# Patient Record
Sex: Female | Born: 1975 | Race: Asian | Hispanic: No | Marital: Married | Smoking: Never smoker
Health system: Southern US, Community
[De-identification: ages and names within clinical notes are randomized; demographics above are authoritative.]

## PROBLEM LIST (undated history)

## (undated) DIAGNOSIS — G629 Polyneuropathy, unspecified: Secondary | ICD-10-CM

## (undated) DIAGNOSIS — T7840XA Allergy, unspecified, initial encounter: Secondary | ICD-10-CM

## (undated) DIAGNOSIS — M1990 Unspecified osteoarthritis, unspecified site: Secondary | ICD-10-CM

## (undated) DIAGNOSIS — I10 Essential (primary) hypertension: Secondary | ICD-10-CM

## (undated) DIAGNOSIS — I82409 Acute embolism and thrombosis of unspecified deep veins of unspecified lower extremity: Secondary | ICD-10-CM

## (undated) DIAGNOSIS — E109 Type 1 diabetes mellitus without complications: Secondary | ICD-10-CM

## (undated) DIAGNOSIS — E079 Disorder of thyroid, unspecified: Secondary | ICD-10-CM

## (undated) DIAGNOSIS — E785 Hyperlipidemia, unspecified: Secondary | ICD-10-CM

## (undated) DIAGNOSIS — E559 Vitamin D deficiency, unspecified: Secondary | ICD-10-CM

## (undated) DIAGNOSIS — G43909 Migraine, unspecified, not intractable, without status migrainosus: Secondary | ICD-10-CM

## (undated) HISTORY — DX: Unspecified osteoarthritis, unspecified site: M19.90

## (undated) HISTORY — PX: OTHER SURGICAL HISTORY: SHX169

## (undated) HISTORY — DX: Hyperlipidemia, unspecified: E78.5

## (undated) HISTORY — DX: Disorder of thyroid, unspecified: E07.9

## (undated) HISTORY — DX: Type 1 diabetes mellitus without complications: E10.9

## (undated) HISTORY — DX: Allergy, unspecified, initial encounter: T78.40XA

---

## 2008-09-10 ENCOUNTER — Emergency Department (HOSPITAL_BASED_OUTPATIENT_CLINIC_OR_DEPARTMENT_OTHER): Admission: EM | Admit: 2008-09-10 | Discharge: 2008-09-11 | Payer: Self-pay | Admitting: Emergency Medicine

## 2008-09-11 ENCOUNTER — Ambulatory Visit: Payer: Self-pay | Admitting: Diagnostic Radiology

## 2009-11-29 ENCOUNTER — Emergency Department (HOSPITAL_BASED_OUTPATIENT_CLINIC_OR_DEPARTMENT_OTHER): Admission: EM | Admit: 2009-11-29 | Discharge: 2009-11-29 | Payer: Self-pay | Admitting: Emergency Medicine

## 2010-08-07 ENCOUNTER — Emergency Department (HOSPITAL_BASED_OUTPATIENT_CLINIC_OR_DEPARTMENT_OTHER): Payer: Medicare Other

## 2010-08-07 ENCOUNTER — Emergency Department (HOSPITAL_BASED_OUTPATIENT_CLINIC_OR_DEPARTMENT_OTHER)
Admission: EM | Admit: 2010-08-07 | Discharge: 2010-08-07 | Disposition: A | Discharge status: Home / Self Care | Payer: Medicare Other | Attending: Emergency Medicine | Admitting: Emergency Medicine

## 2010-08-07 DIAGNOSIS — E785 Hyperlipidemia, unspecified: Secondary | ICD-10-CM | POA: Insufficient documentation

## 2010-08-07 DIAGNOSIS — R7309 Other abnormal glucose: Secondary | ICD-10-CM

## 2010-08-07 DIAGNOSIS — R05 Cough: Secondary | ICD-10-CM

## 2010-08-07 DIAGNOSIS — E1169 Type 2 diabetes mellitus with other specified complication: Secondary | ICD-10-CM | POA: Insufficient documentation

## 2010-08-07 DIAGNOSIS — E860 Dehydration: Secondary | ICD-10-CM | POA: Insufficient documentation

## 2010-08-07 DIAGNOSIS — J069 Acute upper respiratory infection, unspecified: Secondary | ICD-10-CM | POA: Insufficient documentation

## 2010-08-07 LAB — DIFFERENTIAL
Basophils Relative: 0 % (ref 0–1)
Eosinophils Relative: 1 % (ref 0–5)
Lymphocytes Relative: 8 % — ABNORMAL LOW (ref 12–46)
Monocytes Absolute: 0.7 10*3/uL (ref 0.1–1.0)
Monocytes Relative: 8 % (ref 3–12)
Neutro Abs: 7.1 10*3/uL (ref 1.7–7.7)

## 2010-08-07 LAB — CBC
HCT: 37.7 % (ref 36.0–46.0)
Hemoglobin: 12.9 g/dL (ref 12.0–15.0)
MCH: 31.9 pg (ref 26.0–34.0)
MCHC: 34.2 g/dL (ref 30.0–36.0)
RDW: 11.3 % — ABNORMAL LOW (ref 11.5–15.5)

## 2010-08-07 LAB — BASIC METABOLIC PANEL
CO2: 27 mEq/L (ref 19–32)
Calcium: 9.3 mg/dL (ref 8.4–10.5)
Chloride: 99 mEq/L (ref 96–112)
GFR calc Af Amer: >60 mL/min (ref >60)
Glucose, Bld: 192 mg/dL — ABNORMAL HIGH (ref 70–99)
Potassium: 3.6 mEq/L (ref 3.5–5.1)
Sodium: 140 mEq/L (ref 135–145)

## 2010-08-07 LAB — URINALYSIS, ROUTINE W REFLEX MICROSCOPIC
Glucose, UA: NEGATIVE mg/dL
Hgb urine dipstick: NEGATIVE
Ketones, ur: NEGATIVE mg/dL
Protein, ur: NEGATIVE mg/dL
Urobilinogen, UA: 1 mg/dL (ref 0.0–1.0)

## 2010-08-09 LAB — BASIC METABOLIC PANEL
CO2: 24 mEq/L (ref 19–32)
Calcium: 8.7 mg/dL (ref 8.4–10.5)
Chloride: 100 mEq/L (ref 96–112)
GFR calc Af Amer: >60 mL/min (ref >60)
Potassium: 4.4 mEq/L (ref 3.5–5.1)
Sodium: 136 mEq/L (ref 135–145)

## 2010-08-09 LAB — URINALYSIS, ROUTINE W REFLEX MICROSCOPIC
Glucose, UA: >1000 mg/dL — AB
Ketones, ur: >80 mg/dL — AB
Leukocytes, UA: NEGATIVE
pH: 6 (ref 5.0–8.0)

## 2010-08-09 LAB — CBC
HCT: 36.4 % (ref 36.0–46.0)
Hemoglobin: 12.3 g/dL (ref 12.0–15.0)
MCH: 32 pg (ref 26.0–34.0)
RBC: 3.83 MIL/uL — ABNORMAL LOW (ref 3.87–5.11)

## 2010-08-09 LAB — DIFFERENTIAL
Eosinophils Absolute: 0.1 10*3/uL (ref 0.0–0.7)
Lymphs Abs: 1.2 10*3/uL (ref 0.7–4.0)
Monocytes Absolute: 0.3 10*3/uL (ref 0.1–1.0)
Monocytes Relative: 4 % (ref 3–12)
Neutrophils Relative %: 79 % — ABNORMAL HIGH (ref 43–77)

## 2010-08-09 LAB — URINE MICROSCOPIC-ADD ON

## 2010-09-02 LAB — URINE MICROSCOPIC-ADD ON

## 2010-09-02 LAB — DIFFERENTIAL
Basophils Relative: 0 % (ref 0–1)
Eosinophils Absolute: 0.3 10*3/uL (ref 0.0–0.7)
Monocytes Absolute: 0.5 10*3/uL (ref 0.1–1.0)
Monocytes Relative: 6 % (ref 3–12)

## 2010-09-02 LAB — CBC
Hemoglobin: 13.3 g/dL (ref 12.0–15.0)
MCHC: 34 g/dL (ref 30.0–36.0)
MCV: 95.9 fL (ref 78.0–100.0)
RBC: 4.07 MIL/uL (ref 3.87–5.11)

## 2010-09-02 LAB — BASIC METABOLIC PANEL
CO2: 27 mEq/L (ref 19–32)
Chloride: 99 mEq/L (ref 96–112)
GFR calc Af Amer: >60 mL/min (ref >60)
Sodium: 135 mEq/L (ref 135–145)

## 2010-09-02 LAB — URINALYSIS, ROUTINE W REFLEX MICROSCOPIC
Glucose, UA: >1000 mg/dL — AB
Hgb urine dipstick: NEGATIVE
Protein, ur: NEGATIVE mg/dL

## 2010-09-02 LAB — GLUCOSE, CAPILLARY: Glucose-Capillary: 312 mg/dL — ABNORMAL HIGH (ref 70–99)

## 2010-09-02 LAB — WET PREP, GENITAL: Trich, Wet Prep: NONE SEEN

## 2012-10-31 ENCOUNTER — Encounter (HOSPITAL_BASED_OUTPATIENT_CLINIC_OR_DEPARTMENT_OTHER): Payer: Self-pay | Admitting: *Deleted

## 2012-10-31 ENCOUNTER — Emergency Department (HOSPITAL_BASED_OUTPATIENT_CLINIC_OR_DEPARTMENT_OTHER)
Admission: EM | Admit: 2012-10-31 | Discharge: 2012-10-31 | Disposition: A | Discharge status: Home / Self Care | Payer: Medicare Other | Attending: Emergency Medicine | Admitting: Emergency Medicine

## 2012-10-31 DIAGNOSIS — Z3202 Encounter for pregnancy test, result negative: Secondary | ICD-10-CM | POA: Insufficient documentation

## 2012-10-31 DIAGNOSIS — Z794 Long term (current) use of insulin: Secondary | ICD-10-CM | POA: Insufficient documentation

## 2012-10-31 DIAGNOSIS — R109 Unspecified abdominal pain: Secondary | ICD-10-CM | POA: Insufficient documentation

## 2012-10-31 DIAGNOSIS — R350 Frequency of micturition: Secondary | ICD-10-CM | POA: Insufficient documentation

## 2012-10-31 DIAGNOSIS — R739 Hyperglycemia, unspecified: Secondary | ICD-10-CM

## 2012-10-31 DIAGNOSIS — Z79899 Other long term (current) drug therapy: Secondary | ICD-10-CM | POA: Insufficient documentation

## 2012-10-31 DIAGNOSIS — E1169 Type 2 diabetes mellitus with other specified complication: Secondary | ICD-10-CM | POA: Insufficient documentation

## 2012-10-31 LAB — CBC WITH DIFFERENTIAL/PLATELET
HCT: 35 % — ABNORMAL LOW (ref 36.0–46.0)
Hemoglobin: 12 g/dL (ref 12.0–15.0)
Lymphocytes Relative: 23 % (ref 12–46)
Monocytes Absolute: 0.5 10*3/uL (ref 0.1–1.0)
Monocytes Relative: 7 % (ref 3–12)
Neutro Abs: 4.8 10*3/uL (ref 1.7–7.7)
WBC: 7 10*3/uL (ref 4.0–10.5)

## 2012-10-31 LAB — BASIC METABOLIC PANEL
BUN: 10 mg/dL (ref 6–23)
CO2: 27 mEq/L (ref 19–32)
Chloride: 100 mEq/L (ref 96–112)
Creatinine, Ser: 0.6 mg/dL (ref 0.50–1.10)

## 2012-10-31 LAB — URINALYSIS, ROUTINE W REFLEX MICROSCOPIC
Bilirubin Urine: NEGATIVE
Hgb urine dipstick: NEGATIVE
Specific Gravity, Urine: 1.041 — ABNORMAL HIGH (ref 1.005–1.030)
Urobilinogen, UA: 1 mg/dL (ref 0.0–1.0)
pH: 8 (ref 5.0–8.0)

## 2012-10-31 LAB — GLUCOSE, CAPILLARY: Glucose-Capillary: 310 mg/dL — ABNORMAL HIGH (ref 70–99)

## 2012-10-31 MED ORDER — SODIUM CHLORIDE 0.9 % IV BOLUS (SEPSIS)
1000.0000 mL | Freq: Once | INTRAVENOUS | Status: AC
  Administered 2012-10-31: 1000 mL via INTRAVENOUS

## 2012-10-31 NOTE — ED Notes (Signed)
CBG - 310 

## 2012-10-31 NOTE — ED Notes (Signed)
Blood sugar is out of control. She has had an insulin pump for over 10 years. Cannot get her sugar lower than 321 with Insulin. She is having abdominal pain, urinary frequency and increased thirst.

## 2012-10-31 NOTE — ED Provider Notes (Signed)
History     CSN: [id number]  Arrival date & time 10/31/12  2002   First MD Initiated Contact with Patient 10/31/12 2051      Chief Complaint  Patient presents with  . Hyperglycemia    (Consider location/radiation/quality/duration/timing/severity/associated sxs/prior treatment) Patient is a 37 y.o. female presenting with hyperglycemia.  Hyperglycemia  Pt with history of DM on an insulin pump is usually well controlled but reports spiking sugar readings recently since travelling here from [place] for her daughter's high school graduation. She states has been in the 500s, improves temporarily but then back up again. She changed her pump site and insulin yesterday but still in the 300s today. She denies any fever, nausea or vomiting but reports occasional cramping lower abdominal pain and increased urination.   Past Medical History  Diagnosis Date  . Diabetes mellitus without complication     History reviewed. No pertinent past surgical history.  No family history on file.  History  Substance Use Topics  . Smoking status: Never Smoker   . Smokeless tobacco: Not on file  . Alcohol Use: No    OB History   Grav Para Term Preterm Abortions TAB SAB Ect Mult Living                  Review of Systems All other systems reviewed and are negative except as noted in HPI.   Allergies  Review of patient's allergies indicates no known allergies.  Home Medications   Current Outpatient Rx  Name  Route  Sig  Dispense  Refill  . GABAPENTIN, PHN, PO   Oral   Take by mouth.         . insulin regular (NOVOLIN R,HUMULIN R) 100 units/mL injection   Subcutaneous   Inject into the skin 3 (three) times daily before meals.           There were no vitals taken for this visit.  Physical Exam  Nursing note and vitals reviewed. Constitutional: She is oriented to person, place, and time. She appears well-developed and well-nourished.  HENT:  Head: Normocephalic and atraumatic.   Eyes: EOM are normal. Pupils are equal, round, and reactive to light.  Neck: Normal range of motion. Neck supple.  Cardiovascular: Normal rate, normal heart sounds and intact distal pulses.   Pulmonary/Chest: Effort normal and breath sounds normal.  Abdominal: Bowel sounds are normal. She exhibits no distension. There is no tenderness.  Musculoskeletal: Normal range of motion. She exhibits no edema and no tenderness.  Neurological: She is alert and oriented to person, place, and time. She has normal strength. No cranial nerve deficit or sensory deficit.  Skin: Skin is warm and dry. No rash noted.  Psychiatric: She has a normal mood and affect.    ED Course  Procedures (including critical care time)  Labs Reviewed  URINALYSIS, ROUTINE W REFLEX MICROSCOPIC - Abnormal; Notable for the following:    Specific Gravity, Urine 1.041 (*)    Glucose, UA >1000 (*)    All other components within normal limits  GLUCOSE, CAPILLARY - Abnormal; Notable for the following:    Glucose-Capillary 310 (*)    All other components within normal limits  CBC WITH DIFFERENTIAL - Abnormal; Notable for the following:    RBC 3.71 (*)    HCT 35.0 (*)    All other components within normal limits  BASIC METABOLIC PANEL - Abnormal; Notable for the following:    Glucose, Bld 301 (*)    All other components  within normal limits  GLUCOSE, CAPILLARY - Abnormal; Notable for the following:    Glucose-Capillary 159 (*)    All other components within normal limits  PREGNANCY, URINE  URINE MICROSCOPIC-ADD ON   No results found.   1. Hyperglycemia       MDM  CBG elevated, no DKA, her urine is concentrated but no UTI. Given IVF and continued insulin pump with good improvement in her CBG reading. Suspect dehydration complicating her diabetes management.         Charles B. Bernette Mayers, MD 10/31/12 2220

## 2012-12-21 ENCOUNTER — Emergency Department (HOSPITAL_BASED_OUTPATIENT_CLINIC_OR_DEPARTMENT_OTHER): Payer: Medicare Other

## 2012-12-21 ENCOUNTER — Emergency Department (HOSPITAL_BASED_OUTPATIENT_CLINIC_OR_DEPARTMENT_OTHER)
Admission: EM | Admit: 2012-12-21 | Discharge: 2012-12-21 | Disposition: A | Discharge status: Home / Self Care | Payer: Medicare Other | Attending: Emergency Medicine | Admitting: Emergency Medicine

## 2012-12-21 ENCOUNTER — Encounter (HOSPITAL_BASED_OUTPATIENT_CLINIC_OR_DEPARTMENT_OTHER): Payer: Self-pay | Admitting: *Deleted

## 2012-12-21 DIAGNOSIS — J3489 Other specified disorders of nose and nasal sinuses: Secondary | ICD-10-CM | POA: Insufficient documentation

## 2012-12-21 DIAGNOSIS — R05 Cough: Secondary | ICD-10-CM | POA: Insufficient documentation

## 2012-12-21 DIAGNOSIS — J329 Chronic sinusitis, unspecified: Secondary | ICD-10-CM | POA: Insufficient documentation

## 2012-12-21 DIAGNOSIS — E119 Type 2 diabetes mellitus without complications: Secondary | ICD-10-CM | POA: Insufficient documentation

## 2012-12-21 DIAGNOSIS — Z794 Long term (current) use of insulin: Secondary | ICD-10-CM | POA: Insufficient documentation

## 2012-12-21 DIAGNOSIS — R059 Cough, unspecified: Secondary | ICD-10-CM | POA: Insufficient documentation

## 2012-12-21 MED ORDER — SULFAMETHOXAZOLE-TRIMETHOPRIM 800-160 MG PO TABS: 1.0000 | ORAL_TABLET | Freq: Two times a day (BID) | ORAL | Status: DC

## 2012-12-21 MED ORDER — TRAMADOL HCL 50 MG PO TABS: 50.0000 mg | ORAL_TABLET | Freq: Four times a day (QID) | ORAL | Status: DC | PRN

## 2012-12-21 MED ORDER — GUAIFENESIN-CODEINE 100-10 MG/5ML PO SOLN: 5.0000 mL | Freq: Three times a day (TID) | ORAL | Status: DC | PRN

## 2012-12-21 NOTE — ED Provider Notes (Signed)
Medical screening examination/treatment/procedure(s) were performed by non-physician practitioner and as supervising physician I was immediately available for consultation/collaboration.  Geoffery Lyons, MD 12/21/12 [phone]

## 2012-12-21 NOTE — ED Notes (Signed)
Non productive cough x 10 days. Worse at night. States she thinks she has a cold. laryngitis.

## 2012-12-21 NOTE — ED Provider Notes (Signed)
CSN: [id number]     Arrival date & time 12/21/12  1611 History     First MD Initiated Contact with Patient 12/21/12 1717     Chief Complaint  Patient presents with  . URI   (Consider location/radiation/quality/duration/timing/severity/associated sxs/prior Treatment) HPI Comments: Patient is a 37 year old female with a past medical history of diabetes who presents with a 10 day history of cough and sinus pressure. Symptoms started gradually and progressively worsened since the onset. Patient has tried OTC cough suppressant without relief. No aggravating/alleviating factors. No other associated symptoms.   Patient is a 37 y.o. female presenting with URI.  URI Presenting symptoms: congestion and cough     Past Medical History  Diagnosis Date  . Diabetes mellitus without complication    History reviewed. No pertinent past surgical history. No family history on file. History  Substance Use Topics  . Smoking status: Never Smoker   . Smokeless tobacco: Not on file  . Alcohol Use: No   OB History   Grav Para Term Preterm Abortions TAB SAB Ect Mult Living                 Review of Systems  HENT: Positive for congestion and sinus pressure.   Respiratory: Positive for cough.   All other systems reviewed and are negative.    Allergies  Review of patient's allergies indicates no known allergies.  Home Medications   Current Outpatient Rx  Name  Route  Sig  Dispense  Refill  . amitriptyline (ELAVIL) 10 MG tablet   Oral   Take 10 mg by mouth at bedtime.         Marland Kitchen GABAPENTIN, PHN, PO   Oral   Take by mouth.         . insulin regular (NOVOLIN R,HUMULIN R) 100 units/mL injection   Subcutaneous   Inject into the skin 3 (three) times daily before meals.          BP 112/59  Pulse 88  Temp(Src) 98.9 F (37.2 C) (Oral)  Resp 20  Ht 4\' 10"  (1.473 m)  Wt 105 lb (47.628 kg)  BMI 21.95 kg/m2  SpO2 100% Physical Exam  Nursing note and vitals reviewed. Constitutional:  She is oriented to person, place, and time. She appears well-developed and well-nourished. No distress.  Patient coughing throughout exam and interview.   HENT:  Head: Normocephalic and atraumatic.  Mouth/Throat: Oropharynx is clear and moist. No oropharyngeal exudate.  Maxillary sinus tenderness to palpation.   Eyes: Conjunctivae and EOM are normal.  Neck: Normal range of motion.  Cardiovascular: Normal rate and regular rhythm.  Exam reveals no gallop and no friction rub.   No murmur heard. Pulmonary/Chest: Effort normal and breath sounds normal. She has no wheezes. She has no rales. She exhibits no tenderness.  Abdominal: Soft. She exhibits no distension. There is no tenderness. There is no rebound.  Musculoskeletal: Normal range of motion.  Lymphadenopathy:    She has no cervical adenopathy.  Neurological: She is alert and oriented to person, place, and time. Coordination normal.  Speech is goal-oriented. Moves limbs without ataxia.   Skin: Skin is warm and dry.  Psychiatric: She has a normal mood and affect. Her behavior is normal.    ED Course   Procedures (including critical care time)  Labs Reviewed - No data to display Dg Chest 2 View  12/21/2012   *RADIOLOGY REPORT*  Clinical Data: Cough and congestion for 10 days  CHEST - 2  VIEW  Comparison: 08/07/2010  Findings: The heart size and mediastinal contours are within normal limits.  Both lungs are clear.  The visualized skeletal structures are unremarkable.  IMPRESSION: Negative exam.   Original Report Authenticated By: Signa Kell, M.D.   1. Sinusitis     MDM  5:49 PM Patient will be treated for sinusitis with bactrim. Vitals stable and patient afebrile. I will also prescribe tramadol for pain and a cough suppressant. Patient is PERC negative. Patient instructed to return with worsening or concerning symptoms.   Emilia Beck, PA-C 12/21/12 1756

## 2013-06-04 DIAGNOSIS — IMO0002 Reserved for concepts with insufficient information to code with codable children: Secondary | ICD-10-CM | POA: Diagnosis not present

## 2013-06-04 DIAGNOSIS — E782 Mixed hyperlipidemia: Secondary | ICD-10-CM | POA: Diagnosis not present

## 2013-06-04 DIAGNOSIS — E1065 Type 1 diabetes mellitus with hyperglycemia: Secondary | ICD-10-CM | POA: Diagnosis not present

## 2013-06-04 DIAGNOSIS — E559 Vitamin D deficiency, unspecified: Secondary | ICD-10-CM | POA: Diagnosis not present

## 2013-06-04 DIAGNOSIS — E1049 Type 1 diabetes mellitus with other diabetic neurological complication: Secondary | ICD-10-CM | POA: Diagnosis not present

## 2013-06-20 ENCOUNTER — Emergency Department (HOSPITAL_BASED_OUTPATIENT_CLINIC_OR_DEPARTMENT_OTHER): Payer: Medicare Other

## 2013-06-20 ENCOUNTER — Emergency Department (HOSPITAL_BASED_OUTPATIENT_CLINIC_OR_DEPARTMENT_OTHER)
Admission: EM | Admit: 2013-06-20 | Discharge: 2013-06-20 | Disposition: A | Discharge status: Home / Self Care | Payer: Medicare Other | Attending: Emergency Medicine | Admitting: Emergency Medicine

## 2013-06-20 ENCOUNTER — Encounter (HOSPITAL_BASED_OUTPATIENT_CLINIC_OR_DEPARTMENT_OTHER): Payer: Self-pay | Admitting: Emergency Medicine

## 2013-06-20 DIAGNOSIS — J209 Acute bronchitis, unspecified: Secondary | ICD-10-CM | POA: Diagnosis not present

## 2013-06-20 DIAGNOSIS — Z3202 Encounter for pregnancy test, result negative: Secondary | ICD-10-CM | POA: Diagnosis not present

## 2013-06-20 DIAGNOSIS — R000 Tachycardia, unspecified: Secondary | ICD-10-CM | POA: Diagnosis not present

## 2013-06-20 DIAGNOSIS — R630 Anorexia: Secondary | ICD-10-CM | POA: Insufficient documentation

## 2013-06-20 DIAGNOSIS — Z794 Long term (current) use of insulin: Secondary | ICD-10-CM | POA: Insufficient documentation

## 2013-06-20 DIAGNOSIS — R0789 Other chest pain: Secondary | ICD-10-CM | POA: Insufficient documentation

## 2013-06-20 DIAGNOSIS — E119 Type 2 diabetes mellitus without complications: Secondary | ICD-10-CM | POA: Insufficient documentation

## 2013-06-20 DIAGNOSIS — J111 Influenza due to unidentified influenza virus with other respiratory manifestations: Secondary | ICD-10-CM | POA: Diagnosis not present

## 2013-06-20 DIAGNOSIS — Z79899 Other long term (current) drug therapy: Secondary | ICD-10-CM | POA: Insufficient documentation

## 2013-06-20 LAB — URINALYSIS, ROUTINE W REFLEX MICROSCOPIC
Bilirubin Urine: NEGATIVE
GLUCOSE, UA: 250 mg/dL — AB
HGB URINE DIPSTICK: NEGATIVE
KETONES UR: NEGATIVE mg/dL
Leukocytes, UA: NEGATIVE
Nitrite: NEGATIVE
PROTEIN: 30 mg/dL — AB
Specific Gravity, Urine: 1.028 (ref 1.005–1.030)
UROBILINOGEN UA: 1 mg/dL (ref 0.0–1.0)
pH: 7.5 (ref 5.0–8.0)

## 2013-06-20 LAB — CBC WITH DIFFERENTIAL/PLATELET
BASOS PCT: 0 % (ref 0–1)
Basophils Absolute: 0 10*3/uL (ref 0.0–0.1)
EOS ABS: 0.1 10*3/uL (ref 0.0–0.7)
EOS PCT: 1 % (ref 0–5)
HCT: 36.3 % (ref 36.0–46.0)
Hemoglobin: 12.1 g/dL (ref 12.0–15.0)
LYMPHS ABS: 0.5 10*3/uL — AB (ref 0.7–4.0)
Lymphocytes Relative: 9 % — ABNORMAL LOW (ref 12–46)
MCH: 31.9 pg (ref 26.0–34.0)
MCHC: 33.3 g/dL (ref 30.0–36.0)
MCV: 95.8 fL (ref 78.0–100.0)
MONOS PCT: 12 % (ref 3–12)
Monocytes Absolute: 0.6 10*3/uL (ref 0.1–1.0)
NEUTROS PCT: 79 % — AB (ref 43–77)
Neutro Abs: 4.4 10*3/uL (ref 1.7–7.7)
PLATELETS: 210 10*3/uL (ref 150–400)
RBC: 3.79 MIL/uL — AB (ref 3.87–5.11)
RDW: 11.9 % (ref 11.5–15.5)
WBC: 5.5 10*3/uL (ref 4.0–10.5)

## 2013-06-20 LAB — URINE MICROSCOPIC-ADD ON

## 2013-06-20 LAB — BASIC METABOLIC PANEL
BUN: 12 mg/dL (ref 6–23)
CALCIUM: 8.4 mg/dL (ref 8.4–10.5)
CO2: 25 mEq/L (ref 19–32)
Chloride: 98 mEq/L (ref 96–112)
Creatinine, Ser: 0.6 mg/dL (ref 0.50–1.10)
GLUCOSE: 272 mg/dL — AB (ref 70–99)
Potassium: 3.9 mEq/L (ref 3.7–5.3)
SODIUM: 136 meq/L — AB (ref 137–147)

## 2013-06-20 LAB — GLUCOSE, CAPILLARY: Glucose-Capillary: 262 mg/dL — ABNORMAL HIGH (ref 70–99)

## 2013-06-20 LAB — PREGNANCY, URINE: Preg Test, Ur: NEGATIVE

## 2013-06-20 LAB — RAPID STREP SCREEN (MED CTR MEBANE ONLY): Streptococcus, Group A Screen (Direct): NEGATIVE

## 2013-06-20 MED ORDER — IBUPROFEN 800 MG PO TABS
800.0000 mg | ORAL_TABLET | Freq: Once | ORAL | Status: AC
  Administered 2013-06-20: 800 mg via ORAL

## 2013-06-20 MED ORDER — HYDROCOD POLST-CHLORPHEN POLST 10-8 MG/5ML PO LQCR
5.0000 mL | Freq: Once | ORAL | Status: AC
  Administered 2013-06-20: 5 mL via ORAL
  Filled 2013-06-20: qty 5

## 2013-06-20 MED ORDER — SODIUM CHLORIDE 0.9 % IV BOLUS (SEPSIS)
1000.0000 mL | Freq: Once | INTRAVENOUS | Status: AC
  Administered 2013-06-20: 1000 mL via INTRAVENOUS

## 2013-06-20 MED ORDER — IBUPROFEN 800 MG PO TABS
ORAL_TABLET | ORAL | Status: AC
  Filled 2013-06-20: qty 1

## 2013-06-20 MED ORDER — OSELTAMIVIR PHOSPHATE 75 MG PO CAPS
75.0000 mg | ORAL_CAPSULE | Freq: Two times a day (BID) | ORAL | Status: DC
Start: 2013-06-20 — End: 2015-05-22

## 2013-06-20 MED ORDER — HYDROCOD POLST-CHLORPHEN POLST 10-8 MG/5ML PO LQCR: 5.0000 mL | Freq: Two times a day (BID) | ORAL | Status: DC | PRN

## 2013-06-20 NOTE — Discharge Instructions (Signed)
Influenza, Adult Influenza ("the flu") is a viral infection of the respiratory tract. It occurs more often in winter months because people spend more time in close contact with one another. Influenza can make you feel very sick. Influenza easily spreads from person to person (contagious). CAUSES  Influenza is caused by a virus that infects the respiratory tract. You can catch the virus by breathing in droplets from an infected person's cough or sneeze. You can also catch the virus by touching something that was recently contaminated with the virus and then touching your mouth, nose, or eyes. SYMPTOMS  Symptoms typically last 4 to 10 days and may include:  Fever.  Chills.  Headache, body aches, and muscle aches.  Sore throat.  Chest discomfort and cough.  Poor appetite.  Weakness or feeling tired.  Dizziness.  Nausea or vomiting. DIAGNOSIS  Diagnosis of influenza is often made based on your history and a physical exam. A nose or throat swab test can be done to confirm the diagnosis. RISKS AND COMPLICATIONS You may be at risk for a more severe case of influenza if you smoke cigarettes, have diabetes, have chronic heart disease (such as heart failure) or lung disease (such as asthma), or if you have a weakened immune system. Elderly people and pregnant women are also at risk for more serious infections. The most common complication of influenza is a lung infection (pneumonia). Sometimes, this complication can require emergency medical care and may be life-threatening. PREVENTION  An annual influenza vaccination (flu shot) is the best way to avoid getting influenza. An annual flu shot is now routinely recommended for all adults in the [place] TREATMENT  In mild cases, influenza goes away on its own. Treatment is directed at relieving symptoms. For more severe cases, your caregiver may prescribe antiviral medicines to shorten the sickness. Antibiotic medicines are not effective, because the  infection is caused by a virus, not by bacteria. HOME CARE INSTRUCTIONS  Only take over-the-counter or prescription medicines for pain, discomfort, or fever as directed by your caregiver.  Use a cool mist humidifier to make breathing easier.  Get plenty of rest until your temperature returns to normal. This usually takes 3 to 4 days.  Drink enough fluids to keep your urine clear or pale yellow.  Cover your mouth and nose when coughing or sneezing, and wash your hands well to avoid spreading the virus.  Stay home from work or school until your fever has been gone for at least 1 full day. SEEK MEDICAL CARE IF:   You have chest pain or a deep cough that worsens or produces more mucus.  You have nausea, vomiting, or diarrhea. SEEK IMMEDIATE MEDICAL CARE IF:   You have difficulty breathing, shortness of breath, or your skin or nails turn bluish.  You have severe neck pain or stiffness.  You have a severe headache, facial pain, or earache.  You have a worsening or recurring fever.  You have nausea or vomiting that cannot be controlled. MAKE SURE YOU:  Understand these instructions.  Will watch your condition.  Will get help right away if you are not doing well or get worse. Document Released: 05/07/2000 Document Revised: 11/09/2011 Document Reviewed: 08/09/2011 [facility] Patient Information 2014 [place], [place].  Influenza, Adult Influenza (flu) is an infection in the mouth, nose, and throat (respiratory tract) caused by a virus. The flu can make you feel very ill. Influenza spreads easily from person to person (contagious).  HOME CARE   Only take medicines as  told by your doctor.  Use a cool mist humidifier to make breathing easier.  Get plenty of rest until your fever goes away. This usually takes 3 to 4 days.  Drink enough fluids to keep your pee (urine) clear or pale yellow.  Cover your mouth and nose when you cough or sneeze.  Wash your hands well to avoid  spreading the flu.  Stay home from work or school until your fever has been gone for at least 1 full day.  Get a flu shot every year. GET HELP RIGHT AWAY IF:   You have trouble breathing or feel short of breath.  Your skin or nails turn blue.  You have severe neck pain or stiffness.  You have a severe headache, facial pain, or earache.  Your fever gets worse or keeps coming back.  You feel sick to your stomach (nauseous), throw up (vomit), or have watery poop (diarrhea).  You have chest pain.  You have a deep cough that gets worse, or you cough up more thick spit (mucus). MAKE SURE YOU:   Understand these instructions.  Will watch your condition.  Will get help right away if you are not doing well or get worse. Document Released: 02/17/2008 Document Revised: 11/09/2011 Document Reviewed: 08/09/2011 [facility] Patient Information 2014 [place], [place].

## 2013-06-20 NOTE — ED Notes (Signed)
Flu like sxs since returning from cruise. Cough, fever, body aches, and sore throat.

## 2013-06-20 NOTE — ED Provider Notes (Signed)
CSN: [id number]     Arrival date & time 06/20/13  1928 History   First MD Initiated Contact with Patient 06/20/13 1951     Chief Complaint  Patient presents with  . Cough   (Consider location/radiation/quality/duration/timing/severity/associated sxs/prior Treatment) HPI Comments: Patient is 38 year old female with history of IDDM who presents with a 2 day history of fever,chills, headache, sore throat, cough without sputum production, chest pain with cough, and body aches.  She states that she started with a scrathy throat when she was driving home from a cruise 2 days ago - since then she has noticed fever and chills with fever as high as 101.5.  She states that she has also noted that her blood sugars are also elevated druing this time.  She has been covering herself with novolog insulin but they have been still high.  She denies nausea, vomiting, diarrhea, abdominal pain but reports decrease in appetite though she is able to tolerate fluids.  Patient is a 38 y.o. female presenting with cough. The history is provided by the patient. No language interpreter was used.  Cough Cough characteristics:  Dry and non-productive Severity:  Moderate Onset quality:  Gradual Duration:  2 days Timing:  Constant Progression:  Worsening Chronicity:  New Smoker: no   Context: sick contacts and upper respiratory infection   Relieved by:  Nothing Worsened by:  Nothing tried Ineffective treatments:  None tried Associated symptoms: chest pain, chills, fever, headaches, myalgias and sore throat   Associated symptoms: no diaphoresis, no ear pain, no rash, no rhinorrhea, no shortness of breath, no weight loss and no wheezing     Past Medical History  Diagnosis Date  . Diabetes mellitus without complication    Past Surgical History  Procedure Laterality Date  . Insulin pump     History reviewed. No pertinent family history. History  Substance Use Topics  . Smoking status: Never Smoker   . Smokeless  tobacco: Not on file  . Alcohol Use: No   OB History   Grav Para Term Preterm Abortions TAB SAB Ect Mult Living                 Review of Systems  Constitutional: Positive for fever and chills. Negative for weight loss and diaphoresis.  HENT: Positive for sore throat. Negative for ear pain and rhinorrhea.   Respiratory: Positive for cough. Negative for shortness of breath and wheezing.   Cardiovascular: Positive for chest pain.  Musculoskeletal: Positive for myalgias.  Skin: Negative for rash.  Neurological: Positive for headaches.  All other systems reviewed and are negative.    Allergies  Review of patient's allergies indicates no known allergies.  Home Medications   Current Outpatient Rx  Name  Route  Sig  Dispense  Refill  . amitriptyline (ELAVIL) 10 MG tablet   Oral   Take 10 mg by mouth at bedtime.         Marland Kitchen GABAPENTIN, PHN, PO   Oral   Take by mouth.         . insulin regular (NOVOLIN R,HUMULIN R) 100 units/mL injection   Subcutaneous   Inject into the skin 3 (three) times daily before meals.         Marland Kitchen guaiFENesin-codeine 100-10 MG/5ML syrup   Oral   Take 5 mLs by mouth 3 (three) times daily as needed for cough.   120 mL   0   . sulfamethoxazole-trimethoprim (SEPTRA DS) 800-160 MG per tablet   Oral  Take 1 tablet by mouth every 12 (twelve) hours.   20 tablet   0   . traMADol (ULTRAM) 50 MG tablet   Oral   Take 1 tablet (50 mg total) by mouth every 6 (six) hours as needed for pain.   15 tablet   0    BP 117/63  Pulse 108  Temp(Src) 103.1 F (39.5 C) (Oral)  Resp 18  Ht 4\' 10"  (1.473 m)  Wt 103 lb (46.72 kg)  BMI 21.53 kg/m2  SpO2 98% Physical Exam  Nursing note and vitals reviewed. Constitutional: She is oriented to person, place, and time. She appears well-developed and well-nourished.  Ill appearing  HENT:  Head: Normocephalic and atraumatic.  Right Ear: External ear normal.  Left Ear: External ear normal.  Mouth/Throat:  Oropharynx is clear and moist. No oropharyngeal exudate.  Clear rhinorrhea - mild posterior erythema without exudate  Eyes: Conjunctivae are normal. Pupils are equal, round, and reactive to light. No scleral icterus.  Neck: Normal range of motion. Neck supple.  Cardiovascular: Regular rhythm and normal heart sounds.  Exam reveals no gallop and no friction rub.   No murmur heard. tachycardia  Pulmonary/Chest: Effort normal and breath sounds normal. No respiratory distress. She has no wheezes. She has no rales. She exhibits no tenderness.  Abdominal: Soft. Bowel sounds are normal. She exhibits no distension. There is no tenderness. There is no rebound and no guarding.  Musculoskeletal: Normal range of motion. She exhibits no edema and no tenderness.  Lymphadenopathy:    She has no cervical adenopathy.  Neurological: She is alert and oriented to person, place, and time. She exhibits normal muscle tone. Coordination normal.  Skin: Skin is warm and dry. No rash noted. No erythema. No pallor.  Psychiatric: She has a normal mood and affect. Her behavior is normal. Judgment and thought content normal.    ED Course  Procedures (including critical care time) Labs Review Labs Reviewed  GLUCOSE, CAPILLARY - Abnormal; Notable for the following:    Glucose-Capillary 262 (*)    All other components within normal limits  CBC WITH DIFFERENTIAL - Abnormal; Notable for the following:    RBC 3.79 (*)    Neutrophils Relative % 79 (*)    Lymphocytes Relative 9 (*)    Lymphs Abs 0.5 (*)    All other components within normal limits  BASIC METABOLIC PANEL - Abnormal; Notable for the following:    Sodium 136 (*)    Glucose, Bld 272 (*)    All other components within normal limits  URINALYSIS, ROUTINE W REFLEX MICROSCOPIC - Abnormal; Notable for the following:    APPearance CLOUDY (*)    Glucose, UA 250 (*)    Protein, ur 30 (*)    All other components within normal limits  URINE MICROSCOPIC-ADD ON -  Abnormal; Notable for the following:    Squamous Epithelial / LPF FEW (*)    All other components within normal limits  RAPID STREP SCREEN  CULTURE, GROUP A STREP  PREGNANCY, URINE   Imaging Review Dg Chest 2 View  06/20/2013   CLINICAL DATA:  Cough and fever and generalized aching  EXAM: CHEST  2 VIEW  COMPARISON:  DG CHEST 2 VIEW dated 12/21/2012  FINDINGS: The lungs are mildly hyperinflated. There is no focal infiltrate. There is no pleural effusion or pneumothorax. The cardiac silhouette and mediastinal structures are within the limits of normal. The observed portions of the bony thorax are normal.  IMPRESSION: There is hyperinflation  consistent with air trapping possibly secondary to acute bronchitis or to underlying reactive airway disease. There is no focal pneumonia.   Electronically Signed   By: David  Martinique   On: 06/20/2013 20:39    EKG Interpretation   None      Results for orders placed during the hospital encounter of 06/20/13  RAPID STREP SCREEN      Result Value Range   Streptococcus, Group A Screen (Direct) NEGATIVE  NEGATIVE  GLUCOSE, CAPILLARY      Result Value Range   Glucose-Capillary 262 (*) 70 - 99 mg/dL  CBC WITH DIFFERENTIAL      Result Value Range   WBC 5.5  4.0 - 10.5 K/uL   RBC 3.79 (*) 3.87 - 5.11 MIL/uL   Hemoglobin 12.1  12.0 - 15.0 g/dL   HCT 36.3  36.0 - 46.0 %   MCV 95.8  78.0 - 100.0 fL   MCH 31.9  26.0 - 34.0 pg   MCHC 33.3  30.0 - 36.0 g/dL   RDW 11.9  11.5 - 15.5 %   Platelets 210  150 - 400 K/uL   Neutrophils Relative % 79 (*) 43 - 77 %   Neutro Abs 4.4  1.7 - 7.7 K/uL   Lymphocytes Relative 9 (*) 12 - 46 %   Lymphs Abs 0.5 (*) 0.7 - 4.0 K/uL   Monocytes Relative 12  3 - 12 %   Monocytes Absolute 0.6  0.1 - 1.0 K/uL   Eosinophils Relative 1  0 - 5 %   Eosinophils Absolute 0.1  0.0 - 0.7 K/uL   Basophils Relative 0  0 - 1 %   Basophils Absolute 0.0  0.0 - 0.1 K/uL  BASIC METABOLIC PANEL      Result Value Range   Sodium 136 (*) 137 -  147 mEq/L   Potassium 3.9  3.7 - 5.3 mEq/L   Chloride 98  96 - 112 mEq/L   CO2 25  19 - 32 mEq/L   Glucose, Bld 272 (*) 70 - 99 mg/dL   BUN 12  6 - 23 mg/dL   Creatinine, Ser 0.60  0.50 - 1.10 mg/dL   Calcium 8.4  8.4 - 10.5 mg/dL   GFR calc non Af Amer >90  >90 mL/min   GFR calc Af Amer >90  >90 mL/min  URINALYSIS, ROUTINE W REFLEX MICROSCOPIC      Result Value Range   Color, Urine YELLOW  YELLOW   APPearance CLOUDY (*) CLEAR   Specific Gravity, Urine 1.028  1.005 - 1.030   pH 7.5  5.0 - 8.0   Glucose, UA 250 (*) NEGATIVE mg/dL   Hgb urine dipstick NEGATIVE  NEGATIVE   Bilirubin Urine NEGATIVE  NEGATIVE   Ketones, ur NEGATIVE  NEGATIVE mg/dL   Protein, ur 30 (*) NEGATIVE mg/dL   Urobilinogen, UA 1.0  0.0 - 1.0 mg/dL   Nitrite NEGATIVE  NEGATIVE   Leukocytes, UA NEGATIVE  NEGATIVE  PREGNANCY, URINE      Result Value Range   Preg Test, Ur NEGATIVE  NEGATIVE  URINE MICROSCOPIC-ADD ON      Result Value Range   Squamous Epithelial / LPF FEW (*) RARE   Bacteria, UA RARE  RARE   Urine-Other MUCOUS PRESENT     Dg Chest 2 View  06/20/2013   CLINICAL DATA:  Cough and fever and generalized aching  EXAM: CHEST  2 VIEW  COMPARISON:  DG CHEST 2 VIEW dated 12/21/2012  FINDINGS: The  lungs are mildly hyperinflated. There is no focal infiltrate. There is no pleural effusion or pneumothorax. The cardiac silhouette and mediastinal structures are within the limits of normal. The observed portions of the bony thorax are normal.  IMPRESSION: There is hyperinflation consistent with air trapping possibly secondary to acute bronchitis or to underlying reactive airway disease. There is no focal pneumonia.   Electronically Signed   By: David  Martinique   On: 06/20/2013 20:39     MDM  Influenza  Patient with DM here with increase blood sugars and ILI, she is not in DKA and clinically is better after a liter of fluids - her HR is now down to 88 and regular.  She feels somewhat better.  Will start on tamiflu  and cough medication.  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  Idalia Needle Joelyn Oms, PA-C 06/20/13 2201

## 2013-06-20 NOTE — ED Provider Notes (Signed)
Medical screening examination/treatment/procedure(s) were performed by non-physician practitioner and as supervising physician I was immediately available for consultation/collaboration.  EKG Interpretation   None         Blanchie Dessert, MD 06/20/13 2207

## 2013-06-20 NOTE — ED Notes (Signed)
Pt last took nyquil around 4pm and has been adjusting her insulin pump since then.

## 2013-06-22 LAB — CULTURE, GROUP A STREP

## 2013-07-09 DIAGNOSIS — E782 Mixed hyperlipidemia: Secondary | ICD-10-CM | POA: Diagnosis not present

## 2013-07-09 DIAGNOSIS — IMO0002 Reserved for concepts with insufficient information to code with codable children: Secondary | ICD-10-CM | POA: Diagnosis not present

## 2013-07-09 DIAGNOSIS — E559 Vitamin D deficiency, unspecified: Secondary | ICD-10-CM | POA: Diagnosis not present

## 2013-07-09 DIAGNOSIS — E1065 Type 1 diabetes mellitus with hyperglycemia: Secondary | ICD-10-CM | POA: Diagnosis not present

## 2013-11-05 DIAGNOSIS — E782 Mixed hyperlipidemia: Secondary | ICD-10-CM | POA: Diagnosis not present

## 2013-11-05 DIAGNOSIS — E1065 Type 1 diabetes mellitus with hyperglycemia: Secondary | ICD-10-CM | POA: Diagnosis not present

## 2013-11-05 DIAGNOSIS — E1049 Type 1 diabetes mellitus with other diabetic neurological complication: Secondary | ICD-10-CM | POA: Diagnosis not present

## 2013-11-05 DIAGNOSIS — IMO0002 Reserved for concepts with insufficient information to code with codable children: Secondary | ICD-10-CM | POA: Diagnosis not present

## 2013-11-05 DIAGNOSIS — E559 Vitamin D deficiency, unspecified: Secondary | ICD-10-CM | POA: Diagnosis not present

## 2013-11-07 DIAGNOSIS — E559 Vitamin D deficiency, unspecified: Secondary | ICD-10-CM | POA: Diagnosis not present

## 2013-11-07 DIAGNOSIS — E782 Mixed hyperlipidemia: Secondary | ICD-10-CM | POA: Diagnosis not present

## 2013-11-07 DIAGNOSIS — IMO0002 Reserved for concepts with insufficient information to code with codable children: Secondary | ICD-10-CM | POA: Diagnosis not present

## 2013-11-07 DIAGNOSIS — E1065 Type 1 diabetes mellitus with hyperglycemia: Secondary | ICD-10-CM | POA: Diagnosis not present

## 2013-12-18 ENCOUNTER — Encounter (HOSPITAL_BASED_OUTPATIENT_CLINIC_OR_DEPARTMENT_OTHER): Payer: Self-pay | Admitting: Emergency Medicine

## 2013-12-18 ENCOUNTER — Emergency Department (HOSPITAL_BASED_OUTPATIENT_CLINIC_OR_DEPARTMENT_OTHER): Payer: Medicare Other

## 2013-12-18 ENCOUNTER — Emergency Department (HOSPITAL_BASED_OUTPATIENT_CLINIC_OR_DEPARTMENT_OTHER)
Admission: EM | Admit: 2013-12-18 | Discharge: 2013-12-19 | Disposition: A | Discharge status: Home / Self Care | Payer: Medicare Other | Attending: Emergency Medicine | Admitting: Emergency Medicine

## 2013-12-18 DIAGNOSIS — Z8669 Personal history of other diseases of the nervous system and sense organs: Secondary | ICD-10-CM | POA: Diagnosis not present

## 2013-12-18 DIAGNOSIS — R079 Chest pain, unspecified: Secondary | ICD-10-CM | POA: Insufficient documentation

## 2013-12-18 DIAGNOSIS — E1065 Type 1 diabetes mellitus with hyperglycemia: Secondary | ICD-10-CM | POA: Diagnosis not present

## 2013-12-18 DIAGNOSIS — Z79899 Other long term (current) drug therapy: Secondary | ICD-10-CM | POA: Diagnosis not present

## 2013-12-18 DIAGNOSIS — IMO0002 Reserved for concepts with insufficient information to code with codable children: Secondary | ICD-10-CM | POA: Diagnosis not present

## 2013-12-18 DIAGNOSIS — I10 Essential (primary) hypertension: Secondary | ICD-10-CM | POA: Insufficient documentation

## 2013-12-18 DIAGNOSIS — Z86718 Personal history of other venous thrombosis and embolism: Secondary | ICD-10-CM | POA: Diagnosis not present

## 2013-12-18 DIAGNOSIS — Z3202 Encounter for pregnancy test, result negative: Secondary | ICD-10-CM | POA: Insufficient documentation

## 2013-12-18 DIAGNOSIS — Z794 Long term (current) use of insulin: Secondary | ICD-10-CM | POA: Diagnosis not present

## 2013-12-18 DIAGNOSIS — R739 Hyperglycemia, unspecified: Secondary | ICD-10-CM

## 2013-12-18 DIAGNOSIS — Z792 Long term (current) use of antibiotics: Secondary | ICD-10-CM | POA: Insufficient documentation

## 2013-12-18 DIAGNOSIS — E109 Type 1 diabetes mellitus without complications: Secondary | ICD-10-CM | POA: Diagnosis not present

## 2013-12-18 DIAGNOSIS — R109 Unspecified abdominal pain: Secondary | ICD-10-CM | POA: Diagnosis not present

## 2013-12-18 DIAGNOSIS — R071 Chest pain on breathing: Secondary | ICD-10-CM | POA: Diagnosis not present

## 2013-12-18 DIAGNOSIS — R0789 Other chest pain: Secondary | ICD-10-CM | POA: Insufficient documentation

## 2013-12-18 HISTORY — DX: Essential (primary) hypertension: I10

## 2013-12-18 HISTORY — DX: Acute embolism and thrombosis of unspecified deep veins of unspecified lower extremity: I82.409

## 2013-12-18 HISTORY — DX: Vitamin D deficiency, unspecified: E55.9

## 2013-12-18 HISTORY — DX: Polyneuropathy, unspecified: G62.9

## 2013-12-18 LAB — CBC WITH DIFFERENTIAL/PLATELET
Basophils Absolute: 0 10*3/uL (ref 0.0–0.1)
Basophils Relative: 0 % (ref 0–1)
EOS ABS: 0.1 10*3/uL (ref 0.0–0.7)
Eosinophils Relative: 2 % (ref 0–5)
HCT: 37.8 % (ref 36.0–46.0)
Hemoglobin: 12.7 g/dL (ref 12.0–15.0)
LYMPHS ABS: 1.7 10*3/uL (ref 0.7–4.0)
LYMPHS PCT: 24 % (ref 12–46)
MCH: 32.1 pg (ref 26.0–34.0)
MCHC: 33.6 g/dL (ref 30.0–36.0)
MCV: 95.5 fL (ref 78.0–100.0)
Monocytes Absolute: 0.7 10*3/uL (ref 0.1–1.0)
Monocytes Relative: 9 % (ref 3–12)
NEUTROS PCT: 65 % (ref 43–77)
Neutro Abs: 4.7 10*3/uL (ref 1.7–7.7)
PLATELETS: 277 10*3/uL (ref 150–400)
RBC: 3.96 MIL/uL (ref 3.87–5.11)
RDW: 11.6 % (ref 11.5–15.5)
WBC: 7.2 10*3/uL (ref 4.0–10.5)

## 2013-12-18 LAB — URINALYSIS, ROUTINE W REFLEX MICROSCOPIC
Bilirubin Urine: NEGATIVE
HGB URINE DIPSTICK: NEGATIVE
Ketones, ur: NEGATIVE mg/dL
Leukocytes, UA: NEGATIVE
Nitrite: NEGATIVE
Protein, ur: NEGATIVE mg/dL
Urobilinogen, UA: 1 mg/dL (ref 0.0–1.0)
pH: 7 (ref 5.0–8.0)

## 2013-12-18 LAB — TROPONIN I

## 2013-12-18 LAB — URINE MICROSCOPIC-ADD ON

## 2013-12-18 LAB — BASIC METABOLIC PANEL
ANION GAP: 14 (ref 5–15)
BUN: 10 mg/dL (ref 6–23)
CO2: 26 meq/L (ref 19–32)
Calcium: 9.2 mg/dL (ref 8.4–10.5)
Chloride: 98 mEq/L (ref 96–112)
Creatinine, Ser: 0.6 mg/dL (ref 0.50–1.10)
GFR calc Af Amer: >90 mL/min (ref >90)
Glucose, Bld: 398 mg/dL — ABNORMAL HIGH (ref 70–99)
POTASSIUM: 3.8 meq/L (ref 3.7–5.3)
SODIUM: 138 meq/L (ref 137–147)

## 2013-12-18 LAB — CBG MONITORING, ED: GLUCOSE-CAPILLARY: 321 mg/dL — AB (ref 70–99)

## 2013-12-18 LAB — D-DIMER, QUANTITATIVE (NOT AT ARMC): D-Dimer, Quant: 0.47 ug/mL-FEU (ref 0.00–0.48)

## 2013-12-18 LAB — PREGNANCY, URINE: PREG TEST UR: NEGATIVE

## 2013-12-18 NOTE — ED Provider Notes (Signed)
CSN: [id number]     Arrival date & time 12/18/13  2149 History  This chart was scribed for Lisa Clonts, MD by Cathie Hoops, ED Scribe. The patient was seen in [facility]. The patient's care was started at 11:24 PM.       Chief Complaint  Patient presents with  . Chest Pain   HPI Comments: 38 year old female with diabetes, peripheral neuropathy, high blood pressure, DVT associated medication presents with epigastric pain radiating to left chest. Mild worsening with deep inspiration. No pulmonary embolism history however patient did have a DVT in the past but not currently on blood thinners. Patient denies exertional symptoms or diaphoresis. Patient currently has no pain. Patient has had intermittent pain for 3 days however Lisa Levine had fairly constant pain all day today described as discomfort/burning. Patient had a stress test within the past year and was told it was normal. No known heart disease or heart attack history. Patient has family history of cardiac.  The history is provided by the patient. No language interpreter was used.   HPI Comments: Lisa Levine is a 38 y.o. female who presents to the Emergency Department complaining of intermittent chest pain onset 2 days ago. Pt states her chest pain felt like heartburn. Pt states her blood sugar was high on Sunday, regulated on Monday, and has been fluctuating today. Pt states Lisa Levine has associated dizziness. Pt denies vision changes, sore throat, fever, chills, vomiting, diarrhea. cough or nausea.   Pt states Lisa Levine has diabetic neuropathy. Pt states Lisa Levine had a thigh blood clot. Pt reports hypertension and states  states Lisa Levine recently took a 12-hour road trip. Pt denies having any surgeries in the last 6 weeks. Pt denies any other medical issues. Pt denies breaking out in cold sweat during the chest pain. Pt states her PCP is in [place].  Pt reports having a stress test in November of 2014.    Past Medical History  Diagnosis Date  . Diabetes mellitus  without complication   . DVT (deep venous thrombosis)   . Peripheral neuropathy   . Hypertension   . Vitamin D deficiency    Past Surgical History  Procedure Laterality Date  . Insulin pump     No family history on file. History  Substance Use Topics  . Smoking status: Never Smoker   . Smokeless tobacco: Not on file  . Alcohol Use: No   OB History   Grav Para Term Preterm Abortions TAB SAB Ect Mult Living                 Review of Systems  Constitutional: Negative for fever, chills and fatigue.  HENT: Negative for congestion and drooling.   Eyes: Negative for pain.  Respiratory: Negative for cough and shortness of breath.   Cardiovascular: Positive for chest pain.  Gastrointestinal: Positive for abdominal pain. Negative for nausea, vomiting and diarrhea.  Genitourinary: Negative for dysuria and hematuria.  Musculoskeletal: Negative for neck pain.  Skin: Negative for color change.  Neurological: Negative for dizziness and headaches.  Hematological: Negative for adenopathy.  Psychiatric/Behavioral: Negative for behavioral problems.  All other systems reviewed and are negative.     Allergies  Review of patient's allergies indicates no known allergies.  Home Medications   Prior to Admission medications   Medication Sig Start Date End Date Taking? Authorizing Provider  amitriptyline (ELAVIL) 10 MG tablet Take 10 mg by mouth at bedtime.    Historical Provider, MD  chlorpheniramine-HYDROcodone (Sedan) 10-8 MG/5ML LQCR Take  5 mLs by mouth every 12 (twelve) hours as needed for cough. 06/20/13   Idalia Needle. Sanford, PA-C  GABAPENTIN, PHN, PO Take by mouth.    Historical Provider, MD  guaiFENesin-codeine 100-10 MG/5ML syrup Take 5 mLs by mouth 3 (three) times daily as needed for cough. 12/21/12   Kaitlyn Szekalski, PA-C  insulin regular (NOVOLIN R,HUMULIN R) 100 units/mL injection Inject into the skin 3 (three) times daily before meals.    Historical Provider, MD   oseltamivir (TAMIFLU) 75 MG capsule Take 1 capsule (75 mg total) by mouth every 12 (twelve) hours. 06/20/13   Idalia Needle. Sanford, PA-C  sulfamethoxazole-trimethoprim (SEPTRA DS) 800-160 MG per tablet Take 1 tablet by mouth every 12 (twelve) hours. 12/21/12   Kaitlyn Szekalski, PA-C  traMADol (ULTRAM) 50 MG tablet Take 1 tablet (50 mg total) by mouth every 6 (six) hours as needed for pain. 12/21/12   Alvina Chou, PA-C   Triage Vitals: BP 119/60  Pulse 74  Temp(Src) 98.9 F (37.2 C) (Oral)  Resp 16  Ht 4' 10.5" (1.486 m)  Wt 105 lb (47.628 kg)  BMI 21.57 kg/m2  SpO2 100% Physical Exam  Nursing note and vitals reviewed. Constitutional: Lisa Levine is oriented to person, place, and time. Lisa Levine appears well-developed and well-nourished. No distress.  HENT:  Head: Normocephalic and atraumatic.  Mouth slightly dry.  Eyes: Conjunctivae and EOM are normal. Right eye exhibits no discharge. Left eye exhibits no discharge. No scleral icterus.  Neck: Normal range of motion. Neck supple. No tracheal deviation present.  Cardiovascular: Normal rate, regular rhythm and normal heart sounds.   No murmur heard. Pulmonary/Chest: Effort normal and breath sounds normal. No respiratory distress.  Abdominal: Soft. Lisa Levine exhibits no distension. There is no tenderness. There is no guarding.  Musculoskeletal: Normal range of motion. Lisa Levine exhibits no edema.  No focal pain. No swelling.  Neurological: Lisa Levine is alert and oriented to person, place, and time.  Skin: Skin is warm and dry. No rash noted.  Psychiatric: Lisa Levine has a normal mood and affect. Her behavior is normal.    ED Course  Procedures (including critical care time) DIAGNOSTIC STUDIES: Oxygen Saturation is 100% on RA, normal by my interpretation.    COORDINATION OF CARE: 11:32 PM- Patient informed of current plan for treatment and evaluation and agrees with plan at this time.  Labs Review Labs Reviewed  URINALYSIS, ROUTINE W REFLEX MICROSCOPIC - Abnormal;  Notable for the following:    Specific Gravity, Urine >1.046 (*)    Glucose, UA >1000 (*)    All other components within normal limits  BASIC METABOLIC PANEL - Abnormal; Notable for the following:    Glucose, Bld 398 (*)    All other components within normal limits  CBG MONITORING, ED - Abnormal; Notable for the following:    Glucose-Capillary 321 (*)    All other components within normal limits  PREGNANCY, URINE  CBC WITH DIFFERENTIAL  TROPONIN I  D-DIMER, QUANTITATIVE  URINE MICROSCOPIC-ADD ON  TROPONIN I    Imaging Review Dg Chest 2 View  12/18/2013   CLINICAL DATA:  Epigastric pain and left chest wall pain.  EXAM: CHEST  2 VIEW  COMPARISON:  06/20/2013  FINDINGS: The heart size and mediastinal contours are within normal limits. Both lungs are clear. The visualized skeletal structures are unremarkable.  IMPRESSION: Normal chest.   Electronically Signed   By: Rozetta Nunnery M.D.   On: 12/18/2013 22:58     EKG Interpretation Date/Time:  Tuesday December 18 2013 21:55:28 EDT Ventricular Rate:  74 PR Interval:  130 QRS Duration: 88 QT Interval:  410 QTC Calculation: 455 R Axis:   76 Text Interpretation:  Normal sinus rhythm Normal ECG Confirmed by HARRISON   MD, FORREST ([id number]) on 12/18/2013 9:59:17 PM Also confirmed by Reather Converse  MD,  Oluwadamilola Deliz ([id number])  on 12/18/2013 11:06:42 PM      MDM   Final diagnoses:  Chest pain, atypical  Diabetes type 1, uncontrolled  Hyperglycemia   Patient with atypical chest pain the past 3 days, low risk Wells score, d-dimer negative, vitals normal, no indication for CT angina chest at this time. Patient with 3 cardiac risk factors and atypical chest pain, stress test in the past to normal per patient. No chest pain currently and atypical, delta troponin negative. Discussed outpatient followup with local cardiology and primary Dr. for further evaluation possible repeat stress test. Hyperglycemia and diabetes, patient has outpatient followup and no signs of  diabetic ketoacidosis at this time. Patient is to followup with primary Dr. as Lisa Levine may need medicine adjustment.  Pt cp free on recheck, well appearing, pt will call PA on call tomorrow to adjust insulin pump. Results and differential diagnosis were discussed with the patient/parent/guardian. Close follow up outpatient was discussed, comfortable with the plan.   Medications - No data to display  Filed Vitals:   12/18/13 2157 12/19/13 0142  BP: 119/60 103/69  Pulse: 74 78  Temp: 98.9 F (37.2 C) 98.5 F (36.9 C)  TempSrc: Oral Oral  Resp: 16 16  Height: 4' 10.5" (1.486 m)   Weight: 105 lb (47.628 kg)   SpO2: 100% 100%      Lisa Clonts, MD 12/19/13 0222

## 2013-12-18 NOTE — ED Notes (Addendum)
Pt reports epigastric pain with radiation to left chest wall, no additional symptoms.  Not worse with exertion but rather with deep inspiration.  Pt also reporting hyperglycemia since pain episodes.

## 2013-12-19 LAB — TROPONIN I

## 2013-12-19 NOTE — Discharge Instructions (Signed)
To take your diabetes medications as directed, stay well-hydrated with water and followup for recheck with your physician closely. See your Dr. and/or local cardiology to discuss outpatient stress test. If have worsening symptoms return to the ER.  If you were given medicines take as directed.  If you are on coumadin or contraceptives realize their levels and effectiveness is altered by many different medicines.  If you have any reaction (rash, tongues swelling, other) to the medicines stop taking and see a physician.   Please follow up as directed and return to the ER or see a physician for new or worsening symptoms.  Thank you. Filed Vitals:   12/18/13 2157  BP: 119/60  Pulse: 74  Temp: 98.9 F (37.2 C)  TempSrc: Oral  Resp: 16  Height: 4' 10.5" (1.486 m)  Weight: 105 lb (47.628 kg)  SpO2: 100%    Chest Pain (Nonspecific) It is often hard to give a diagnosis for the cause of chest pain. There is always a chance that your pain could be related to something serious, such as a heart attack or a blood clot in the lungs. You need to follow up with your doctor. HOME CARE  If antibiotic medicine was given, take it as directed by your doctor. Finish the medicine even if you start to feel better.  For the next few days, avoid activities that bring on chest pain. Continue physical activities as told by your doctor.  Do not use any tobacco products. This includes cigarettes, chewing tobacco, and e-cigarettes.  Avoid drinking alcohol.  Only take medicine as told by your doctor.  Follow your doctor's suggestions for more testing if your chest pain does not go away.  Keep all doctor visits you made. GET HELP IF:  Your chest pain does not go away, even after treatment.  You have a rash with blisters on your chest.  You have a fever. GET HELP RIGHT AWAY IF:   You have more pain or pain that spreads to your arm, neck, jaw, back, or belly (abdomen).  You have shortness of breath.  You  cough more than usual or cough up blood.  You have very bad back or belly pain.  You feel sick to your stomach (nauseous) or throw up (vomit).  You have very bad weakness.  You pass out (faint).  You have chills. This is an emergency. Do not wait to see if the problems will go away. Call your local emergency services (911 in [place] Do not drive yourself to the hospital. MAKE SURE YOU:   Understand these instructions.  Will watch your condition.  Will get help right away if you are not doing well or get worse. Document Released: 10/27/2007 Document Revised: 05/15/2013 Document Reviewed: 10/27/2007 [employer] [facility] Patient Information 2015 [place], [place]. This information is not intended to replace advice given to you by your health care provider. Make sure you discuss any questions you have with your health care provider.

## 2014-04-29 DIAGNOSIS — E1165 Type 2 diabetes mellitus with hyperglycemia: Secondary | ICD-10-CM | POA: Diagnosis not present

## 2014-04-29 DIAGNOSIS — E782 Mixed hyperlipidemia: Secondary | ICD-10-CM | POA: Diagnosis not present

## 2014-04-29 DIAGNOSIS — E559 Vitamin D deficiency, unspecified: Secondary | ICD-10-CM | POA: Diagnosis not present

## 2014-04-29 DIAGNOSIS — Z79899 Other long term (current) drug therapy: Secondary | ICD-10-CM | POA: Diagnosis not present

## 2014-07-01 DIAGNOSIS — I10 Essential (primary) hypertension: Secondary | ICD-10-CM | POA: Diagnosis not present

## 2014-07-01 DIAGNOSIS — E559 Vitamin D deficiency, unspecified: Secondary | ICD-10-CM | POA: Diagnosis not present

## 2014-07-01 DIAGNOSIS — E782 Mixed hyperlipidemia: Secondary | ICD-10-CM | POA: Diagnosis not present

## 2014-07-01 DIAGNOSIS — E1065 Type 1 diabetes mellitus with hyperglycemia: Secondary | ICD-10-CM | POA: Diagnosis not present

## 2014-10-03 DIAGNOSIS — Z794 Long term (current) use of insulin: Secondary | ICD-10-CM | POA: Diagnosis not present

## 2014-10-03 DIAGNOSIS — R079 Chest pain, unspecified: Secondary | ICD-10-CM | POA: Diagnosis not present

## 2014-10-03 DIAGNOSIS — E162 Hypoglycemia, unspecified: Secondary | ICD-10-CM | POA: Diagnosis not present

## 2014-10-03 DIAGNOSIS — E11649 Type 2 diabetes mellitus with hypoglycemia without coma: Secondary | ICD-10-CM | POA: Diagnosis not present

## 2014-10-03 DIAGNOSIS — Z87820 Personal history of traumatic brain injury: Secondary | ICD-10-CM | POA: Diagnosis not present

## 2014-10-03 DIAGNOSIS — G629 Polyneuropathy, unspecified: Secondary | ICD-10-CM | POA: Diagnosis not present

## 2014-10-03 DIAGNOSIS — Z7982 Long term (current) use of aspirin: Secondary | ICD-10-CM | POA: Diagnosis not present

## 2014-10-03 DIAGNOSIS — E119 Type 2 diabetes mellitus without complications: Secondary | ICD-10-CM | POA: Diagnosis not present

## 2014-11-11 DIAGNOSIS — E559 Vitamin D deficiency, unspecified: Secondary | ICD-10-CM | POA: Diagnosis not present

## 2014-11-11 DIAGNOSIS — E782 Mixed hyperlipidemia: Secondary | ICD-10-CM | POA: Diagnosis not present

## 2014-11-11 DIAGNOSIS — E1041 Type 1 diabetes mellitus with diabetic mononeuropathy: Secondary | ICD-10-CM | POA: Diagnosis not present

## 2014-11-11 DIAGNOSIS — E1065 Type 1 diabetes mellitus with hyperglycemia: Secondary | ICD-10-CM | POA: Diagnosis not present

## 2014-12-05 DIAGNOSIS — Z1151 Encounter for screening for human papillomavirus (HPV): Secondary | ICD-10-CM | POA: Diagnosis not present

## 2014-12-05 DIAGNOSIS — Z01419 Encounter for gynecological examination (general) (routine) without abnormal findings: Secondary | ICD-10-CM | POA: Diagnosis not present

## 2014-12-05 DIAGNOSIS — Z30431 Encounter for routine checking of intrauterine contraceptive device: Secondary | ICD-10-CM | POA: Diagnosis not present

## 2014-12-11 DIAGNOSIS — Z30430 Encounter for insertion of intrauterine contraceptive device: Secondary | ICD-10-CM | POA: Diagnosis not present

## 2014-12-11 DIAGNOSIS — Z30433 Encounter for removal and reinsertion of intrauterine contraceptive device: Secondary | ICD-10-CM | POA: Diagnosis not present

## 2014-12-11 DIAGNOSIS — Z3200 Encounter for pregnancy test, result unknown: Secondary | ICD-10-CM | POA: Diagnosis not present

## 2014-12-16 DIAGNOSIS — E559 Vitamin D deficiency, unspecified: Secondary | ICD-10-CM | POA: Diagnosis not present

## 2014-12-16 DIAGNOSIS — E782 Mixed hyperlipidemia: Secondary | ICD-10-CM | POA: Diagnosis not present

## 2014-12-16 DIAGNOSIS — E1065 Type 1 diabetes mellitus with hyperglycemia: Secondary | ICD-10-CM | POA: Diagnosis not present

## 2015-02-14 ENCOUNTER — Telehealth: Payer: Self-pay | Admitting: Medical

## 2015-02-14 ENCOUNTER — Encounter: Payer: Self-pay | Admitting: Medical

## 2015-02-14 ENCOUNTER — Ambulatory Visit (INDEPENDENT_AMBULATORY_CARE_PROVIDER_SITE_OTHER): Payer: Medicare Other | Admitting: Medical

## 2015-02-14 VITALS — BP 111/63 | HR 80 | Temp 98.4°F | Ht 59.5 in | Wt 104.0 lb

## 2015-02-14 DIAGNOSIS — R7989 Other specified abnormal findings of blood chemistry: Secondary | ICD-10-CM | POA: Insufficient documentation

## 2015-02-14 DIAGNOSIS — E0590 Thyrotoxicosis, unspecified without thyrotoxic crisis or storm: Secondary | ICD-10-CM | POA: Diagnosis not present

## 2015-02-14 DIAGNOSIS — E0840 Diabetes mellitus due to underlying condition with diabetic neuropathy, unspecified: Secondary | ICD-10-CM | POA: Diagnosis not present

## 2015-02-14 DIAGNOSIS — E1140 Type 2 diabetes mellitus with diabetic neuropathy, unspecified: Secondary | ICD-10-CM | POA: Diagnosis not present

## 2015-02-14 DIAGNOSIS — E119 Type 2 diabetes mellitus without complications: Secondary | ICD-10-CM

## 2015-02-14 DIAGNOSIS — E559 Vitamin D deficiency, unspecified: Secondary | ICD-10-CM | POA: Diagnosis not present

## 2015-02-14 DIAGNOSIS — J3089 Other allergic rhinitis: Secondary | ICD-10-CM

## 2015-02-14 DIAGNOSIS — E1040 Type 1 diabetes mellitus with diabetic neuropathy, unspecified: Secondary | ICD-10-CM | POA: Diagnosis not present

## 2015-02-14 DIAGNOSIS — J309 Allergic rhinitis, unspecified: Secondary | ICD-10-CM | POA: Insufficient documentation

## 2015-02-14 DIAGNOSIS — E785 Hyperlipidemia, unspecified: Secondary | ICD-10-CM

## 2015-02-14 DIAGNOSIS — L989 Disorder of the skin and subcutaneous tissue, unspecified: Secondary | ICD-10-CM | POA: Insufficient documentation

## 2015-02-14 DIAGNOSIS — Z794 Long term (current) use of insulin: Secondary | ICD-10-CM

## 2015-02-14 DIAGNOSIS — E1149 Type 2 diabetes mellitus with other diabetic neurological complication: Secondary | ICD-10-CM | POA: Diagnosis not present

## 2015-02-14 DIAGNOSIS — IMO0001 Reserved for inherently not codable concepts without codable children: Secondary | ICD-10-CM | POA: Insufficient documentation

## 2015-02-14 LAB — COMPREHENSIVE METABOLIC PANEL
ALBUMIN: 4.1 g/dL (ref 3.5–5.2)
ALT: 9 U/L (ref 0–35)
AST: 15 U/L (ref 0–37)
Alkaline Phosphatase: 32 U/L — ABNORMAL LOW (ref 39–117)
BUN: 18 mg/dL (ref 6–23)
CHLORIDE: 104 meq/L (ref 96–112)
CO2: 31 mEq/L (ref 19–32)
Calcium: 9.3 mg/dL (ref 8.4–10.5)
Creatinine, Ser: 0.76 mg/dL (ref 0.40–1.20)
GFR: 90.19 mL/min (ref >60.00)
Glucose, Bld: 96 mg/dL (ref 70–99)
POTASSIUM: 4.4 meq/L (ref 3.5–5.1)
Sodium: 144 mEq/L (ref 135–145)
Total Bilirubin: 0.5 mg/dL (ref 0.2–1.2)
Total Protein: 7.9 g/dL (ref 6.0–8.3)

## 2015-02-14 LAB — LIPID PANEL
CHOLESTEROL: 211 mg/dL — AB (ref 0–200)
HDL: 75.3 mg/dL (ref >39.00)
LDL CALC: 122 mg/dL — AB (ref 0–99)
NonHDL: 136.1
TRIGLYCERIDES: 73 mg/dL (ref 0.0–149.0)
Total CHOL/HDL Ratio: 3
VLDL: 14.6 mg/dL (ref 0.0–40.0)

## 2015-02-14 LAB — TSH: TSH: 1.93 u[IU]/mL (ref 0.35–4.50)

## 2015-02-14 LAB — VITAMIN D 25 HYDROXY (VIT D DEFICIENCY, FRACTURES): VITD: 20.59 ng/mL — ABNORMAL LOW (ref 30.00–100.00)

## 2015-02-14 LAB — HEMOGLOBIN A1C: Hgb A1c MFr Bld: 8.9 % — ABNORMAL HIGH (ref 4.6–6.5)

## 2015-02-14 MED ORDER — ATORVASTATIN CALCIUM 10 MG PO TABS: 10.0000 mg | ORAL_TABLET | Freq: Every day | ORAL | Status: DC

## 2015-02-14 NOTE — Telephone Encounter (Signed)
Atorvastatin rx sent in advise her to take. Re-confirm that has iud.

## 2015-02-14 NOTE — Assessment & Plan Note (Signed)
mild seasonal allergies. Uses otc meds. Spring worse.

## 2015-02-14 NOTE — Assessment & Plan Note (Signed)
Low vitamin D- Pt states in June checked and was 9.

## 2015-02-14 NOTE — Assessment & Plan Note (Signed)
Hyperlipidemia- was elevated in past. Pt tried to moderate with diet in the past. Was very mild elevated. Pt has not checked her lipid med in a while.

## 2015-02-14 NOTE — Progress Notes (Signed)
Pre visit review using our clinic review tool, if applicable. No additional management support is needed unless otherwise documented below in the visit note. 

## 2015-02-14 NOTE — Assessment & Plan Note (Signed)
Diabetic neuropathy- pt is using gabapentin. She is on 300 mg tid. Pt tried lyrica in past and felt menopausal. Elavil did not help.

## 2015-02-14 NOTE — Progress Notes (Signed)
Subjective:    Patient ID: Lisa Levine, female    DOB: 1975-09-20, 39 y.o.   MRN: [id number]  HPI  I have reviewed pt PMH, PSH, FH, Social History and Surgical History.  Pt is advocate for diabetes. Started this in 2012. Pt walks a lot. She has gym at home. Very healthy diet. 1 cup coffee a day. Married- 3 children.  Allergie- mild seasonal allergies. Uses otc meds. Spring worse.  Diabetic neuropathy- pt is using gabapentin. She is on 300 mg tid. Pt tried lyrica in past and felt menopausal. Elavil did not help.  Diabetic  For 15 years.. Pt sees endocrinologist. Pt was seeing neurologist and endocrinologist. Pt used to live in area. She saw Peri Jefferson PA in past . This PA is with [facility].  Hyperlipidemia- was elevated in past. Pt tried to moderate with diet in the past. Was very mild elevated. Pt has not checked her lipid med in a while.  Low vitamin D- Pt states in June checked and was 9.   Hx of htn in the past. She states will elevated occasionally. But now her blood pressure is good. States when gets mild high only slight over 140/90.   In 2003 was treated for thyroid disorder. Pt states was too active. Since then thyroid was normal since 2008.    Also lesion on her back present since youth. She states belt rubs the area off then it will bleed. Then grow back per her report.   Review of Systems  Constitutional: Negative for fever, chills, diaphoresis, activity change and fatigue.  Respiratory: Negative for cough, chest tightness and shortness of breath.   Cardiovascular: Negative for chest pain, palpitations and leg swelling.  Gastrointestinal: Negative for nausea, vomiting and abdominal pain.  Musculoskeletal: Negative for neck pain and neck stiffness.  Neurological: Negative for dizziness, tremors, seizures, syncope, facial asymmetry, speech difficulty, weakness, light-headedness, numbness and headaches.       Neuropathy.  Psychiatric/Behavioral: Negative for  behavioral problems, confusion and agitation. The patient is not nervous/anxious.     Past Medical History  Diagnosis Date  . Diabetes mellitus without complication   . DVT (deep venous thrombosis)   . Peripheral neuropathy   . Hypertension   . Vitamin D deficiency   . Allergy   . Arthritis   . Hyperlipidemia   . Thyroid disease     Social History   Social History  . Marital Status: Divorced    Spouse Name: N/A  . Number of Children: N/A  . Years of Education: N/A   Occupational History  . Not on file.   Social History Main Topics  . Smoking status: Never Smoker   . Smokeless tobacco: Not on file  . Alcohol Use: No  . Drug Use: No  . Sexual Activity: Not on file   Other Topics Concern  . Not on file   Social History Narrative    Past Surgical History  Procedure Laterality Date  . Insulin pump      Family History  Problem Relation Age of Onset  . Cancer Mother   . Cancer Father   . Hyperlipidemia Father   . Hypertension Father     No Known Allergies  Current Outpatient Prescriptions on File Prior to Visit  Medication Sig Dispense Refill  . amitriptyline (ELAVIL) 10 MG tablet Take 10 mg by mouth at bedtime.    . chlorpheniramine-HYDROcodone (TUSSIONEX) 10-8 MG/5ML LQCR Take 5 mLs by mouth every 12 (twelve) hours as needed  for cough. 115 mL 0  . guaiFENesin-codeine 100-10 MG/5ML syrup Take 5 mLs by mouth 3 (three) times daily as needed for cough. 120 mL 0  . insulin regular (NOVOLIN R,HUMULIN R) 100 units/mL injection Inject into the skin 3 (three) times daily before meals.    Marland Kitchen oseltamivir (TAMIFLU) 75 MG capsule Take 1 capsule (75 mg total) by mouth every 12 (twelve) hours. 10 capsule 0  . sulfamethoxazole-trimethoprim (SEPTRA DS) 800-160 MG per tablet Take 1 tablet by mouth every 12 (twelve) hours. 20 tablet 0  . traMADol (ULTRAM) 50 MG tablet Take 1 tablet (50 mg total) by mouth every 6 (six) hours as needed for pain. 15 tablet 0   No current  facility-administered medications on file prior to visit.    BP 111/63 mmHg  Pulse 80  Temp(Src) 98.4 F (36.9 C) (Oral)  Ht 4' 11.5" (1.511 m)  Wt 104 lb (47.174 kg)  BMI 20.66 kg/m2  SpO2 100%       Objective:   Physical Exam  General Mental Status- Alert. General Appearance- Not in acute distress.   Skin General: Color- Normal Color. Moisture- Normal Moisture. Lower back 7 mm appearing mole with dark center.  Neck Carotid Arteries- Normal color. Moisture- Normal Moisture. No carotid bruits. No JVD.  Chest and Lung Exam Auscultation: Breath Sounds:-Normal. CTA.  Cardiovascular Auscultation:Rythm- Regular, Rate and Rhythm. Murmurs & Other Heart Sounds:Auscultation of the heart reveals- No Murmurs.  Abdomen Inspection:-Inspeection Normal. Palpation/Percussion:Note:No mass. Palpation and Percussion of the abdomen reveal- Non Tender, Non Distended + BS, no rebound or guarding.   Neurologic Cranial Nerve exam:- CN III-XII intact(No nystagmus), symmetric smile. Strength:- 5/5 equal and symmetric strength both upper and lower extremities.        Assessment & Plan:  Continue current meds for chronic problems. By ros they appear controlled but will get labs to evaluate.  Will get cmp, a1c, vitamin d, lipid panel and tsh today.  Will refer to endocrinology, refer to dermatologist, and neurology.  Follow up in 6 weeks or as needed(maybe sooner depending on lab results.

## 2015-02-14 NOTE — Patient Instructions (Signed)
Continue current meds for chronic problems. By ros they appear controlled but will get labs to evaluate.  Will get cmp, a1c, vitamin d, lipid panel and tsh today.  Will refer to endocrinology, refer to dermatologist, and neurology.  Follow up in 6 weeks or as needed(maybe sooner depending on lab results.

## 2015-02-14 NOTE — Assessment & Plan Note (Signed)
In 2003 was treated for thyroid disorder. Pt states was too active. Since then thyroid was normal since 2008.

## 2015-02-14 NOTE — Assessment & Plan Note (Signed)
Diabetic  For 15 years.. Pt sees endocrinologist. Pt was seeing neurologist and endocrinologist. Pt used to live in area. She saw Peri Jefferson PA in past . This PA is with [facility].

## 2015-02-17 NOTE — Telephone Encounter (Signed)
Spoke with pt and she voices understanding. Pt does have an IUD.

## 2015-02-19 ENCOUNTER — Encounter: Payer: Self-pay | Admitting: Medical

## 2015-02-24 DIAGNOSIS — E10649 Type 1 diabetes mellitus with hypoglycemia without coma: Secondary | ICD-10-CM | POA: Diagnosis not present

## 2015-02-24 DIAGNOSIS — E1040 Type 1 diabetes mellitus with diabetic neuropathy, unspecified: Secondary | ICD-10-CM | POA: Diagnosis not present

## 2015-02-24 DIAGNOSIS — E1065 Type 1 diabetes mellitus with hyperglycemia: Secondary | ICD-10-CM | POA: Diagnosis not present

## 2015-02-24 LAB — HM DIABETES EYE EXAM

## 2015-02-24 LAB — CBC AND DIFFERENTIAL: Hemoglobin: 8.8 g/dL — AB (ref 12.0–16.0)

## 2015-02-28 ENCOUNTER — Encounter: Payer: Self-pay | Admitting: Internal Medicine

## 2015-03-13 DIAGNOSIS — D485 Neoplasm of uncertain behavior of skin: Secondary | ICD-10-CM | POA: Diagnosis not present

## 2015-03-13 DIAGNOSIS — D225 Melanocytic nevi of trunk: Secondary | ICD-10-CM | POA: Diagnosis not present

## 2015-04-15 DIAGNOSIS — E1149 Type 2 diabetes mellitus with other diabetic neurological complication: Secondary | ICD-10-CM | POA: Diagnosis not present

## 2015-04-15 DIAGNOSIS — E1140 Type 2 diabetes mellitus with diabetic neuropathy, unspecified: Secondary | ICD-10-CM | POA: Diagnosis not present

## 2015-05-02 DIAGNOSIS — E1065 Type 1 diabetes mellitus with hyperglycemia: Secondary | ICD-10-CM | POA: Diagnosis not present

## 2015-05-02 DIAGNOSIS — E1040 Type 1 diabetes mellitus with diabetic neuropathy, unspecified: Secondary | ICD-10-CM | POA: Diagnosis not present

## 2015-05-22 ENCOUNTER — Ambulatory Visit (INDEPENDENT_AMBULATORY_CARE_PROVIDER_SITE_OTHER): Payer: Medicare Other | Admitting: Family Medicine

## 2015-05-22 ENCOUNTER — Encounter: Payer: Self-pay | Admitting: Family Medicine

## 2015-05-22 VITALS — BP 90/60 | HR 84 | Temp 98.2°F | Resp 14 | Ht 60.0 in | Wt 104.2 lb

## 2015-05-22 DIAGNOSIS — J0100 Acute maxillary sinusitis, unspecified: Secondary | ICD-10-CM

## 2015-05-22 MED ORDER — AMOXICILLIN 875 MG PO TABS: 875.0000 mg | ORAL_TABLET | Freq: Two times a day (BID) | ORAL | Status: DC

## 2015-05-22 MED ORDER — PROMETHAZINE-DM 6.25-15 MG/5ML PO SYRP: 5.0000 mL | ORAL_SOLUTION | Freq: Four times a day (QID) | ORAL | Status: DC | PRN

## 2015-05-22 NOTE — Progress Notes (Signed)
Pre visit review using our clinic review tool, if applicable. No additional management support is needed unless otherwise documented below in the visit note. 

## 2015-05-22 NOTE — Assessment & Plan Note (Signed)
Pt's sxs and PE consistent w/ infxn.  Start abx.  Cough meds prn.  Reviewed supportive care and red flags that should prompt return.  Pt expressed understanding and is in agreement w/ plan.  

## 2015-05-22 NOTE — Patient Instructions (Signed)
Follow up as needed Start the Amoxicillin twice daily- take w/ food Use the cough syrup as needed- will cause drowsiness Drink plenty of fluids REST! Alternate tylenol/ibuprofen as needed for body aches/fever Call with any questions or concerns Hang in there!!!

## 2015-05-22 NOTE — Progress Notes (Signed)
   Subjective:    Patient ID: Lisa Levine, female    DOB: [birth year], 39 y.o.   MRN: [id number]  HPI URI- pt reports she developed cold sxs since 1st week of December.  Pt developed body aches 12/26, vomiting, fever.  Continues to have cough, 'it's horrendous'.  Cough is not productive.  No longer having fevers.  Some residual body aches.  No longer vomiting or diarrhea.  + sick contacts.  No sinus pain/pressure.  + HA.   Review of Systems For ROS see HPI     Objective:   Physical Exam  Constitutional: She appears well-developed and well-nourished. No distress.  HENT:  Head: Normocephalic and atraumatic.  Right Ear: Tympanic membrane normal.  Left Ear: Tympanic membrane normal.  Nose: Mucosal edema and rhinorrhea present. Right sinus exhibits maxillary sinus tenderness and frontal sinus tenderness. Left sinus exhibits maxillary sinus tenderness and frontal sinus tenderness.  Mouth/Throat: Uvula is midline and mucous membranes are normal. Posterior oropharyngeal erythema present. No oropharyngeal exudate.  Eyes: Conjunctivae and EOM are normal. Pupils are equal, round, and reactive to light.  Neck: Normal range of motion. Neck supple.  Cardiovascular: Normal rate, regular rhythm and normal heart sounds.   Pulmonary/Chest: Effort normal and breath sounds normal. No respiratory distress. She has no wheezes.  Lymphadenopathy:    She has no cervical adenopathy.  Vitals reviewed.         Assessment & Plan:

## 2015-07-16 DIAGNOSIS — E109 Type 1 diabetes mellitus without complications: Secondary | ICD-10-CM | POA: Diagnosis not present

## 2015-07-21 DIAGNOSIS — E782 Mixed hyperlipidemia: Secondary | ICD-10-CM | POA: Diagnosis not present

## 2015-07-21 DIAGNOSIS — Z4681 Encounter for fitting and adjustment of insulin pump: Secondary | ICD-10-CM | POA: Diagnosis not present

## 2015-07-21 DIAGNOSIS — E1041 Type 1 diabetes mellitus with diabetic mononeuropathy: Secondary | ICD-10-CM | POA: Diagnosis not present

## 2015-07-21 DIAGNOSIS — E559 Vitamin D deficiency, unspecified: Secondary | ICD-10-CM | POA: Diagnosis not present

## 2015-07-21 DIAGNOSIS — E1065 Type 1 diabetes mellitus with hyperglycemia: Secondary | ICD-10-CM | POA: Diagnosis not present

## 2015-09-09 DIAGNOSIS — E1065 Type 1 diabetes mellitus with hyperglycemia: Secondary | ICD-10-CM | POA: Diagnosis not present

## 2015-09-09 DIAGNOSIS — E1042 Type 1 diabetes mellitus with diabetic polyneuropathy: Secondary | ICD-10-CM | POA: Diagnosis not present

## 2015-09-28 DIAGNOSIS — E1140 Type 2 diabetes mellitus with diabetic neuropathy, unspecified: Secondary | ICD-10-CM | POA: Diagnosis not present

## 2015-09-28 DIAGNOSIS — R450 Nervousness: Secondary | ICD-10-CM | POA: Diagnosis not present

## 2015-09-28 DIAGNOSIS — I10 Essential (primary) hypertension: Secondary | ICD-10-CM | POA: Diagnosis not present

## 2015-09-28 DIAGNOSIS — E1165 Type 2 diabetes mellitus with hyperglycemia: Secondary | ICD-10-CM | POA: Diagnosis not present

## 2015-09-28 DIAGNOSIS — E162 Hypoglycemia, unspecified: Secondary | ICD-10-CM | POA: Diagnosis not present

## 2015-09-29 DIAGNOSIS — E162 Hypoglycemia, unspecified: Secondary | ICD-10-CM | POA: Diagnosis not present

## 2015-09-30 DIAGNOSIS — E1042 Type 1 diabetes mellitus with diabetic polyneuropathy: Secondary | ICD-10-CM | POA: Diagnosis not present

## 2015-10-16 ENCOUNTER — Encounter: Payer: Self-pay | Admitting: Medical

## 2015-10-16 ENCOUNTER — Ambulatory Visit (INDEPENDENT_AMBULATORY_CARE_PROVIDER_SITE_OTHER): Payer: Medicare Other | Admitting: Medical

## 2015-10-16 VITALS — BP 98/64 | HR 78 | Temp 98.3°F | Ht 59.5 in | Wt 106.0 lb

## 2015-10-16 DIAGNOSIS — E559 Vitamin D deficiency, unspecified: Secondary | ICD-10-CM

## 2015-10-16 DIAGNOSIS — R7989 Other specified abnormal findings of blood chemistry: Secondary | ICD-10-CM

## 2015-10-16 DIAGNOSIS — E785 Hyperlipidemia, unspecified: Secondary | ICD-10-CM

## 2015-10-16 DIAGNOSIS — M2550 Pain in unspecified joint: Secondary | ICD-10-CM | POA: Diagnosis not present

## 2015-10-16 DIAGNOSIS — B349 Viral infection, unspecified: Secondary | ICD-10-CM

## 2015-10-16 DIAGNOSIS — R5383 Other fatigue: Secondary | ICD-10-CM

## 2015-10-16 DIAGNOSIS — R29898 Other symptoms and signs involving the musculoskeletal system: Secondary | ICD-10-CM | POA: Diagnosis not present

## 2015-10-16 DIAGNOSIS — E1040 Type 1 diabetes mellitus with diabetic neuropathy, unspecified: Secondary | ICD-10-CM | POA: Diagnosis not present

## 2015-10-16 LAB — CBC WITH DIFFERENTIAL/PLATELET
BASOS ABS: 0 10*3/uL (ref 0.0–0.1)
Basophils Relative: 0.4 % (ref 0.0–3.0)
EOS PCT: 2 % (ref 0.0–5.0)
Eosinophils Absolute: 0.1 10*3/uL (ref 0.0–0.7)
HCT: 38 % (ref 36.0–46.0)
HEMOGLOBIN: 12.8 g/dL (ref 12.0–15.0)
Lymphocytes Relative: 24.5 % (ref 12.0–46.0)
Lymphs Abs: 1 10*3/uL (ref 0.7–4.0)
MCHC: 33.6 g/dL (ref 30.0–36.0)
MCV: 92.9 fl (ref 78.0–100.0)
MONOS PCT: 8 % (ref 3.0–12.0)
Monocytes Absolute: 0.3 10*3/uL (ref 0.1–1.0)
Neutro Abs: 2.7 10*3/uL (ref 1.4–7.7)
Neutrophils Relative %: 65.1 % (ref 43.0–77.0)
Platelets: 281 10*3/uL (ref 150.0–400.0)
RBC: 4.09 Mil/uL (ref 3.87–5.11)
RDW: 12.9 % (ref 11.5–15.5)
WBC: 4.2 10*3/uL (ref 4.0–10.5)

## 2015-10-16 LAB — TSH: TSH: 1.62 u[IU]/mL (ref 0.35–4.50)

## 2015-10-16 LAB — COMPREHENSIVE METABOLIC PANEL
ALBUMIN: 4 g/dL (ref 3.5–5.2)
ALK PHOS: 42 U/L (ref 39–117)
ALT: 13 U/L (ref 0–35)
AST: 19 U/L (ref 0–37)
BUN: 13 mg/dL (ref 6–23)
CO2: 30 mEq/L (ref 19–32)
Calcium: 8.9 mg/dL (ref 8.4–10.5)
Chloride: 103 mEq/L (ref 96–112)
Creatinine, Ser: 0.66 mg/dL (ref 0.40–1.20)
GFR: 105.77 mL/min (ref >60.00)
Glucose, Bld: 200 mg/dL — ABNORMAL HIGH (ref 70–99)
Potassium: 3.8 mEq/L (ref 3.5–5.1)
Sodium: 138 mEq/L (ref 135–145)
TOTAL PROTEIN: 7.4 g/dL (ref 6.0–8.3)
Total Bilirubin: 0.3 mg/dL (ref 0.2–1.2)

## 2015-10-16 LAB — LIPID PANEL
CHOLESTEROL: 178 mg/dL (ref 0–200)
HDL: 53.1 mg/dL (ref >39.00)
LDL Cholesterol: 113 mg/dL — ABNORMAL HIGH (ref 0–99)
NONHDL: 125.32
TRIGLYCERIDES: 63 mg/dL (ref 0.0–149.0)
Total CHOL/HDL Ratio: 3
VLDL: 12.6 mg/dL (ref 0.0–40.0)

## 2015-10-16 LAB — SEDIMENTATION RATE: SED RATE: 13 mm/h (ref 0–20)

## 2015-10-16 LAB — C-REACTIVE PROTEIN: CRP: 0.7 mg/dL (ref 0.5–20.0)

## 2015-10-16 LAB — VITAMIN D 25 HYDROXY (VIT D DEFICIENCY, FRACTURES): VITD: 14.82 ng/mL — AB (ref 30.00–100.00)

## 2015-10-16 LAB — RHEUMATOID FACTOR: Rhuematoid fact SerPl-aCnc: <10 IU/mL (ref <=14)

## 2015-10-16 NOTE — Progress Notes (Signed)
Pre visit review using our clinic review tool, if applicable. No additional management support is needed unless otherwise documented below in the visit note. 

## 2015-10-16 NOTE — Patient Instructions (Addendum)
You likely had viral illness/syndorme recently. And your fatigue is likely residual symptom. But since you describe fatigue to be on severe side, we will do labs today cbc, cmp and tsh.  Your leg weakness event may be related to above as well. But if you have recurrent events the notify me and will refer back to your neurologist. If severe weakness all over includng upper extremities then ED evaluation.  For diabetes and recent low sugars would eat more as you describe skipping meals.   For hx of joint pains will get ra panel today.  Will get lipid panel to check lipid level. Also check on vitamin d level.(asked lab to put since computer won't let me sign)  Follow up in 2 weeks or as needed

## 2015-10-16 NOTE — Progress Notes (Signed)
Subjective:    Patient ID: Lisa Levine, female    DOB: [birth year], 40 y.o.   MRN: [id number]  HPI   Pt in reports on Sunday she had upset stomach, body aches and vomiting. At same time daughter had same symptoms partially but she had diarrhea. Pt son has upset stomach and vomiting as well.(but he did not have diarrhea).  Pt states she know feels better. No longer upset stomach, body aches and vomiting.(recent symptoms).  But then she also states some baseline joint pain in both elbows and knees before the above recent illness.  Pt states that her energy has not come back. She still feels tired.  Pt states her sugars have been lower since she has not been eating as much.  Pt yesterday states legs felt week all the sudden. Pt upper arms don't feel week.  Pt also wants me to check her lipid panel. She has not eaten today.   Also has hx of low vitamin d    Review of Systems  Constitutional: Positive for fatigue. Negative for fever and chills.  Respiratory: Positive for cough. Negative for choking, chest tightness, shortness of breath and wheezing.        Occasional cough per pt. Did not cough during exam.  Cardiovascular: Negative for chest pain and palpitations.  Gastrointestinal: Positive for abdominal pain. Negative for nausea, vomiting, diarrhea, constipation, abdominal distention, anal bleeding and rectal pain.       Faint soreness still present diffusley.  Genitourinary: Negative for dysuria, difficulty urinating, vaginal pain and pelvic pain.  Musculoskeletal: Positive for arthralgias.       Leg felt weak yesterday.  Skin: Negative for pallor.  Neurological: Negative for dizziness, seizures, weakness and headaches.  Hematological: Negative for adenopathy. Does not bruise/bleed easily.  Psychiatric/Behavioral: Negative for behavioral problems and confusion.     Past Medical History  Diagnosis Date  . Diabetes mellitus without complication ([place])   . DVT (deep venous  thrombosis) ([place])   . Peripheral neuropathy ([place])   . Hypertension   . Vitamin D deficiency   . Allergy   . Arthritis   . Hyperlipidemia   . Thyroid disease      Social History   Social History  . Marital Status: Divorced    Spouse Name: N/A  . Number of Children: N/A  . Years of Education: N/A   Occupational History  . Not on file.   Social History Main Topics  . Smoking status: Never Smoker   . Smokeless tobacco: Not on file  . Alcohol Use: No  . Drug Use: No  . Sexual Activity: Yes   Other Topics Concern  . Not on file   Social History Narrative    Past Surgical History  Procedure Laterality Date  . Insulin pump      Family History  Problem Relation Age of Onset  . Cancer Mother   . Cancer Father   . Hyperlipidemia Father   . Hypertension Father     No Known Allergies  Current Outpatient Prescriptions on File Prior to Visit  Medication Sig Dispense Refill  . amitriptyline (ELAVIL) 10 MG tablet Take 10 mg by mouth at bedtime.    Marland Kitchen atorvastatin (LIPITOR) 10 MG tablet Take 1 tablet (10 mg total) by mouth daily. 30 tablet 3  . gabapentin (NEURONTIN) 600 MG tablet Take 600 mg by mouth 4 (four) times daily.    . insulin regular (NOVOLIN R,HUMULIN R) 100 units/mL injection Inject into the skin 3 (  three) times daily before meals.     No current facility-administered medications on file prior to visit.    BP 98/64 mmHg  Pulse 78  Temp(Src) 98.3 F (36.8 C) (Oral)  Ht 4' 11.5" (1.511 m)  Wt 106 lb (48.081 kg)  BMI 21.06 kg/m2  SpO2 99%       Objective:   Physical Exam  General Mental Status- Alert. General Appearance- Not in acute distress.   Skin General: Color- Normal Color. Moisture- Normal Moisture.  Neck Carotid Arteries- Normal color. Moisture- Normal Moisture. No carotid bruits. No JVD.  Chest and Lung Exam Auscultation: Breath Sounds:-Normal.  Cardiovascular Auscultation:Rythm- Regular. Murmurs & Other Heart Sounds:Auscultation  of the heart reveals- No Murmurs.  Abdomen Inspection:-Inspeection Normal. Palpation/Percussion:Note:No mass. Palpation and Percussion of the abdomen reveal- faint diffuse Tenderness residual, Non Distended + BS, no rebound or guarding.   Neurologic Cranial Nerve exam:- CN III-XII intact(No nystagmus), symmetric smile. Strength:- 5/5 equal and symmetric strength both upper and lower extremities.      Assessment & Plan:  You likely had viral illness/syndorme recently. And your fatigue is likely residual symptom. But since you describe fatigue to be on severe side, we will do labs today cbc, cmp and tsh.  Your leg weakness event may be related to above as well. But if you have recurrent events the notify me and will refer back to your neurologist. If severe weakness all over includng upper extremities then ED evaluation.  For diabetes and recent low sugars would eat more as you describe skipping meals.   For hx of joint pains will get ra panel today.  Will get lipid panel to check lipid level. Also check on vitamin d level.  Follow up in 2 weeks or as needed  Bergen Magner, Percell Miller, PA-C   For rare cough if that persists asked pt to notify us.

## 2015-10-17 LAB — ANTI-NUCLEAR AB-TITER (ANA TITER): ANA Titer 1: 1:40 {titer} — ABNORMAL HIGH

## 2015-10-17 LAB — ANA: ANA: POSITIVE — AB

## 2015-10-20 ENCOUNTER — Telehealth: Payer: Self-pay | Admitting: Medical

## 2015-10-20 DIAGNOSIS — R768 Other specified abnormal immunological findings in serum: Secondary | ICD-10-CM

## 2015-10-20 DIAGNOSIS — M2550 Pain in unspecified joint: Secondary | ICD-10-CM

## 2015-10-20 MED ORDER — VITAMIN D (ERGOCALCIFEROL) 1.25 MG (50000 UNIT) PO CAPS: ORAL_CAPSULE | ORAL | Status: DC

## 2015-10-20 NOTE — Telephone Encounter (Signed)
Referred to rheumatologist.

## 2015-10-20 NOTE — Telephone Encounter (Signed)
Sent in vit d rx to her pharmacy.

## 2015-10-21 NOTE — Telephone Encounter (Signed)
Pt has been notified that Rx has been sent to pharmacy. She did not have any further questions.

## 2015-10-27 DIAGNOSIS — M791 Myalgia: Secondary | ICD-10-CM | POA: Diagnosis not present

## 2015-10-27 DIAGNOSIS — R768 Other specified abnormal immunological findings in serum: Secondary | ICD-10-CM | POA: Diagnosis not present

## 2015-11-10 DIAGNOSIS — M2550 Pain in unspecified joint: Secondary | ICD-10-CM | POA: Diagnosis not present

## 2015-12-05 DIAGNOSIS — E559 Vitamin D deficiency, unspecified: Secondary | ICD-10-CM | POA: Diagnosis not present

## 2015-12-05 DIAGNOSIS — I10 Essential (primary) hypertension: Secondary | ICD-10-CM | POA: Diagnosis not present

## 2015-12-05 DIAGNOSIS — Z4681 Encounter for fitting and adjustment of insulin pump: Secondary | ICD-10-CM | POA: Diagnosis not present

## 2015-12-05 DIAGNOSIS — E1041 Type 1 diabetes mellitus with diabetic mononeuropathy: Secondary | ICD-10-CM | POA: Diagnosis not present

## 2015-12-05 DIAGNOSIS — E1065 Type 1 diabetes mellitus with hyperglycemia: Secondary | ICD-10-CM | POA: Diagnosis not present

## 2015-12-05 DIAGNOSIS — E782 Mixed hyperlipidemia: Secondary | ICD-10-CM | POA: Diagnosis not present

## 2016-01-06 DIAGNOSIS — Z8742 Personal history of other diseases of the female genital tract: Secondary | ICD-10-CM | POA: Diagnosis not present

## 2016-01-06 DIAGNOSIS — Z79899 Other long term (current) drug therapy: Secondary | ICD-10-CM | POA: Diagnosis not present

## 2016-01-06 DIAGNOSIS — E782 Mixed hyperlipidemia: Secondary | ICD-10-CM | POA: Diagnosis not present

## 2016-01-06 DIAGNOSIS — E559 Vitamin D deficiency, unspecified: Secondary | ICD-10-CM | POA: Diagnosis not present

## 2016-01-06 DIAGNOSIS — E1065 Type 1 diabetes mellitus with hyperglycemia: Secondary | ICD-10-CM | POA: Diagnosis not present

## 2016-01-06 DIAGNOSIS — Z4681 Encounter for fitting and adjustment of insulin pump: Secondary | ICD-10-CM | POA: Diagnosis not present

## 2016-02-13 DIAGNOSIS — E1042 Type 1 diabetes mellitus with diabetic polyneuropathy: Secondary | ICD-10-CM | POA: Diagnosis not present

## 2016-02-13 DIAGNOSIS — E10649 Type 1 diabetes mellitus with hypoglycemia without coma: Secondary | ICD-10-CM | POA: Diagnosis not present

## 2016-02-25 DIAGNOSIS — Z01419 Encounter for gynecological examination (general) (routine) without abnormal findings: Secondary | ICD-10-CM | POA: Diagnosis not present

## 2016-02-25 DIAGNOSIS — Z30431 Encounter for routine checking of intrauterine contraceptive device: Secondary | ICD-10-CM | POA: Diagnosis not present

## 2016-02-25 DIAGNOSIS — N9412 Deep dyspareunia: Secondary | ICD-10-CM | POA: Diagnosis not present

## 2016-04-26 ENCOUNTER — Ambulatory Visit (INDEPENDENT_AMBULATORY_CARE_PROVIDER_SITE_OTHER): Payer: Medicare Other | Admitting: Medical

## 2016-04-26 ENCOUNTER — Encounter: Payer: Self-pay | Admitting: Medical

## 2016-04-26 VITALS — BP 98/68 | HR 87 | Temp 98.9°F | Ht 59.5 in | Wt 107.4 lb

## 2016-04-26 DIAGNOSIS — J209 Acute bronchitis, unspecified: Secondary | ICD-10-CM | POA: Diagnosis not present

## 2016-04-26 DIAGNOSIS — R059 Cough, unspecified: Secondary | ICD-10-CM

## 2016-04-26 DIAGNOSIS — J0100 Acute maxillary sinusitis, unspecified: Secondary | ICD-10-CM

## 2016-04-26 DIAGNOSIS — R05 Cough: Secondary | ICD-10-CM

## 2016-04-26 LAB — CBC WITH DIFFERENTIAL/PLATELET
BASOS PCT: 0.5 % (ref 0.0–3.0)
Basophils Absolute: 0 10*3/uL (ref 0.0–0.1)
EOS PCT: 1.7 % (ref 0.0–5.0)
Eosinophils Absolute: 0.1 10*3/uL (ref 0.0–0.7)
HEMATOCRIT: 39 % (ref 36.0–46.0)
HEMOGLOBIN: 13 g/dL (ref 12.0–15.0)
Lymphocytes Relative: 25.6 % (ref 12.0–46.0)
Lymphs Abs: 1.6 10*3/uL (ref 0.7–4.0)
MCHC: 33.5 g/dL (ref 30.0–36.0)
MCV: 93.4 fl (ref 78.0–100.0)
MONO ABS: 0.4 10*3/uL (ref 0.1–1.0)
MONOS PCT: 6.2 % (ref 3.0–12.0)
Neutro Abs: 4.1 10*3/uL (ref 1.4–7.7)
Neutrophils Relative %: 66 % (ref 43.0–77.0)
Platelets: 320 10*3/uL (ref 150.0–400.0)
RBC: 4.18 Mil/uL (ref 3.87–5.11)
RDW: 12.9 % (ref 11.5–15.5)
WBC: 6.2 10*3/uL (ref 4.0–10.5)

## 2016-04-26 MED ORDER — FLUTICASONE PROPIONATE 50 MCG/ACT NA SUSP: 2.0000 | Freq: Every day | NASAL | 1 refills | Status: DC

## 2016-04-26 MED ORDER — BENZONATATE 100 MG PO CAPS: 100.0000 mg | ORAL_CAPSULE | Freq: Three times a day (TID) | ORAL | 0 refills | Status: DC | PRN

## 2016-04-26 MED ORDER — ALBUTEROL SULFATE HFA 108 (90 BASE) MCG/ACT IN AERS
2.0000 | INHALATION_SPRAY | Freq: Four times a day (QID) | RESPIRATORY_TRACT | 0 refills | Status: DC | PRN
Start: 2016-04-26 — End: 2016-06-05

## 2016-04-26 MED ORDER — DOXYCYCLINE HYCLATE 100 MG PO TABS: 100.0000 mg | ORAL_TABLET | Freq: Two times a day (BID) | ORAL | 0 refills | Status: DC

## 2016-04-26 NOTE — Progress Notes (Signed)
Subjective:    Patient ID: Lisa Levine, female    DOB: March 11, 1976, 40 y.o.   MRN: [id number]  HPI  Pt in for feeling sick day before thanksgiving. She states she thought stress related. Pt has diabetes and her sugar have been in high 300's most of the time. Pt can't sleep due to coughing. Pt is bringing up some mucous early on. She is wheezing. No hx of asthma.  Pt is on insulin pump. Pt last saw endocrinologist. Will see endocrinologist next month.  Pt left message with endocrine nurse this Friday.    Review of Systems  Constitutional: Positive for fever. Negative for chills.       Day after thanksgiving just one day.  HENT: Positive for congestion and sinus pressure. Negative for ear pain, sore throat, tinnitus and voice change.   Respiratory: Positive for cough and wheezing. Negative for chest tightness and shortness of breath.        Wheezing just little bit.  Minimal recently.  Cardiovascular: Negative for chest pain and palpitations.  Gastrointestinal: Negative for abdominal pain.  Musculoskeletal: Negative for back pain.  Skin: Negative for rash.  Neurological: Negative for dizziness, seizures, weakness and light-headedness.  Hematological: Negative for adenopathy. Does not bruise/bleed easily.  Psychiatric/Behavioral: Negative for behavioral problems, decreased concentration, dysphoric mood and sleep disturbance.    Past Medical History:  Diagnosis Date  . Allergy   . Arthritis   . Diabetes mellitus without complication ([place])   . DVT (deep venous thrombosis) ([place])   . Hyperlipidemia   . Hypertension   . Peripheral neuropathy ([place])   . Thyroid disease   . Vitamin D deficiency      Social History   Social History  . Marital status: Divorced    Spouse name: N/A  . Number of children: N/A  . Years of education: N/A   Occupational History  . Not on file.   Social History Main Topics  . Smoking status: Never Smoker  . Smokeless tobacco: Not on file  . Alcohol  use No  . Drug use: No  . Sexual activity: Yes   Other Topics Concern  . Not on file   Social History Narrative  . No narrative on file    Past Surgical History:  Procedure Laterality Date  . insulin pump      Family History  Problem Relation Age of Onset  . Cancer Mother   . Cancer Father   . Hyperlipidemia Father   . Hypertension Father     No Known Allergies  Current Outpatient Prescriptions on File Prior to Visit  Medication Sig Dispense Refill  . amitriptyline (ELAVIL) 10 MG tablet Take 10 mg by mouth at bedtime.    Marland Kitchen atorvastatin (LIPITOR) 10 MG tablet Take 1 tablet (10 mg total) by mouth daily. 30 tablet 3  . gabapentin (NEURONTIN) 600 MG tablet Take 600 mg by mouth 4 (four) times daily.    . insulin regular (NOVOLIN R,HUMULIN R) 100 units/mL injection Inject into the skin 3 (three) times daily before meals.     No current facility-administered medications on file prior to visit.     BP 98/68 (BP Location: Left Arm, Patient Position: Sitting, Cuff Size: Normal)   Pulse 87   Temp 98.9 F (37.2 C) (Oral)   Ht 4' 11.5" (1.511 m)   Wt 107 lb 6.4 oz (48.7 kg)   SpO2 98%   BMI 21.33 kg/m       Objective:   Physical  Exam   General  Mental Status - Alert. General Appearance - Well groomed. Not in acute distress.  Skin Rashes- No Rashes.  HEENT Head- Normal. Ear Auditory Canal - Left- Normal. Right - Normal.Tympanic Membrane- Left- Normal. Right- Normal. Eye Sclera/Conjunctiva- Left- Normal. Right- Normal. Nose & Sinuses Nasal Mucosa- Left-  Boggy and Congested. Right-  Boggy and  Congested.Bilateral maxillary and frontal sinus pressure. Mouth & Throat Lips: Upper Lip- Normal: no dryness, cracking, pallor, cyanosis, or vesicular eruption. Lower Lip-Normal: no dryness, cracking, pallor, cyanosis or vesicular eruption. Buccal Mucosa- Bilateral- No Aphthous ulcers. Oropharynx- No Discharge or Erythema. Tonsils: Characteristics- Bilateral- No Erythema or  Congestion. Size/Enlargement- Bilateral- No enlargement. Discharge- bilateral-None.  Neck Neck- Supple. No Masses.   Chest and Lung Exam Auscultation: Breath Sounds:-Clear even and unlabored.  Cardiovascular Auscultation:Rythm- Regular, rate and rhythm. Murmurs & Other Heart Sounds:Ausculatation of the heart reveal- No Murmurs.  Lymphatic Head & Neck General Head & Neck Lymphatics: Bilateral: Description- No Localized lymphadenopathy.      Assessment & Plan:   You appear to have bronchitis and sinusitis. Rest hydrate and tylenol for fever. I am prescribing cough medicine benzonatate, and doxycycline  antibiotic. For your nasal congestion rx flonase nasal spray.  Since you illness is prolonged will get cbc and cxr today.  For wheezing will rx albuterol.  Please contact you endocrinologist to get advise on recent high sugars. Maybe make adjustment sooner.(pt notes she is waiting and should get call back today)  Follow up in 7-10 days or as needed

## 2016-04-26 NOTE — Patient Instructions (Addendum)
You appear to have bronchitis and sinusitis. Rest hydrate and tylenol for fever. I am prescribing cough medicine benzonatate, and doxycycline  antibiotic. For your nasal congestion rx flonase nasal spray.  Since you illness is prolonged will get cbc and cxr today.  For wheezing rx albuterol  Please contact you endocrinologist to get advise on recent high sugars. Maybe make adjustment sooner.  Follow up in 7-10 days or as needed

## 2016-06-04 ENCOUNTER — Observation Stay (HOSPITAL_BASED_OUTPATIENT_CLINIC_OR_DEPARTMENT_OTHER)
Admission: EM | Admit: 2016-06-04 | Discharge: 2016-06-05 | Disposition: A | Discharge status: Home / Self Care | Payer: Medicare Other | Attending: Family Medicine | Admitting: Family Medicine

## 2016-06-04 ENCOUNTER — Emergency Department (HOSPITAL_BASED_OUTPATIENT_CLINIC_OR_DEPARTMENT_OTHER): Payer: Medicare Other

## 2016-06-04 ENCOUNTER — Encounter (HOSPITAL_BASED_OUTPATIENT_CLINIC_OR_DEPARTMENT_OTHER): Payer: Self-pay | Admitting: *Deleted

## 2016-06-04 DIAGNOSIS — I10 Essential (primary) hypertension: Secondary | ICD-10-CM | POA: Diagnosis not present

## 2016-06-04 DIAGNOSIS — E1142 Type 2 diabetes mellitus with diabetic polyneuropathy: Secondary | ICD-10-CM | POA: Diagnosis not present

## 2016-06-04 DIAGNOSIS — M1990 Unspecified osteoarthritis, unspecified site: Secondary | ICD-10-CM | POA: Diagnosis not present

## 2016-06-04 DIAGNOSIS — R202 Paresthesia of skin: Secondary | ICD-10-CM

## 2016-06-04 DIAGNOSIS — E785 Hyperlipidemia, unspecified: Secondary | ICD-10-CM | POA: Diagnosis not present

## 2016-06-04 DIAGNOSIS — Z794 Long term (current) use of insulin: Secondary | ICD-10-CM | POA: Insufficient documentation

## 2016-06-04 DIAGNOSIS — E119 Type 2 diabetes mellitus without complications: Secondary | ICD-10-CM

## 2016-06-04 DIAGNOSIS — R51 Headache: Secondary | ICD-10-CM | POA: Diagnosis not present

## 2016-06-04 DIAGNOSIS — R55 Syncope and collapse: Secondary | ICD-10-CM | POA: Diagnosis not present

## 2016-06-04 DIAGNOSIS — Z86718 Personal history of other venous thrombosis and embolism: Secondary | ICD-10-CM | POA: Diagnosis not present

## 2016-06-04 DIAGNOSIS — E559 Vitamin D deficiency, unspecified: Secondary | ICD-10-CM | POA: Insufficient documentation

## 2016-06-04 DIAGNOSIS — G43909 Migraine, unspecified, not intractable, without status migrainosus: Secondary | ICD-10-CM | POA: Insufficient documentation

## 2016-06-04 DIAGNOSIS — R200 Anesthesia of skin: Secondary | ICD-10-CM

## 2016-06-04 DIAGNOSIS — IMO0001 Reserved for inherently not codable concepts without codable children: Secondary | ICD-10-CM

## 2016-06-04 HISTORY — DX: Migraine, unspecified, not intractable, without status migrainosus: G43.909

## 2016-06-04 LAB — CBC
HCT: 37.4 % (ref 36.0–46.0)
HEMOGLOBIN: 12.5 g/dL (ref 12.0–15.0)
MCH: 31.6 pg (ref 26.0–34.0)
MCHC: 33.4 g/dL (ref 30.0–36.0)
MCV: 94.7 fL (ref 78.0–100.0)
PLATELETS: 305 10*3/uL (ref 150–400)
RBC: 3.95 MIL/uL (ref 3.87–5.11)
RDW: 11.5 % (ref 11.5–15.5)
WBC: 7.6 10*3/uL (ref 4.0–10.5)

## 2016-06-04 LAB — TROPONIN I

## 2016-06-04 LAB — BASIC METABOLIC PANEL
Anion gap: 8 (ref 5–15)
BUN: 11 mg/dL (ref 6–20)
CALCIUM: 9.1 mg/dL (ref 8.9–10.3)
CHLORIDE: 101 mmol/L (ref 101–111)
CO2: 27 mmol/L (ref 22–32)
CREATININE: 0.57 mg/dL (ref 0.44–1.00)
GFR calc Af Amer: >60 mL/min (ref >60)
GFR calc non Af Amer: >60 mL/min (ref >60)
GLUCOSE: 161 mg/dL — AB (ref 65–99)
Potassium: 3 mmol/L — ABNORMAL LOW (ref 3.5–5.1)
Sodium: 136 mmol/L (ref 135–145)

## 2016-06-04 LAB — URINALYSIS, ROUTINE W REFLEX MICROSCOPIC
BILIRUBIN URINE: NEGATIVE
GLUCOSE, UA: NEGATIVE mg/dL
HGB URINE DIPSTICK: NEGATIVE
Ketones, ur: NEGATIVE mg/dL
Leukocytes, UA: NEGATIVE
Nitrite: NEGATIVE
Protein, ur: NEGATIVE mg/dL
SPECIFIC GRAVITY, URINE: 1.011 (ref 1.005–1.030)
pH: 6.5 (ref 5.0–8.0)

## 2016-06-04 LAB — CBG MONITORING, ED: GLUCOSE-CAPILLARY: 159 mg/dL — AB (ref 65–99)

## 2016-06-04 MED ORDER — METOCLOPRAMIDE HCL 5 MG/ML IJ SOLN
10.0000 mg | Freq: Once | INTRAMUSCULAR | Status: AC
  Administered 2016-06-04: 10 mg via INTRAVENOUS
  Filled 2016-06-04: qty 2

## 2016-06-04 MED ORDER — POTASSIUM CHLORIDE CRYS ER 20 MEQ PO TBCR
40.0000 meq | EXTENDED_RELEASE_TABLET | Freq: Once | ORAL | Status: AC
  Administered 2016-06-04: 40 meq via ORAL
  Filled 2016-06-04: qty 2

## 2016-06-04 MED ORDER — KETOROLAC TROMETHAMINE 30 MG/ML IJ SOLN
30.0000 mg | Freq: Once | INTRAMUSCULAR | Status: AC
  Administered 2016-06-04: 30 mg via INTRAVENOUS
  Filled 2016-06-04: qty 1

## 2016-06-04 MED ORDER — ENSURE ENLIVE PO LIQD: 237.0000 mL | Freq: Two times a day (BID) | ORAL | Status: DC

## 2016-06-04 NOTE — ED Triage Notes (Signed)
She drove home from work after having a severe headache and pressure in chest. Numbness on her left side. Hx of diabetes and neuropathy. After she got inside her house she woke up on the bathroom floor. She is alert on arrival. Drove herself here.

## 2016-06-04 NOTE — Plan of Care (Addendum)
41 yo F with h/o IDDM, HTN.  Headache when she came home from work, had episode of syncope, drove self to [facility].  Patient needs CVA workup, still having tingling and numbness on R side.  Replacing K.  BGLs have been good in ED, dont know what it was just after syncope episode though (presumably EMS took it though but didn't get communicated so presumably was also okay).  Going to tele obs.  Please page flow manager on patient arrival to unit.

## 2016-06-04 NOTE — ED Notes (Signed)
Pt ambulated to restroom without assistance, only stand by as c/o dizziness.

## 2016-06-04 NOTE — ED Notes (Signed)
texting using both hands without difficulty

## 2016-06-04 NOTE — ED Provider Notes (Signed)
Emergency Department Provider Note   I have reviewed the triage vital signs and the nursing notes.   HISTORY  Chief Complaint Loss of Consciousness   HPI Lisa Levine is a 41 y.o. female with PMH IDDM, arthritis, HTN, and peripheral neuropathy presents to the emergency department for evaluation of intermittent headache for the past 3 days with episode of syncope today with associated chest pain. Patient states that she's had this intermittent posterior headache for the last 3 days. It seems to resolve spontaneously. She does not have any numbness or weakness along with this headache other than her baseline peripheral neuropathy. She again had a headache at work today and began to feel nauseated. She left work early because of the nausea and upon walking to the door of her house she felt a sudden onset chest tightness and became very lightheaded. Next thing the patient notes she had made it to her bathroom floor and woke up there. At that time, approximately 2:45 PM, she noted some left sided tingling and numbness. She states that the numbness/burning in her left lower extremity seems like her neuropathy but she continues to have residual tingling and numbness on the left side of her face and left upper extremity. No appreciable weakness. She is also complaining of some mild blurry vision along with continued headache. Her chest pain has resolved.   Past Medical History:  Diagnosis Date  . Allergy   . Arthritis   . DVT (deep venous thrombosis) ([place])    "often on my LLE" (06/04/2016)  . Headache    "probably weekly" (06/04/2016)  . Hyperlipidemia   . Hypertension   . Migraine    "q couple months" (06/04/2016)  . Peripheral neuropathy ([place])   . Thyroid disease   . Type II diabetes mellitus ([place])   . Vitamin D deficiency     Patient Active Problem List   Diagnosis Date Noted  . Migraine 06/05/2016  . Syncope 06/04/2016  . Sinusitis, acute maxillary 05/22/2015  . Allergic rhinitis  02/14/2015  . Diabetic neuropathy ([place]) 02/14/2015  . Hyperlipidemia 02/14/2015  . Low serum vitamin D 02/14/2015  . Hyperthyroidism 02/14/2015  . Skin lesion 02/14/2015  . IDDM (insulin dependent diabetes mellitus) ([place]) 02/14/2015    Past Surgical History:  Procedure Laterality Date  . insulin pump  2005-present   "upgrades q 3 yrs" (06/04/2016)      Allergies Patient has no known allergies.  Family History  Problem Relation Age of Onset  . Cancer Mother   . Cancer Father   . Hyperlipidemia Father   . Hypertension Father     Social History Social History  Substance Use Topics  . Smoking status: Never Smoker  . Smokeless tobacco: Never Used  . Alcohol use No    Review of Systems  Constitutional: No fever/chills Eyes: No visual changes. ENT: No sore throat. Cardiovascular: Positive chest pain and syncope.  Respiratory: Denies shortness of breath. Gastrointestinal: No abdominal pain.  No nausea, no vomiting.  No diarrhea.  No constipation. Genitourinary: Negative for dysuria. Musculoskeletal: Negative for back pain. Skin: Negative for rash. Neurological: Positive HA. Positive left face, LUE, and LLE numbness and tingling.   10-point ROS otherwise negative.  ____________________________________________   PHYSICAL EXAM:  VITAL SIGNS: ED Triage Vitals  Enc Vitals Group     BP 06/04/16 1609 122/74     Pulse Rate 06/04/16 1609 76     Resp 06/04/16 1609 18     Temp 06/04/16 1609 98.5 F (  36.9 C)     Temp Source 06/04/16 1609 Oral     SpO2 06/04/16 1609 100 %     Weight 06/04/16 1609 105 lb (47.6 kg)     Height 06/04/16 1609 4\' 10"  (1.473 m)     Pain Score 06/04/16 1617 8   Constitutional: Alert and oriented. Well appearing and in no acute distress. Eyes: Conjunctivae are normal. PERRL. EOMI. Head: Atraumatic. Nose: No congestion/rhinnorhea. Mouth/Throat: Mucous membranes are moist. Neck: No stridor.   Cardiovascular: Normal rate, regular rhythm.  Good peripheral circulation. Grossly normal heart sounds.   Respiratory: Normal respiratory effort.  No retractions. Lungs CTAB. Gastrointestinal: Soft and nontender. No distention.  Musculoskeletal: No lower extremity tenderness nor edema. No gross deformities of extremities. Neurologic:  Normal speech and language. Decreased sensation to light touch to the left face. No weakness or other CN findings. Decreased sensation to light touch to the LUE and LLE. Slight pronator drift in the LUE.  Skin:  Skin is warm, dry and intact. No rash noted. Psychiatric: Mood and affect are normal. Speech and behavior are normal.  ____________________________________________   LABS (all labs ordered are listed, but only abnormal results are displayed)  Labs Reviewed  BASIC METABOLIC PANEL - Abnormal; Notable for the following:       Result Value   Potassium 3.0 (*)    Glucose, Bld 161 (*)    All other components within normal limits  GLUCOSE, CAPILLARY - Abnormal; Notable for the following:    Glucose-Capillary 243 (*)    All other components within normal limits  GLUCOSE, CAPILLARY - Abnormal; Notable for the following:    Glucose-Capillary 119 (*)    All other components within normal limits  CBG MONITORING, ED - Abnormal; Notable for the following:    Glucose-Capillary 159 (*)    All other components within normal limits  CBC  URINALYSIS, ROUTINE W REFLEX MICROSCOPIC  TROPONIN I  BASIC METABOLIC PANEL  MAGNESIUM  CBG MONITORING, ED   ____________________________________________  EKG   EKG Interpretation  Date/Time:  Friday June 04 2016 16:21:25 EST Ventricular Rate:  79 PR Interval:  124 QRS Duration: 82 QT Interval:  386 QTC Calculation: 442 R Axis:   93 Text Interpretation:  Normal sinus rhythm Rightward axis Borderline ECG No STEMI.  Confirmed by LONG MD, JOSHUA [phone]) on 06/04/2016 4:34:19 PM       ____________________________________________  RADIOLOGY  Ct Head Wo  Contrast  Result Date: 06/04/2016 CLINICAL DATA:  Headache/head pain especially in the occipital region. Loss of consciousness today striking the back of the head. Diabetes. EXAM: CT HEAD WITHOUT CONTRAST TECHNIQUE: Contiguous axial images were obtained from the base of the skull through the vertex without intravenous contrast. COMPARISON:  02/25/2003 FINDINGS: Brain: The brainstem, cerebellum, cerebral peduncles, thalami, basal ganglia, basilar cisterns, and ventricular system appear within normal limits. No intracranial hemorrhage, mass lesion, or acute CVA. Vascular: Unremarkable Skull: Unremarkable Sinuses/Orbits: Unremarkable where included. Other: No supplemental non-categorized findings. IMPRESSION: 1.  No significant abnormality identified. Electronically Signed   By: Van Clines M.D.   On: 06/04/2016 17:24    ____________________________________________   PROCEDURES  Procedure(s) performed:   Procedures  None ____________________________________________   INITIAL IMPRESSION / ASSESSMENT AND PLAN / ED COURSE  Pertinent labs & imaging results that were available during my care of the patient were reviewed by me and considered in my medical decision making (see chart for details).  Patient presents to the emergency department with a constellation of concerning  symptoms including syncope with some chest pain and left-sided tingling and numbness upon waking from her syncope. CT scan of the head performed in triage is negative. Onset of symptoms was approximately 2:45 PM. The patient has some decreased sensation to light touch over the left face and arm. No continued CP. Plan to treat headache as Koplik's migraine is in the differential but would not explain her chest pain and syncope. Plan to discuss the case with neurology to discuss further imaging. NIHSS is vry low and with complicating history of chest pain and syncope I do not feel that this person is a good tPA candidate.    06:38 PM Patient is feeling much better after HA medication. Neuro symptoms have mostly resolved. Continues to have a tingling sensation in the left fingers only. No weakness or numbness.   07:37 PM Spoke with Dr. Cheral Marker with Neurology regarding the case. We are in agreement that the patient requires a full CVA w/u with sudden onset symptoms. Will give ASA. Patient also will benefit from admission for syncope evaluation. Discussed this with the patient and husband at bedside and paged the hospitalist for admission.    Discussed patient's case with hospitalist, Dr. Alcario Drought.  Recommend admission to obs, tele bed.  I will place holding orders per their request. Patient and family (if present) updated with plan. Care transferred to hospitalist service.  I reviewed all nursing notes, vitals, pertinent old records, EKGs, labs, imaging (as available).  ____________________________________________  FINAL CLINICAL IMPRESSION(S) / ED DIAGNOSES  Final diagnoses:  Syncope, unspecified syncope type  Numbness and tingling     MEDICATIONS GIVEN DURING THIS VISIT:  Medications  feeding supplement (ENSURE ENLIVE) (ENSURE ENLIVE) liquid 237 mL (not administered)  gabapentin (NEURONTIN) tablet 600 mg (600 mg Oral Given 06/05/16 0048)  atorvastatin (LIPITOR) tablet 10 mg (not administered)  albuterol (PROVENTIL) (2.5 MG/3ML) 0.083% nebulizer solution 3 mL (not administered)  insulin pump ( Subcutaneous Not Given 06/05/16 0628)  sodium chloride flush (NS) 0.9 % injection 3 mL (3 mLs Intravenous Given 06/05/16 0048)  enoxaparin (LOVENOX) injection 40 mg (not administered)  ondansetron (ZOFRAN) injection 4 mg (not administered)  ketorolac (TORADOL) 30 MG/ML injection 30 mg (30 mg Intravenous Given 06/04/16 1755)  metoCLOPramide (REGLAN) injection 10 mg (10 mg Intravenous Given 06/04/16 1756)  potassium chloride SA (K-DUR,KLOR-CON) CR tablet 40 mEq (40 mEq Oral Given 06/04/16 1957)     NEW OUTPATIENT  MEDICATIONS STARTED DURING THIS VISIT:  None   Note:  This document was prepared using Dragon voice recognition software and may include unintentional dictation errors.  Nanda Quinton, MD Emergency Medicine  Margette Fast, MD 06/05/16 [phone]

## 2016-06-05 ENCOUNTER — Observation Stay (HOSPITAL_BASED_OUTPATIENT_CLINIC_OR_DEPARTMENT_OTHER): Payer: Medicare Other

## 2016-06-05 ENCOUNTER — Observation Stay (HOSPITAL_COMMUNITY): Payer: Medicare Other

## 2016-06-05 DIAGNOSIS — G43909 Migraine, unspecified, not intractable, without status migrainosus: Secondary | ICD-10-CM | POA: Diagnosis present

## 2016-06-05 DIAGNOSIS — R51 Headache: Secondary | ICD-10-CM | POA: Diagnosis not present

## 2016-06-05 DIAGNOSIS — R55 Syncope and collapse: Secondary | ICD-10-CM

## 2016-06-05 DIAGNOSIS — G43109 Migraine with aura, not intractable, without status migrainosus: Secondary | ICD-10-CM | POA: Diagnosis not present

## 2016-06-05 DIAGNOSIS — Z794 Long term (current) use of insulin: Secondary | ICD-10-CM | POA: Diagnosis not present

## 2016-06-05 DIAGNOSIS — E119 Type 2 diabetes mellitus without complications: Secondary | ICD-10-CM

## 2016-06-05 LAB — GLUCOSE, CAPILLARY
GLUCOSE-CAPILLARY: 119 mg/dL — AB (ref 65–99)
Glucose-Capillary: 243 mg/dL — ABNORMAL HIGH (ref 65–99)
Glucose-Capillary: 346 mg/dL — ABNORMAL HIGH (ref 65–99)

## 2016-06-05 LAB — BASIC METABOLIC PANEL
ANION GAP: 10 (ref 5–15)
BUN: 6 mg/dL (ref 6–20)
CO2: 23 mmol/L (ref 22–32)
Calcium: 8.5 mg/dL — ABNORMAL LOW (ref 8.9–10.3)
Chloride: 103 mmol/L (ref 101–111)
Creatinine, Ser: 0.65 mg/dL (ref 0.44–1.00)
GFR calc Af Amer: >60 mL/min (ref >60)
GLUCOSE: 320 mg/dL — AB (ref 65–99)
POTASSIUM: 4 mmol/L (ref 3.5–5.1)
Sodium: 136 mmol/L (ref 135–145)

## 2016-06-05 LAB — ECHOCARDIOGRAM COMPLETE
HEIGHTINCHES: 58 in
Weight: 1723.2 oz

## 2016-06-05 LAB — MAGNESIUM: MAGNESIUM: 1.8 mg/dL (ref 1.7–2.4)

## 2016-06-05 MED ORDER — ALBUTEROL SULFATE (2.5 MG/3ML) 0.083% IN NEBU: 3.0000 mL | INHALATION_SOLUTION | Freq: Four times a day (QID) | RESPIRATORY_TRACT | Status: DC | PRN

## 2016-06-05 MED ORDER — GABAPENTIN 600 MG PO TABS
600.0000 mg | ORAL_TABLET | Freq: Four times a day (QID) | ORAL | Status: DC
  Administered 2016-06-05 (×2): 600 mg via ORAL
  Filled 2016-06-05 (×2): qty 1

## 2016-06-05 MED ORDER — SODIUM CHLORIDE 0.9% FLUSH
3.0000 mL | Freq: Two times a day (BID) | INTRAVENOUS | Status: DC
  Administered 2016-06-05 (×2): 3 mL via INTRAVENOUS

## 2016-06-05 MED ORDER — ATORVASTATIN CALCIUM 10 MG PO TABS
10.0000 mg | ORAL_TABLET | Freq: Every day | ORAL | Status: DC
  Administered 2016-06-05: 10 mg via ORAL
  Filled 2016-06-05: qty 1

## 2016-06-05 MED ORDER — INSULIN PUMP
Freq: Three times a day (TID) | SUBCUTANEOUS | Status: DC
Start: 2016-06-05 — End: 2016-06-05
  Administered 2016-06-05: 1 via SUBCUTANEOUS
  Administered 2016-06-05: 02:00:00 via SUBCUTANEOUS
  Filled 2016-06-05: qty 1

## 2016-06-05 MED ORDER — ENOXAPARIN SODIUM 40 MG/0.4ML ~~LOC~~ SOLN: 40.0000 mg | SUBCUTANEOUS | Status: DC

## 2016-06-05 MED ORDER — SUMATRIPTAN SUCCINATE 25 MG PO TABS: 25.0000 mg | ORAL_TABLET | ORAL | 0 refills | Status: DC | PRN

## 2016-06-05 MED ORDER — ONDANSETRON HCL 4 MG/2ML IJ SOLN: 4.0000 mg | Freq: Four times a day (QID) | INTRAMUSCULAR | Status: DC | PRN

## 2016-06-05 NOTE — Discharge Instructions (Signed)
Call your neurologist to make an appointment and discuss your brain imaging.  Syncope Introduction Syncope is when you lose temporarily pass out (faint). Signs that you may be about to pass out include:  Feeling dizzy or light-headed.  Feeling sick to your stomach (nauseous).  Seeing all white or all black.  Having cold, clammy skin. If you passed out, get help right away. Call your local emergency services (911 in the [place] Do not drive yourself to the hospital. Follow these instructions at home: Pay attention to any changes in your symptoms. Take these actions to help with your condition:  Have someone stay with you until you feel stable.  Do not drive, use machinery, or play sports until your doctor says it is okay.  Keep all follow-up visits as told by your doctor. This is important.  If you start to feel like you might pass out, lie down right away and raise (elevate) your feet above the level of your heart. Breathe deeply and steadily. Wait until all of the symptoms are gone.  Drink enough fluid to keep your pee (urine) clear or pale yellow.  If you are taking blood pressure or heart medicine, get up slowly and spend many minutes getting ready to sit and then stand. This can help with dizziness.  Take over-the-counter and prescription medicines only as told by your doctor. Get help right away if:  You have a very bad headache.  You have unusual pain in your chest, tummy, or back.  You are bleeding from your mouth or rectum.  You have black or tarry poop (stool).  You have a very fast or uneven heartbeat (palpitations).  It hurts to breathe.  You pass out once or more than once.  You have jerky movements that you cannot control (seizure).  You are confused.  You have trouble walking.  You are very weak.  You have vision problems. These symptoms may be an emergency. Do not wait to see if the symptoms will go away. Get medical help right away. Call your  local emergency services (911 in the [place] Do not drive yourself to the hospital.  This information is not intended to replace advice given to you by your health care provider. Make sure you discuss any questions you have with your health care provider. Document Released: 10/27/2007 Document Revised: 10/16/2015 Document Reviewed: 01/22/2015  2017 [employer]vier

## 2016-06-05 NOTE — Progress Notes (Signed)
Initial Nutrition Assessment  DOCUMENTATION CODES:   Not applicable  INTERVENTION:   Ensure Enlive po BID, each supplement provides 350 kcal and 20 grams of protein  NUTRITION DIAGNOSIS:   Inadequate oral intake related to poor appetite as evidenced by per patient/family report.  GOAL:   Patient will meet greater than or equal to 90% of their needs  MONITOR:   PO intake, Supplement acceptance, Labs  REASON FOR ASSESSMENT:   Malnutrition Screening Tool    ASSESSMENT:   40 y.o. female with medical history significant of IDDM, migraines.  Patient presents to the ED at [facility] after a syncope episode.  Patient was having a migraine which ended up being worse than usual today at work.  She went home, and on arriving home she was also having severe nausea with her migraine.  Nausea became more severe and she became lightheaded and the last thing she remembers was holding on to a banister before she passed out.   Met with pt in room today. Pt reports eating 75% meals. Pt reports intermittent poor appetite pta r/t metallic taste changes. Pt has been on insulin for 105yr and feels good about her insulin pump. From talking to pt, it sounds like she may be restricting carbohydrates too much. Provided diabetes education today. Per chart, pt is weight stable.   Medications reviewed and include: Lovenox, insulin  Labs reviewed: K 3.0(L)  Nutrition-Focused physical exam completed. Findings are no fat depletion, mild muscle depletion in clavicles and patella region, and no edema.   Diet Order:  Diet Carb Modified Fluid consistency: Thin; Room service appropriate? Yes  Skin:  Reviewed, no issues  Last BM:  1/12  Height:   Ht Readings from Last 1 Encounters:  06/04/16 _0  (1.473 m)    Weight:   Wt Readings from Last 1 Encounters:  06/05/16 107 lb 11.2 oz (48.9 kg)    Ideal Body Weight:  44 kg  BMI:  Body mass index is 22.51 kg/m.  Estimated Nutritional Needs:   Kcal:   1300-1600kcal/day   Protein:  49-58g/day   Fluid:  >1.3L/day   EDUCATION NEEDS:   Education needs addressed  CKoleen Distance RD, LDN Pager #- [phone]

## 2016-06-05 NOTE — Progress Notes (Signed)
Patient has her Insulin pump from home and knows how to use it and is alert and orin. Basal rate is at 0.750 units /hour. She has all her supplies.

## 2016-06-05 NOTE — Progress Notes (Signed)
  RD consulted for nutrition education regarding diabetes.   Lab Results  Component Value Date   HGBA1C 8.9 (H) 02/14/2015     Discussed different food groups and their effects on blood sugar, emphasizing carbohydrate-containing foods. Provided list of carbohydrates and recommended serving sizes of common foods.  Discussed importance of controlled and consistent carbohydrate intake throughout the day. Provided examples of ways to balance meals/snacks and encouraged intake of high-fiber, whole grain complex carbohydrates. Teach back method used.  Expect good compliance.  Body mass index is 22.51 kg/m. Pt meets criteria for normal based on current BMI.  Current diet order is carbohydrate controlled, patient is consuming approximately 75% of meals at this time. Labs and medications reviewed. No further nutrition interventions warranted at this time. RD contact information provided. If additional nutrition issues arise, please re-consult RD.  Koleen Distance, RD, LDN Pager #- [phone]

## 2016-06-05 NOTE — Progress Notes (Signed)
[facility] progress note  Patient doing well this morning. Headache is now gone. Patient denies any fevers, chills, nausea, vomiting, chest pain, shortness of breath. Patient anxious to go home.  Physical exam: Alert and oriented 3, normal neurological exam, heart regular rate and rhythm no murmurs rubs or gallops, chest clear to auscultation throughout and normal work of breathing.  Assessment and plan Migraine with syncope likely vasovagal Reviewed and agree with assessment and plan of Dr. Alcario Drought. Discussed Imitrex as an option as an abortificant for migraines. Plan for discharge later today.  Elwin Mocha MD

## 2016-06-05 NOTE — H&P (Addendum)
History and Physical    Lisa Levine [id number] DOB: [birth year] DOA: 06/04/2016   PCP: Mackie Pai, PA-C Chief Complaint:  Chief Complaint  Patient presents with  . Loss of Consciousness    HPI: Lisa Levine is a 41 y.o. female with medical history significant of IDDM, migraines.  Patient presents to the ED at [facility] after a syncope episode.  Patient was having a migraine which ended up being worse than usual today at work.  She went home, and on arriving home she was also having severe nausea with her migraine.  Nausea became more severe and she became lightheaded and the last thing she remembers was holding on to a banister before she passed out.  She woke up laying on her R side on her marble floor and had tingling and numbness in R side which has slowly resolved completely.  Per EMS CBG was 90 on scene.  ED Course: EDP sending patient for tele obs for possible CVA.  Review of Systems: As per HPI otherwise 10 point review of systems negative.    Past Medical History:  Diagnosis Date  . Allergy   . Arthritis   . DVT (deep venous thrombosis) ([place])    "often on my LLE" (06/04/2016)  . Headache    "probably weekly" (06/04/2016)  . Hyperlipidemia   . Hypertension   . Migraine    "q couple months" (06/04/2016)  . Peripheral neuropathy ([place])   . Thyroid disease   . Type II diabetes mellitus ([place])   . Vitamin D deficiency     Past Surgical History:  Procedure Laterality Date  . insulin pump  2005-present   "upgrades q 3 yrs" (06/04/2016)     reports that she has never smoked. She has never used smokeless tobacco. She reports that she does not drink alcohol or use drugs.  No Known Allergies  Family History  Problem Relation Age of Onset  . Cancer Mother   . Cancer Father   . Hyperlipidemia Father   . Hypertension Father       Prior to Admission medications   Medication Sig Start Date End Date Taking? Authorizing Provider  albuterol (PROVENTIL HFA;VENTOLIN HFA) 108 (90  Base) MCG/ACT inhaler Inhale 2 puffs into the lungs every 6 (six) hours as needed for wheezing or shortness of breath. 04/26/16   Percell Miller Saguier, PA-C  amitriptyline (ELAVIL) 10 MG tablet Take 10 mg by mouth at bedtime.    Historical Provider, MD  atorvastatin (LIPITOR) 10 MG tablet Take 1 tablet (10 mg total) by mouth daily. 02/14/15   Percell Miller Saguier, PA-C  benzonatate (TESSALON) 100 MG capsule Take 1 capsule (100 mg total) by mouth 3 (three) times daily as needed for cough. 04/26/16   Percell Miller Saguier, PA-C  doxycycline (VIBRA-TABS) 100 MG tablet Take 1 tablet (100 mg total) by mouth 2 (two) times daily. Can give caps or generic 04/26/16   Mackie Pai, PA-C  fluticasone [facility]) 50 MCG/ACT nasal spray Place 2 sprays into both nostrils daily. 04/26/16   Percell Miller Saguier, PA-C  gabapentin (NEURONTIN) 600 MG tablet Take 600 mg by mouth 4 (four) times daily.    Historical Provider, MD  insulin regular (NOVOLIN R,HUMULIN R) 100 units/mL injection Inject into the skin 3 (three) times daily before meals.    Historical Provider, MD    Physical Exam: Vitals:   06/04/16 1900 06/04/16 1958 06/04/16 2001 06/04/16 2158  BP: 103/56  105/76 (!) 97/58  Pulse: 84  60 69  Resp: 15  14  18  Temp:  97.9 F (36.6 C)  97.8 F (36.6 C)  TempSrc:  Oral  Oral  SpO2: 99%  99% 99%  Weight:    48.3 kg (106 lb 6.4 oz)  Height:    4\' 10"  (1.473 m)      Constitutional: NAD, calm, comfortable Eyes: PERRL, lids and conjunctivae normal ENMT: Mucous membranes are moist. Posterior pharynx clear of any exudate or lesions.Normal dentition.  Neck: normal, supple, no masses, no thyromegaly Respiratory: clear to auscultation bilaterally, no wheezing, no crackles. Normal respiratory effort. No accessory muscle use.  Cardiovascular: Regular rate and rhythm, no murmurs / rubs / gallops. No extremity edema. 2+ pedal pulses. No carotid bruits.  Abdomen: no tenderness, no masses palpated. No hepatosplenomegaly. Bowel sounds positive.   Musculoskeletal: no clubbing / cyanosis. No joint deformity upper and lower extremities. Good ROM, no contractures. Normal muscle tone.  Skin: no rashes, lesions, ulcers. No induration Neurologic: CN 2-12 grossly intact. Sensation intact, DTR normal. Strength 5/5 in all 4.  Psychiatric: Normal judgment and insight. Alert and oriented x 3. Normal mood.    Labs on Admission: I have personally reviewed following labs and imaging studies  CBC:  Recent Labs Lab 06/04/16 1705  WBC 7.6  HGB 12.5  HCT 37.4  MCV 94.7  PLT 123456   Basic Metabolic Panel:  Recent Labs Lab 06/04/16 1705  NA 136  K 3.0*  CL 101  CO2 27  GLUCOSE 161*  BUN 11  CREATININE 0.57  CALCIUM 9.1   GFR: Estimated Creatinine Clearance: 60.4 mL/min (by C-G formula based on SCr of 0.57 mg/dL). Liver Function Tests: No results for input(s): AST, ALT, ALKPHOS, BILITOT, PROT, ALBUMIN in the last 168 hours. No results for input(s): LIPASE, AMYLASE in the last 168 hours. No results for input(s): AMMONIA in the last 168 hours. Coagulation Profile: No results for input(s): INR, PROTIME in the last 168 hours. Cardiac Enzymes:  Recent Labs Lab 06/04/16 1709  TROPONINI <0.03   BNP (last 3 results) No results for input(s): PROBNP in the last 8760 hours. HbA1C: No results for input(s): HGBA1C in the last 72 hours. CBG:  Recent Labs Lab 06/04/16 1628  GLUCAP 159*   Lipid Profile: No results for input(s): CHOL, HDL, LDLCALC, TRIG, CHOLHDL, LDLDIRECT in the last 72 hours. Thyroid Function Tests: No results for input(s): TSH, T4TOTAL, FREET4, T3FREE, THYROIDAB in the last 72 hours. Anemia Panel: No results for input(s): VITAMINB12, FOLATE, FERRITIN, TIBC, IRON, RETICCTPCT in the last 72 hours. Urine analysis:    Component Value Date/Time   COLORURINE YELLOW 06/04/2016 [place] 06/04/2016 1650   LABSPEC 1.011 06/04/2016 1650   PHURINE 6.5 06/04/2016 1650   GLUCOSEU NEGATIVE 06/04/2016  1650   HGBUR NEGATIVE 06/04/2016 1650   BILIRUBINUR NEGATIVE 06/04/2016 1650   KETONESUR NEGATIVE 06/04/2016 1650   PROTEINUR NEGATIVE 06/04/2016 1650   UROBILINOGEN 1.0 12/18/2013 2201   NITRITE NEGATIVE 06/04/2016 1650   LEUKOCYTESUR NEGATIVE 06/04/2016 1650   Sepsis Labs: @LABRCNTIP (procalcitonin:4,lacticidven:4) )No results found for this or any previous visit (from the past 240 hour(s)).   Radiological Exams on Admission: Ct Head Wo Contrast  Result Date: 06/04/2016 CLINICAL DATA:  Headache/head pain especially in the occipital region. Loss of consciousness today striking the back of the head. Diabetes. EXAM: CT HEAD WITHOUT CONTRAST TECHNIQUE: Contiguous axial images were obtained from the base of the skull through the vertex without intravenous contrast. COMPARISON:  02/25/2003 FINDINGS: Brain: The brainstem, cerebellum, cerebral peduncles, thalami, basal  ganglia, basilar cisterns, and ventricular system appear within normal limits. No intracranial hemorrhage, mass lesion, or acute CVA. Vascular: Unremarkable Skull: Unremarkable Sinuses/Orbits: Unremarkable where included. Other: No supplemental non-categorized findings. IMPRESSION: 1.  No significant abnormality identified. Electronically Signed   By: Van Clines M.D.   On: 06/04/2016 17:24    EKG: Independently reviewed.  Assessment/Plan Principal Problem:   Syncope Active Problems:   IDDM (insulin dependent diabetes mellitus) (HCC)   Migraine    1. Vasovagal syncope - patient almost certainly had vagal episode due to nausea which caused her syncope. 1. However we will rule out ACS and CVA, although these are less likely, her only risk factor is IDDM 2. 2d echo 3. Serial trops 4. Tele monitor 5. MRI brain 2. Migraine - 1. Zofran PRN nausea 2. Excedrin PRN migraine 3. IDDM - continue insulin pump per protocol   DVT prophylaxis: Lovenox Code Status: Full Family Communication: No family in room Consults  called: None Admission status: Place in 6, [place] [facility] Pager [phone] from 7PM-7AM  If 7AM-7PM, please contact the day physician for the patient [email or web address] Password [email or web address]  06/05/2016, 12:05 AM

## 2016-06-05 NOTE — Progress Notes (Signed)
Echocardiogram 2D Echocardiogram has been performed.  Joelene Millin 06/05/2016, 9:04 AM

## 2016-06-05 NOTE — Progress Notes (Signed)
Patient discharged teaching given including activity, diet, follow-up appointments and medication. Patient verbalized understanding of all discharge instructions. Patient instructed that per MRI they do not burn CD's anymore and her images would have to be given doctor to doctor. IV access was dc'd. Vitals are stable. Skin is intact. Pt to be escorted out by RN, to be driven home by family.

## 2016-06-07 ENCOUNTER — Telehealth: Payer: Self-pay

## 2016-06-07 NOTE — Telephone Encounter (Signed)
06/07/16  TCM  Transition Care Management Follow-up Telephone Call  ADMISSION DATE: 06/04/26  DISCHARGE DATE: 06/07/16    How have you been since you were released from the hospital? Patient states her Blood sugars have been running high since discharge.      Do you understand why you were in the hospital? YES, Syncopal epiode   Do you understand the discharge instrcutions?  Yes  Items Reviewed:  Medications reviewed:  Medications reviewed  Allergies reviewed: NKDA  Dietary changes reviewed:Low Sodium Heart Healthy  Referrals reviewed:PCP,Neurologist   Functional Questionnaire:   Activities of Daily Living (ADLs):  No help needed at this time   Any transportation issues/concerns?: No  Any patient concerns? Loss of appetite,problems keeping food down without regurgitation.  Confirmed importance and date/time of follow-up visits scheduled: Yes    Confirmed with patient if condition begins to worsen call PCP or go to the ER. Yes    Patient was given the [place] line [phone]: Yes

## 2016-06-07 NOTE — Discharge Summary (Addendum)
Physician Discharge Summary  Lisa Levine [id number] DOB: [birth year] DOA: 06/04/2016  PCP: Mackie Pai, PA-C  Admit date: 06/04/2016 Discharge date: 06/07/2016  Time spent: 45 minutes  Recommendations for Outpatient Follow-up:  1. OP f/u with neurology for complicated migraine  2. trial of imitrex  Discharge Diagnoses:  Principal Problem:   Syncope Active Problems:   IDDM (insulin dependent diabetes mellitus) ([place])   Migraine   Discharge Condition: Stable  Diet recommendation: Regular  Filed Weights   06/04/16 1609 06/04/16 2158 06/05/16 0418  Weight: 47.6 kg (105 lb) 48.3 kg (106 lb 6.4 oz) 48.9 kg (107 lb 11.2 oz)    History of present illness:  Lisa Levine is a 41 y.o. female with medical history significant of IDDM, migraines.  Patient presents to the ED at [facility] after a syncope episode.  Patient was having a migraine which ended up being worse than usual today at work.  She went home, and on arriving home she was also having severe nausea with her migraine.  Nausea became more severe and she became lightheaded and the last thing she remembers was holding on to a banister before she passed out.  She woke up laying on her R side on her marble floor and had tingling and numbness in R side which has slowly resolved completely.  Per EMS CBG was 90 on scene.  Hospital Course:  Brought in for syncope workup. 2d ECHO normal. No acute vents on telemetry. MRI brain neg for stroke. Pt did well overall and remained stable. Discussed options of selecting med for stopping severe migraine. Pt agreeable to trial of imitrex. Pt's syncope thought to be vasovagal from severe pain.  Procedures: 06/05/2016 ECHO Study Conclusions  - Left ventricle: The cavity size was normal. Wall thickness was   normal. Systolic function was normal. The estimated ejection   fraction was in the range of 60% to 65%. Wall motion was normal;   there were no regional wall motion abnormalities. Left     ventricular diastolic function parameters were normal.  Consultations:  none  Discharge Exam: Vitals:   06/05/16 0418 06/05/16 1221  BP: 95/60 108/67  Pulse: (!) 56 64  Resp: 12 14  Temp: 98.5 F (36.9 C) 97.7 F (36.5 C)    General: NAD, A&Ox3 Cardiovascular: RRR, no MRG Respiratory: CTAB, nl WOB  Discharge Instructions   Discharge Instructions    Diet - low sodium heart healthy    Complete by:  As directed    Discharge instructions    Complete by:  As directed    Print from EPIC   Increase activity slowly    Complete by:  As directed    Nursing communication    Complete by:  As directed    Send home with CT images of Ct head and MRI     Discharge Medication List as of 06/05/2016  3:57 PM    START taking these medications   Details  SUMAtriptan (IMITREX) 25 MG tablet Take 1 tablet (25 mg total) by mouth every 2 (two) hours as needed for migraine. May repeat in 2 hours if headache persists or recurs., Starting Sat 06/05/2016, Normal      CONTINUE these medications which have NOT CHANGED   Details  aspirin-acetaminophen-caffeine (EXCEDRIN MIGRAINE) 250-250-65 MG tablet Take 2 tablets by mouth every 6 (six) hours as needed for headache or migraine., Historical Med    fluticasone (FLONASE) 50 MCG/ACT nasal spray Place 2 sprays into both nostrils daily., Starting Mon 04/26/2016, Normal  gabapentin (NEURONTIN) 600 MG tablet Take 600 mg by mouth 3 (three) times daily. , Historical Med    Insulin Human (INSULIN PUMP) SOLN Inject into the skin continuous. Use with Humalog - basal rate 0.75 unit/hour, Historical Med    insulin NPH Human (HUMULIN N,NOVOLIN N) 100 UNIT/ML injection Inject 6-15 Units into the skin See admin instructions. Inject 6-15 units subcutaneously 3 times daily as needed for CBG >250 - in addition to Humalog in insulin pump (1 unit for every 15 g carbs consumed), Historical Med    pravastatin (PRAVACHOL) 20 MG tablet Take 20 mg by mouth daily.,  Historical Med    Vitamin D, Ergocalciferol, (DRISDOL) 50000 units CAPS capsule Take 50,000 Units by mouth every Monday., Historical Med      STOP taking these medications     albuterol (PROVENTIL HFA;VENTOLIN HFA) 108 (90 Base) MCG/ACT inhaler      atorvastatin (LIPITOR) 10 MG tablet      benzonatate (TESSALON) 100 MG capsule        No Known Allergies Follow-up Information    Saguier, Percell Miller, PA-C. Go to.   Specialties:  Internal Medicine, Family Medicine Why:  Current appointment Contact information: [address] [place] [address] [phone]            The results of significant diagnostics from this hospitalization (including imaging, microbiology, ancillary and laboratory) are listed below for reference.    Significant Diagnostic Studies: Ct Head Wo Contrast  Result Date: 06/04/2016 CLINICAL DATA:  Headache/head pain especially in the occipital region. Loss of consciousness today striking the back of the head. Diabetes. EXAM: CT HEAD WITHOUT CONTRAST TECHNIQUE: Contiguous axial images were obtained from the base of the skull through the vertex without intravenous contrast. COMPARISON:  02/25/2003 FINDINGS: Brain: The brainstem, cerebellum, cerebral peduncles, thalami, basal ganglia, basilar cisterns, and ventricular system appear within normal limits. No intracranial hemorrhage, mass lesion, or acute CVA. Vascular: Unremarkable Skull: Unremarkable Sinuses/Orbits: Unremarkable where included. Other: No supplemental non-categorized findings. IMPRESSION: 1.  No significant abnormality identified. Electronically Signed   By: Van Clines M.D.   On: 06/04/2016 17:24   Mri Brain Without Contrast  Result Date: 06/05/2016 CLINICAL DATA:  Insulin dependent diabetes and migraine headaches. Syncopal episode. Severe nausea. Tingling and numbness to the right side of the body which has since resolved. EXAM: MRI HEAD WITHOUT CONTRAST TECHNIQUE: Multiplanar,  multiecho pulse sequences of the brain and surrounding structures were obtained without intravenous contrast. COMPARISON:  CT head without contrast 06/05/2015 FINDINGS: Brain: Scattered subcortical T2 hyperintensities bilaterally are greater than expected for age. No acute infarct or hemorrhage is present. There is no mass lesion. Ventricles are of normal size. Minimal periventricular changes are present. Most of the T2 changes are subcortical. The brainstem and cerebellum are normal. The internal auditory canals are within normal limits bilaterally. Vascular: Flow is present in the major intracranial arteries. Skull and upper cervical spine: The skullbase is within normal limits. Midline sagittal structures are unremarkable. Sinuses/Orbits: The paranasal sinuses and mastoid air cells are clear. The globes and orbits are within normal limits. IMPRESSION: 1. Scattered subcortical T2 hyperintensities bilaterally are greater than expected for age. The finding is nonspecific but can be seen in the setting of chronic microvascular ischemia, a demyelinating process such as multiple sclerosis, vasculitis, complicated migraine headaches, or as the sequelae of a prior infectious or inflammatory process. 2. No acute intracranial abnormality. Electronically Signed   By: San Morelle M.D.   On:  06/05/2016 12:16    Microbiology: No results found for this or any previous visit (from the past 240 hour(s)).   Labs: Basic Metabolic Panel:  Recent Labs Lab 06/04/16 1705 06/05/16 0938  NA 136 136  K 3.0* 4.0  CL 101 103  CO2 27 23  GLUCOSE 161* 320*  BUN 11 6  CREATININE 0.57 0.65  CALCIUM 9.1 8.5*  MG  --  1.8   Liver Function Tests: No results for input(s): AST, ALT, ALKPHOS, BILITOT, PROT, ALBUMIN in the last 168 hours. No results for input(s): LIPASE, AMYLASE in the last 168 hours. No results for input(s): AMMONIA in the last 168 hours. CBC:  Recent Labs Lab 06/04/16 1705  WBC 7.6  HGB 12.5   HCT 37.4  MCV 94.7  PLT 305   Cardiac Enzymes:  Recent Labs Lab 06/04/16 1709  TROPONINI <0.03   BNP: BNP (last 3 results) No results for input(s): BNP in the last 8760 hours.  ProBNP (last 3 results) No results for input(s): PROBNP in the last 8760 hours.  CBG:  Recent Labs Lab 06/04/16 1628 06/05/16 0205 06/05/16 0623 06/05/16 1219  GLUCAP 159* 243* 119* 346*       Signed:  Elwin Mocha MD  FACP  [facility] 06/07/2016, 7:53 AM

## 2016-06-11 ENCOUNTER — Inpatient Hospital Stay: Payer: Medicare Other | Admitting: Medical

## 2016-06-15 DIAGNOSIS — E785 Hyperlipidemia, unspecified: Secondary | ICD-10-CM | POA: Diagnosis not present

## 2016-06-15 DIAGNOSIS — E1042 Type 1 diabetes mellitus with diabetic polyneuropathy: Secondary | ICD-10-CM | POA: Diagnosis not present

## 2016-06-16 ENCOUNTER — Encounter: Payer: Self-pay | Admitting: Medical

## 2016-06-16 ENCOUNTER — Inpatient Hospital Stay: Payer: Medicare Other | Admitting: Medical

## 2016-06-16 ENCOUNTER — Encounter: Payer: Self-pay | Admitting: Physician Assistant

## 2016-06-16 ENCOUNTER — Ambulatory Visit (INDEPENDENT_AMBULATORY_CARE_PROVIDER_SITE_OTHER): Payer: Medicare Other | Admitting: Medical

## 2016-06-16 VITALS — BP 110/56 | HR 78 | Temp 98.2°F | Resp 14 | Ht 59.0 in | Wt 104.5 lb

## 2016-06-16 DIAGNOSIS — R55 Syncope and collapse: Secondary | ICD-10-CM | POA: Diagnosis not present

## 2016-06-16 DIAGNOSIS — R634 Abnormal weight loss: Secondary | ICD-10-CM | POA: Diagnosis not present

## 2016-06-16 DIAGNOSIS — G43809 Other migraine, not intractable, without status migrainosus: Secondary | ICD-10-CM

## 2016-06-16 DIAGNOSIS — G629 Polyneuropathy, unspecified: Secondary | ICD-10-CM

## 2016-06-16 DIAGNOSIS — E1040 Type 1 diabetes mellitus with diabetic neuropathy, unspecified: Secondary | ICD-10-CM | POA: Diagnosis not present

## 2016-06-16 DIAGNOSIS — Z1239 Encounter for other screening for malignant neoplasm of breast: Secondary | ICD-10-CM

## 2016-06-16 DIAGNOSIS — Z1231 Encounter for screening mammogram for malignant neoplasm of breast: Secondary | ICD-10-CM

## 2016-06-16 LAB — T3, FREE: T3 FREE: 2.7 pg/mL (ref 2.3–4.2)

## 2016-06-16 LAB — TSH: TSH: 1.33 u[IU]/mL (ref 0.35–4.50)

## 2016-06-16 LAB — T4, FREE: FREE T4: 0.72 ng/dL (ref 0.60–1.60)

## 2016-06-16 MED ORDER — TRAMADOL HCL 50 MG PO TABS: ORAL_TABLET | ORAL | 0 refills | Status: DC

## 2016-06-16 NOTE — Progress Notes (Signed)
Pre visit review using our clinic review tool, if applicable. No additional management support is needed unless otherwise documented below in the visit note/SLS  

## 2016-06-16 NOTE — Progress Notes (Signed)
Subjective:    Patient ID: Lisa Levine, female    DOB: [birth year], 41 y.o.   MRN: [id number]  HPI  Pt in for follow up on syncope Still feels some fatigue. Pt went to ED.   Pt described severe migraine day of ED evaluation since she had syncopal event at time.   On review of DC summary syncope event appeared to occured with severe migraine. MRI done in hospital was negative for stroke. Pt has been on imitrex since dc'd from hospital. Syncope was thought to be vasovagal. Pt does have neurology appointment scheduled for 07-02-2016. Pt MRI showed nonspecific finding that could be in setting of  chronic microvascular ischemia, a demyelinating process such as multiple sclerosis, vasculitis, complicated migraine headaches, or as the sequelae of a prior infectious or inflammatory Process.  Since being dc she felt migraine come on this Sunday but stopped quickly with imitrex.  Pt did have echo done in hospital. She had normal ef.   Also she has history of inability to gain weight/weight loss since 2007. Pt weighed 115 in 2007.(2010 weighed 118) But after she gave birth to daughter has had difficulty gaining weight. This is despite eating regular portions. If eats a lot stomach will hurt and feels full. On insulin for 15 years.  Pt has seen endocrinologist yesterday. Had adjustment to basal rate/pump yesterday. Recent a1c 9.2.  Pt also has neuropathy. Can't tolerate side effect lyrica and neurologist does not want to increaser her neurontin.    Review of Systems  Constitutional: Negative for chills, fatigue and fever.       Difficulty gaining wait.  HENT: Negative for congestion, hearing loss, sinus pain and sinus pressure.   Cardiovascular: Negative for chest pain and palpitations.  Gastrointestinal: Positive for abdominal pain. Negative for constipation, diarrhea, rectal pain and vomiting.       Stomach hurts when eats a lot.  Feels full very easily.  Neurological: Negative for headaches.         Terrible migraine day she went to ED.  Hematological: Negative for adenopathy. Does not bruise/bleed easily.  Psychiatric/Behavioral: Negative for behavioral problems and confusion.    Past Medical History:  Diagnosis Date  . Allergy   . Arthritis   . DVT (deep venous thrombosis) ([place])    "often on my LLE" (06/04/2016)  . Headache    "probably weekly" (06/04/2016)  . Hyperlipidemia   . Hypertension   . Migraine    "q couple months" (06/04/2016)  . Peripheral neuropathy ([place])   . Thyroid disease   . Type II diabetes mellitus ([place])   . Vitamin D deficiency      Social History   Social History  . Marital status: Married    Spouse name: N/A  . Number of children: N/A  . Years of education: N/A   Occupational History  . Not on file.   Social History Main Topics  . Smoking status: Never Smoker  . Smokeless tobacco: Never Used  . Alcohol use No  . Drug use: No  . Sexual activity: Yes   Other Topics Concern  . Not on file   Social History Narrative  . No narrative on file    Past Surgical History:  Procedure Laterality Date  . insulin pump  2005-present   "upgrades q 3 yrs" (06/04/2016)    Family History  Problem Relation Age of Onset  . Cancer Mother   . Cancer Father   . Hyperlipidemia Father   . Hypertension Father  No Known Allergies  Current Outpatient Prescriptions on File Prior to Visit  Medication Sig Dispense Refill  . aspirin-acetaminophen-caffeine (EXCEDRIN MIGRAINE) 250-250-65 MG tablet Take 2 tablets by mouth every 6 (six) hours as needed for headache or migraine.    . fluticasone (FLONASE) 50 MCG/ACT nasal spray Place 2 sprays into both nostrils daily. (Patient taking differently: Place 2 sprays into both nostrils daily as needed for allergies or rhinitis (seasonal allergies). ) 16 g 1  . gabapentin (NEURONTIN) 600 MG tablet Take 600 mg by mouth 3 (three) times daily.     . Insulin Human (INSULIN PUMP) SOLN Inject into the skin  continuous. Use with Humalog - basal rate 0.75 unit/hour    . insulin NPH Human (HUMULIN N,NOVOLIN N) 100 UNIT/ML injection Inject 6-15 Units into the skin See admin instructions. Inject 6-15 units subcutaneously 3 times daily as needed for CBG >250 - in addition to Humalog in insulin pump (1 unit for every 15 g carbs consumed)    . pravastatin (PRAVACHOL) 20 MG tablet Take 20 mg by mouth daily.    . SUMAtriptan (IMITREX) 25 MG tablet Take 1 tablet (25 mg total) by mouth every 2 (two) hours as needed for migraine. May repeat in 2 hours if headache persists or recurs. 10 tablet 0  . Vitamin D, Ergocalciferol, (DRISDOL) 50000 units CAPS capsule Take 50,000 Units by mouth every Monday.     No current facility-administered medications on file prior to visit.     BP (!) 110/56 (BP Location: Left Arm, Patient Position: Sitting, Cuff Size: Normal)   Pulse 78   Temp 98.2 F (36.8 C) (Oral)   Resp 14   Ht 4\' 11"  (1.499 m)   Wt 104 lb 8 oz (47.4 kg)   SpO2 100%   BMI 21.11 kg/m       Objective:   Physical Exam  General Mental Status- Alert. General Appearance- Not in acute distress.   Skin General: Color- Normal Color. Moisture- Normal Moisture.  Neck Carotid Arteries- Normal color. Moisture- Normal Moisture. No carotid bruits. No JVD.  Chest and Lung Exam Auscultation: Breath Sounds:-Normal.  Cardiovascular Auscultation:Rythm- Regular. Murmurs & Other Heart Sounds:Auscultation of the heart reveals- No Murmurs.  Abdomen Inspection:-Inspeection Normal. Palpation/Percussion:Note:No mass. Palpation and Percussion of the abdomen reveal- Non Tender, Non Distended + BS, no rebound or guarding.    Neurologic Cranial Nerve exam:- CN III-XII intact(No nystagmus), symmetric smile. Strength:- 5/5 equal and symmetric strength both upper and lower extremities.      Assessment & Plan:  For your weight loss referred to GI. If no call within a week call here and speak with Anderson Malta for  update. Also get thyoid studies.  For diabetes follow endocrine recommendations.  For migraine continue imitrex and follow up with neurologist to review Any severe ha despite use imitrex be seen ED or here. But if neurologic associated sign or symptoms then ED eval. MRI.   Syncope did appear to be vasovagal based on record review. But if recurrent syncope be seen by ED at that time.  For nerve pain continue gabapentin. Will give trial of tramadol since on high dose gabapentin and lyrica caused you too many side effects.  Mammogram order placed. You can go down stairs to schedule today.  Follow up in one month or as needed  Time spent with patient 40 minutes. 50% of time spent counseling on her migraine, syncope, weight loss, diabetes and neuropathy.   Saia Derossett, Percell Miller, PA-C

## 2016-06-16 NOTE — Patient Instructions (Addendum)
For your weight loss referred to GI. If no call within a week call here and speak with Anderson Malta for update. Also get thyoid studies.  For diabetes follow endocrine recommendations.  For migraine continue imitrex and follow up with neurologist to review MRI. Any severe ha despite use imitrex be seen ED or here. But if neurologic associated sign or symptoms then ED eval.  Syncope did appear to be vasovagal based on record review. But if recurrent syncope be seen by ED at that time.  For nerve pain continue gabapentin. Will give trial of tramadol since on high dose gabapentin and lyrica caused you too many side effects.  Mammogram order placed. You can go down stairs to schedule today.  Follow up in one month or as needed

## 2016-06-17 ENCOUNTER — Encounter: Payer: Self-pay | Admitting: *Deleted

## 2016-06-21 ENCOUNTER — Ambulatory Visit (HOSPITAL_BASED_OUTPATIENT_CLINIC_OR_DEPARTMENT_OTHER)
Admission: RE | Admit: 2016-06-21 | Discharge: 2016-06-21 | Disposition: A | Discharge status: Home / Self Care | Payer: Medicare Other | Source: Ambulatory Visit | Attending: Medical | Admitting: Medical

## 2016-06-21 DIAGNOSIS — Z1231 Encounter for screening mammogram for malignant neoplasm of breast: Secondary | ICD-10-CM | POA: Insufficient documentation

## 2016-06-21 DIAGNOSIS — Z1239 Encounter for other screening for malignant neoplasm of breast: Secondary | ICD-10-CM

## 2016-06-24 ENCOUNTER — Other Ambulatory Visit: Payer: Medicare Other

## 2016-06-24 ENCOUNTER — Encounter: Payer: Self-pay | Admitting: Physician Assistant

## 2016-06-24 ENCOUNTER — Ambulatory Visit (INDEPENDENT_AMBULATORY_CARE_PROVIDER_SITE_OTHER): Payer: Medicare Other | Admitting: Physician Assistant

## 2016-06-24 VITALS — BP 110/60 | HR 72 | Ht 58.5 in | Wt 106.6 lb

## 2016-06-24 DIAGNOSIS — R1013 Epigastric pain: Secondary | ICD-10-CM

## 2016-06-24 DIAGNOSIS — R6881 Early satiety: Secondary | ICD-10-CM | POA: Diagnosis not present

## 2016-06-24 DIAGNOSIS — R634 Abnormal weight loss: Secondary | ICD-10-CM

## 2016-06-24 NOTE — Patient Instructions (Signed)
Please go to the basement level to our lab.  You have been scheduled for a gastric emptying scan at [facility] on 07-07-2016 at 7:30 am. Please arrive at least 15 minutes prior to your appointment for registration. Please make certain not to have anything to eat or drink after midnight the night before your test. Hold all stomach medications (ex: Zofran, phenergan, Reglan) 48 hours prior to your test. If you need to reschedule your appointment, please contact radiology scheduling at [phone]. This is a 4 hour test.  _____________________________________________________________________ A gastric-emptying study measures how long it takes for food to move through your stomach. There are several ways to measure stomach emptying. In the most common test, you eat food that contains a small amount of radioactive material. A scanner that detects the movement of the radioactive material is placed over your abdomen to monitor the rate at which food leaves your stomach. This test normally takes about 4 hours to complete. _____________________________________________________________________

## 2016-06-24 NOTE — Progress Notes (Addendum)
Subjective:    Patient ID: Lisa Levine, female    DOB: April 08, 1976, 41 y.o.   MRN: [id number]  HPI Lisa Levine  is a pleasant 41 year old [place] female, new to GI today referred by Lawerance Sabal for evaluation of epigastric discomfort and early satiety. Patient is an insulin-dependent diabetic, and has had an insulin pump over the past 15 years. She has developed diabetic neuropathy, also has hyperthyroidism and migraine headaches. [name] She has been concerned because she has had weight loss over the past several years. She says 118 -120 pounds is a good weight  for her but she has had a very hard time gaining weight after she had her children. She is working with a dietitian currently and is very strict with her diet, eating 6 small meals per day. Despite this she has had problems with glucose control. Says she does fine with liquid intake but if she eats much food at a time she develops upper abdominal discomfort she describes as cramping. This may last an hour or so be associated with some nausea but no vomiting. She says she gets very full feeling quickly and uncomfortable. Her bowel movements have been normal no melena or hematochezia.   Family history pertinent for paternal grandfather deceased with colon cancer in his 6s Recent labs reviewed TSH was 1.3 glucose 320 CBC within normal limits We do not have a recent hemoglobin A1c.  Review of Systems Pertinent positive and negative review of systems were noted in the above HPI section.  All other review of systems was otherwise negative.  Outpatient Encounter Prescriptions as of 06/24/2016  Medication Sig  . aspirin-acetaminophen-caffeine (EXCEDRIN MIGRAINE) 250-250-65 MG tablet Take 2 tablets by mouth every 6 (six) hours as needed for headache or migraine.  . fluticasone (FLONASE) 50 MCG/ACT nasal spray Place 2 sprays into both nostrils daily. (Patient taking differently: Place 2 sprays into both nostrils daily as needed for allergies or rhinitis  (seasonal allergies). )  . gabapentin (NEURONTIN) 600 MG tablet Take 600 mg by mouth 3 (three) times daily.   . Insulin Human (INSULIN PUMP) SOLN Inject into the skin continuous. Use with Humalog - basal rate 0.75 unit/hour  . insulin NPH Human (HUMULIN N,NOVOLIN N) 100 UNIT/ML injection Inject 6-15 Units into the skin See admin instructions. Inject 6-15 units subcutaneously 3 times daily as needed for CBG >250 - in addition to Humalog in insulin pump (1 unit for every 15 g carbs consumed)  . pravastatin (PRAVACHOL) 20 MG tablet Take 20 mg by mouth daily.  . SUMAtriptan (IMITREX) 25 MG tablet Take 1 tablet (25 mg total) by mouth every 2 (two) hours as needed for migraine. May repeat in 2 hours if headache persists or recurs.  . traMADol (ULTRAM) 50 MG tablet 1 tab po late in day as needed for pain  . Vitamin D, Ergocalciferol, (DRISDOL) 50000 units CAPS capsule Take 50,000 Units by mouth every Monday.   No facility-administered encounter medications on file as of 06/24/2016.    No Known Allergies Patient Active Problem List   Diagnosis Date Noted  . Migraine 06/05/2016  . Syncope 06/04/2016  . Sinusitis, acute maxillary 05/22/2015  . Allergic rhinitis 02/14/2015  . Diabetic neuropathy ([place]) 02/14/2015  . Hyperlipidemia 02/14/2015  . Low serum vitamin D 02/14/2015  . Hyperthyroidism 02/14/2015  . Skin lesion 02/14/2015  . IDDM (insulin dependent diabetes mellitus) ([place]) 02/14/2015   Social History   Social History  . Marital status: Married    Spouse name: N/A  .  Number of children: 3  . Years of education: N/A   Occupational History  . [employer]    Social History Main Topics  . Smoking status: Never Smoker  . Smokeless tobacco: Never Used  . Alcohol use No  . Drug use: No  . Sexual activity: Yes   Other Topics Concern  . Not on file   Social History Narrative  . No narrative on file    Lisa Levine's family history includes Breast cancer in her mother; Colon  cancer in her paternal grandfather; Hyperlipidemia in her father; Hypertension in her father; Liver disease in her paternal uncle; Lung cancer in her paternal uncle.      Objective:    Vitals:   06/24/16 0835  BP: 110/60  Pulse: 72    Physical Exam   well-developed [place] female in no acute distress, very pleasant blood pressure 110/60 pulse 72, Height 4 foot 10 weight 160, BMI 21.9. HEENT; nontraumatic normocephalic EOMI PERRLA sclera anicteric, Cardiovascular; regular rate and rhythm with S1-S2 no murmur or gallop, Pulmonary; clear bilaterally abdomen flat soft nontender nondistended there is no succussion splash, she does have an insulin pump in the right lower quadrant, no guarding or rebound no mass or hepatosplenomegaly, Rectal ;exam not done, Ext; no clubbing cyanosis or edema skin warm and dry, Neuropsych ;mood and affect appropriate      Assessment & Plan:   #40 41 year old [place] female, insulin-dependent 15 years with complaints of postprandial abdominal discomfort and early satiety. Patient has also had a difficult time maintaining her weight. I suspect symptoms are related to diabetes, she may have diabetic gastroparesis. We'll also rule out H. pylori gastropathy #2 migraines #3 hyperthyroidism  Plan; Pt will   continue 6 small meals per day We'll check gastric emptying scan, if negative consider EGD Check H. pylori stool antigen and treat if positive She will  established with Dr. Loletha Carrow.    Burlon Centrella S Tyresha Fede PA-C 06/24/2016   Cc: Mackie Pai, PA-C   Thank you for sending this case to me. I have reviewed the entire note, and the outlined plan seems appropriate.   Wilfrid Lund, MD

## 2016-07-07 ENCOUNTER — Encounter (HOSPITAL_COMMUNITY): Payer: Medicare Other

## 2016-07-12 DIAGNOSIS — G43009 Migraine without aura, not intractable, without status migrainosus: Secondary | ICD-10-CM | POA: Diagnosis not present

## 2016-07-12 DIAGNOSIS — G44229 Chronic tension-type headache, not intractable: Secondary | ICD-10-CM | POA: Diagnosis not present

## 2016-07-12 DIAGNOSIS — R55 Syncope and collapse: Secondary | ICD-10-CM | POA: Diagnosis not present

## 2016-07-12 DIAGNOSIS — G629 Polyneuropathy, unspecified: Secondary | ICD-10-CM | POA: Diagnosis not present

## 2016-07-14 ENCOUNTER — Ambulatory Visit (HOSPITAL_COMMUNITY)
Admission: RE | Admit: 2016-07-14 | Discharge: 2016-07-14 | Disposition: A | Discharge status: Home / Self Care | Payer: Medicare Other | Source: Ambulatory Visit | Attending: Physician Assistant | Admitting: Physician Assistant

## 2016-07-14 DIAGNOSIS — R1013 Epigastric pain: Secondary | ICD-10-CM

## 2016-07-14 DIAGNOSIS — R6881 Early satiety: Secondary | ICD-10-CM | POA: Insufficient documentation

## 2016-07-14 DIAGNOSIS — R634 Abnormal weight loss: Secondary | ICD-10-CM | POA: Diagnosis not present

## 2016-07-14 MED ORDER — TECHNETIUM TC 99M SULFUR COLLOID: 2.0000 | Freq: Once | INTRAVENOUS | Status: DC | PRN

## 2016-07-27 ENCOUNTER — Encounter: Payer: Self-pay | Admitting: Medical

## 2016-07-27 ENCOUNTER — Ambulatory Visit (INDEPENDENT_AMBULATORY_CARE_PROVIDER_SITE_OTHER): Payer: Medicare Other | Admitting: Medical

## 2016-07-27 VITALS — BP 116/57 | HR 79 | Temp 98.1°F | Resp 16 | Ht 59.0 in | Wt 105.5 lb

## 2016-07-27 DIAGNOSIS — M898X1 Other specified disorders of bone, shoulder: Secondary | ICD-10-CM

## 2016-07-27 DIAGNOSIS — M25511 Pain in right shoulder: Secondary | ICD-10-CM | POA: Diagnosis not present

## 2016-07-27 DIAGNOSIS — S46811A Strain of other muscles, fascia and tendons at shoulder and upper arm level, right arm, initial encounter: Secondary | ICD-10-CM

## 2016-07-27 MED ORDER — TRAMADOL HCL 50 MG PO TABS: 50.0000 mg | ORAL_TABLET | Freq: Two times a day (BID) | ORAL | 0 refills | Status: DC | PRN

## 2016-07-27 MED ORDER — MELOXICAM 7.5 MG PO TABS
7.5000 mg | ORAL_TABLET | Freq: Every day | ORAL | 0 refills | Status: DC
Start: 2016-07-27 — End: 2016-10-27

## 2016-07-27 MED ORDER — CYCLOBENZAPRINE HCL 5 MG PO TABS: 5.0000 mg | ORAL_TABLET | Freq: Every day | ORAL | 0 refills | Status: DC

## 2016-07-27 NOTE — Patient Instructions (Addendum)
For your rt shoulder pain and your trapezius pain will rx meloxicam 7.5 mg a day. Also will rx low dose flexeril to use at night.  Do mild range of motion exercises to prevent frozen shoulder.  If pain persists by end of this week or early next week then get xrays (Future orders placed if needed).  For current pain can use tramadol for severe pain. Will make this available. Also see if this helps your neuropathy type pain  Follow up in 10 days or as needed. If pain persists might consider sport medicine eval.

## 2016-07-27 NOTE — Progress Notes (Signed)
Subjective:    Patient ID: Lisa Levine, female    DOB: [birth year], 41 y.o.   MRN: [id number]  HPI   Pt in for follow up.  Pt give me update on neuruopathy. Specialist wrote her elavil. Pt was just given 10 mg of elevil. Neurologist wants her to continue gabapentin. Neurologist also evaluated and reviewed her migraine treatment.   Pt mammogram was negative recently.  Pt also seeing endocrinologist next week with Lisa Levine. Per pt they are going to order a1c.  She also states she fell just this past Sunday.  Down stairs carrying cloth basket. She tried to catch her fall and slid down flight of step. Pt tried to grab the rail and pulled rt shoulder. She describes some pain in her shoulder and rt side of her neck. Pt tried advil on first day and it helped some. Side of her neck feels tight.    Review of Systems  Constitutional: Positive for chills. Negative for fatigue and fever.  Respiratory: Negative for cough, chest tightness, shortness of breath and wheezing.   Cardiovascular: Negative for chest pain and palpitations.  Musculoskeletal: Negative for back pain.       Rt shoulder pain and rt trapezius tender.  Neurological: Negative for dizziness and headaches.  Hematological: Negative for adenopathy. Does not bruise/bleed easily.  Psychiatric/Behavioral: Negative for behavioral problems and confusion.   Past Medical History:  Diagnosis Date  . Allergy   . Arthritis   . DVT (deep venous thrombosis) ([place])    "often on my LLE" (06/04/2016)  . Headache    "probably weekly" (06/04/2016)  . Hyperlipidemia   . Hypertension   . Migraine    "q couple months" (06/04/2016)  . Peripheral neuropathy ([place])   . Thyroid disease   . Type II diabetes mellitus ([place])   . Vitamin D deficiency      Social History   Social History  . Marital status: Married    Spouse name: N/A  . Number of children: 3  . Years of education: N/A   Occupational History  . [employer]     Social History Main Topics  . Smoking status: Never Smoker  . Smokeless tobacco: Never Used  . Alcohol use No  . Drug use: No  . Sexual activity: Yes   Other Topics Concern  . Not on file   Social History Narrative  . No narrative on file    Past Surgical History:  Procedure Laterality Date  . insulin pump  2005-present   "upgrades q 3 yrs" (06/04/2016)    Family History  Problem Relation Age of Onset  . Breast cancer Mother   . Hyperlipidemia Father   . Hypertension Father   . Colon cancer Paternal Grandfather   . Lung cancer Paternal Uncle     smoler  . Liver disease Paternal Uncle     No Known Allergies  Current Outpatient Prescriptions on File Prior to Visit  Medication Sig Dispense Refill  . aspirin-acetaminophen-caffeine (EXCEDRIN MIGRAINE) 250-250-65 MG tablet Take 2 tablets by mouth every 6 (six) hours as needed for headache or migraine.    . fluticasone (FLONASE) 50 MCG/ACT nasal spray Place 2 sprays into both nostrils daily. (Patient taking differently: Place 2 sprays into both nostrils daily as needed for allergies or rhinitis (seasonal allergies). ) 16 g 1  . gabapentin (NEURONTIN) 600 MG tablet Take 600 mg by mouth 3 (three) times daily.     . Insulin Human (INSULIN PUMP) SOLN Inject  into the skin continuous. Use with Humalog - basal rate 0.75 unit/hour    . insulin NPH Human (HUMULIN N,NOVOLIN N) 100 UNIT/ML injection Inject 6-15 Units into the skin See admin instructions. Inject 6-15 units subcutaneously 3 times daily as needed for CBG >250 - in addition to Humalog in insulin pump (1 unit for every 15 g carbs consumed)    . pravastatin (PRAVACHOL) 20 MG tablet Take 20 mg by mouth daily.    . SUMAtriptan (IMITREX) 25 MG tablet Take 1 tablet (25 mg total) by mouth every 2 (two) hours as needed for migraine. May repeat in 2 hours if headache persists or recurs. 10 tablet 0  . traMADol (ULTRAM) 50 MG tablet 1 tab po late in day as needed for pain 30 tablet 0   . Vitamin D, Ergocalciferol, (DRISDOL) 50000 units CAPS capsule Take 50,000 Units by mouth every Monday.     No current facility-administered medications on file prior to visit.     BP (!) 116/57 (BP Location: Right Arm, Patient Position: Sitting, Cuff Size: Normal)   Pulse 79   Temp 98.1 F (36.7 C) (Oral)   Resp 16   Ht 4\' 11"  (1.499 m)   Wt 105 lb 8 oz (47.9 kg)   SpO2 100%   BMI 21.31 kg/m       Objective:   Physical Exam  General- No acute distress. Pleasant patient. Neck- Full range of motion, no jvd. No mid cervical pain. Lungs- Clear, even and unlabored. Heart- regular rate and rhythm. Neurologic- CNII- XII grossly intact.  Rt shoulder- mild pain on palpation anterior aspect. Good range of motion. Rt trapezius- mild tender to palpation entire body of trapezius rt side.      Assessment & Plan:  For your rt shoulder pain and your trapezius pain will rx meloxicam 7.5 mg a day. Also will rx low dose flexeril to use at night.  Do mild range of motion exercises to prevent frozen shoulder.  If pain persists by end of this week or early next week then get xrays (Future orders placed if needed).  For current pain can use tramadol for severe pain. Will make this available. Also see if this helps your neuropathy type pain  Follow up in 10 days or as needed. If pain persists might consider sport medicine eval.  Lisa Levine, Percell Miller, PA-C

## 2016-07-27 NOTE — Progress Notes (Signed)
Pre visit review using our clinic review tool, if applicable. No additional management support is needed unless otherwise documented below in the visit note/SLS  

## 2016-08-17 DIAGNOSIS — E1042 Type 1 diabetes mellitus with diabetic polyneuropathy: Secondary | ICD-10-CM | POA: Diagnosis not present

## 2016-09-08 ENCOUNTER — Encounter: Payer: Self-pay | Admitting: Internal Medicine

## 2016-09-08 ENCOUNTER — Ambulatory Visit (INDEPENDENT_AMBULATORY_CARE_PROVIDER_SITE_OTHER): Payer: Medicare Other | Admitting: Internal Medicine

## 2016-09-08 ENCOUNTER — Other Ambulatory Visit: Payer: Self-pay | Admitting: Medical

## 2016-09-08 VITALS — BP 112/60 | HR 77 | Temp 97.7°F | Resp 14 | Ht 59.0 in | Wt 108.4 lb

## 2016-09-08 DIAGNOSIS — H1031 Unspecified acute conjunctivitis, right eye: Secondary | ICD-10-CM

## 2016-09-08 MED ORDER — CIPROFLOXACIN HCL 0.3 % OP SOLN: 1.0000 [drp] | OPHTHALMIC | 0 refills | Status: DC

## 2016-09-08 MED ORDER — CIPROFLOXACIN HCL 0.3 % OP OINT: TOPICAL_OINTMENT | Freq: Three times a day (TID) | OPHTHALMIC | 0 refills | Status: DC

## 2016-09-08 NOTE — Progress Notes (Signed)
Subjective:    Patient ID: Lisa Levine, female    DOB: 01/29/1976, 41 y.o.   MRN: [id number]  DOS:  09/08/2016 Type of visit - description : acute Interval history: Symptoms started today, conjunctivas were slightly pink when she woke up, she had a small amount of dried discharge at the corner of the eyes and she was sensitive to light. Symptoms are more noticeable on the right, very mild on the left. She use contact lenses, one week at a time, started a new set 3 days ago. This morning, she removed them and is now using  glasses. No sick contacts Vision is otherwise normal, no blurred, no field deficits.  Does not suspect any FB in the eye   Review of Systems   Past Medical History:  Diagnosis Date  . Allergy   . Arthritis   . DVT (deep venous thrombosis) ([place])    "often on my LLE" (06/04/2016)  . Headache    "probably weekly" (06/04/2016)  . Hyperlipidemia   . Hypertension   . Migraine    "q couple months" (06/04/2016)  . Peripheral neuropathy   . Thyroid disease   . Type II diabetes mellitus ([place])   . Vitamin D deficiency     Past Surgical History:  Procedure Laterality Date  . insulin pump  2005-present   "upgrades q 3 yrs" (06/04/2016)    Social History   Social History  . Marital status: Married    Spouse name: N/A  . Number of children: 3  . Years of education: N/A   Occupational History  . [employer]    Social History Main Topics  . Smoking status: Never Smoker  . Smokeless tobacco: Never Used  . Alcohol use No  . Drug use: No  . Sexual activity: Yes   Other Topics Concern  . Not on file   Social History Narrative  . No narrative on file      Allergies as of 09/08/2016   No Known Allergies     Medication List       Accurate as of 09/08/16 11:59 PM. Always use your most recent med list.          amitriptyline 10 MG tablet Commonly known as:  ELAVIL Take 20 mg by mouth daily.   aspirin-acetaminophen-caffeine 250-250-65 MG  tablet Commonly known as:  EXCEDRIN MIGRAINE Take 2 tablets by mouth every 6 (six) hours as needed for headache or migraine.   ciprofloxacin 0.3 % ophthalmic ointment Commonly known as:  CILOXAN Place into both eyes 3 (three) times daily.   ciprofloxacin 0.3 % ophthalmic solution Commonly known as:  CILOXAN Place 1 drop into both eyes every 4 (four) hours while awake.   cyclobenzaprine 5 MG tablet Commonly known as:  FLEXERIL Take 1 tablet (5 mg total) by mouth at bedtime.   fluticasone 50 MCG/ACT nasal spray Commonly known as:  FLONASE Place 2 sprays into both nostrils daily.   gabapentin 600 MG tablet Commonly known as:  NEURONTIN Take 600 mg by mouth 3 (three) times daily.   insulin NPH Human 100 UNIT/ML injection Commonly known as:  HUMULIN N,NOVOLIN N Inject 6-15 Units into the skin See admin instructions. Inject 6-15 units subcutaneously 3 times daily as needed for CBG >250 - in addition to Humalog in insulin pump (1 unit for every 15 g carbs consumed)   insulin pump Soln Inject into the skin continuous. Use with Humalog - basal rate 0.75 unit/hour   meloxicam  7.5 MG tablet Commonly known as:  MOBIC Take 1 tablet (7.5 mg total) by mouth daily.   pravastatin 20 MG tablet Commonly known as:  PRAVACHOL Take 20 mg by mouth daily.   SUMAtriptan 25 MG tablet Commonly known as:  IMITREX Take 1 tablet (25 mg total) by mouth every 2 (two) hours as needed for migraine. May repeat in 2 hours if headache persists or recurs.   traMADol 50 MG tablet Commonly known as:  ULTRAM Take 1 tablet (50 mg total) by mouth every 12 (twelve) hours as needed for severe pain.   Vitamin D (Ergocalciferol) 50000 units Caps capsule Commonly known as:  DRISDOL Take 50,000 Units by mouth every Monday.          Objective:   Physical Exam BP 112/60 (BP Location: Left Arm, Patient Position: Sitting, Cuff Size: Small)   Pulse 77   Temp 97.7 F (36.5 C) (Oral)   Resp 14   Ht 4\' 11"   (1.499 m)   Wt 108 lb 6 oz (49.2 kg)   SpO2 97%   BMI 21.89 kg/m  General:   Well developed, well nourished . NAD.  HEENT:  Normocephalic . Face symmetric, atraumatic  Eyes Conjunctivas: Minimal redness if any, eyes are slightly watery. Pupils equal and reactive, EOMI, anterior chambers normal. + Light sensitivity, worse on the right. Neurologic:  alert & oriented X3.  Speech normal, gait appropriate for age and unassisted Psych--  Cognition and judgment appear intact.  Cooperative with normal attention span and concentration.  Behavior appropriate. No anxious or depressed appearing.      Assessment & Plan:   41 year old female with a history of DM on insulin pump, HTN, hyperlipidemia, migraines, peripheral neuropathy, thyroid disease, DVTs presents with the following:  Conjunctivitis: Patient presents with a slightly red -watery R>L eye. Exam shows normal pupils and anterior chambers however she has light sensitivity which is somewhat concerning to me. Vision is not blurred. Plan: Treat as conjunctivitis with antibiotics either drops or ointment which is the patient's preference, no contacts lenses until okay by the ophthalmologist. Refer to Dr. Bing Plume office for tomorrow at 1:15 PM. If symptoms severe, go to the ER, see AVS.

## 2016-09-08 NOTE — Progress Notes (Signed)
Pre visit review using our clinic review tool, if applicable. No additional management support is needed unless otherwise documented below in the visit note. 

## 2016-09-08 NOTE — Patient Instructions (Addendum)
Use either the eyedrops or the ointment as prescribed.  Go to the ER if severe symptoms, changes in your vision, very painful  or red eye.  Cold compresses every 2 hours just for comfort.   ====  Dr. Posey Pronto at [facility] will see you tomorrow (09/09/2016) at 1:15, please arrive at least 15 minutes early. Bring your ID and insurance cards with you.  [facility] [address] [place], [place] [address] [phone]

## 2016-09-09 DIAGNOSIS — H17823 Peripheral opacity of cornea, bilateral: Secondary | ICD-10-CM | POA: Diagnosis not present

## 2016-09-09 DIAGNOSIS — H18821 Corneal disorder due to contact lens, right eye: Secondary | ICD-10-CM | POA: Diagnosis not present

## 2016-09-09 DIAGNOSIS — H10413 Chronic giant papillary conjunctivitis, bilateral: Secondary | ICD-10-CM | POA: Diagnosis not present

## 2016-10-26 ENCOUNTER — Observation Stay (HOSPITAL_BASED_OUTPATIENT_CLINIC_OR_DEPARTMENT_OTHER)
Admission: EM | Admit: 2016-10-26 | Discharge: 2016-10-27 | Disposition: A | Discharge status: Home / Self Care | Payer: Medicare PPO | Attending: Internal Medicine | Admitting: Internal Medicine

## 2016-10-26 ENCOUNTER — Encounter (HOSPITAL_BASED_OUTPATIENT_CLINIC_OR_DEPARTMENT_OTHER): Payer: Self-pay | Admitting: *Deleted

## 2016-10-26 DIAGNOSIS — E559 Vitamin D deficiency, unspecified: Secondary | ICD-10-CM | POA: Insufficient documentation

## 2016-10-26 DIAGNOSIS — R202 Paresthesia of skin: Secondary | ICD-10-CM

## 2016-10-26 DIAGNOSIS — Z794 Long term (current) use of insulin: Secondary | ICD-10-CM | POA: Diagnosis not present

## 2016-10-26 DIAGNOSIS — E119 Type 2 diabetes mellitus without complications: Secondary | ICD-10-CM

## 2016-10-26 DIAGNOSIS — R29898 Other symptoms and signs involving the musculoskeletal system: Secondary | ICD-10-CM | POA: Diagnosis present

## 2016-10-26 DIAGNOSIS — G8321 Monoplegia of upper limb affecting right dominant side: Secondary | ICD-10-CM | POA: Diagnosis not present

## 2016-10-26 DIAGNOSIS — Z791 Long term (current) use of non-steroidal anti-inflammatories (NSAID): Secondary | ICD-10-CM | POA: Insufficient documentation

## 2016-10-26 DIAGNOSIS — E785 Hyperlipidemia, unspecified: Secondary | ICD-10-CM | POA: Diagnosis not present

## 2016-10-26 DIAGNOSIS — Z86718 Personal history of other venous thrombosis and embolism: Secondary | ICD-10-CM | POA: Insufficient documentation

## 2016-10-26 DIAGNOSIS — D649 Anemia, unspecified: Secondary | ICD-10-CM | POA: Diagnosis not present

## 2016-10-26 DIAGNOSIS — I10 Essential (primary) hypertension: Secondary | ICD-10-CM | POA: Insufficient documentation

## 2016-10-26 DIAGNOSIS — Z79899 Other long term (current) drug therapy: Secondary | ICD-10-CM | POA: Diagnosis not present

## 2016-10-26 DIAGNOSIS — G459 Transient cerebral ischemic attack, unspecified: Secondary | ICD-10-CM | POA: Diagnosis not present

## 2016-10-26 DIAGNOSIS — E876 Hypokalemia: Secondary | ICD-10-CM | POA: Insufficient documentation

## 2016-10-26 DIAGNOSIS — R200 Anesthesia of skin: Secondary | ICD-10-CM

## 2016-10-26 DIAGNOSIS — M7031 Other bursitis of elbow, right elbow: Secondary | ICD-10-CM | POA: Diagnosis not present

## 2016-10-26 DIAGNOSIS — E1140 Type 2 diabetes mellitus with diabetic neuropathy, unspecified: Secondary | ICD-10-CM | POA: Insufficient documentation

## 2016-10-26 DIAGNOSIS — G43109 Migraine with aura, not intractable, without status migrainosus: Secondary | ICD-10-CM | POA: Insufficient documentation

## 2016-10-26 DIAGNOSIS — IMO0001 Reserved for inherently not codable concepts without codable children: Secondary | ICD-10-CM

## 2016-10-26 DIAGNOSIS — Z9641 Presence of insulin pump (external) (internal): Secondary | ICD-10-CM | POA: Insufficient documentation

## 2016-10-26 DIAGNOSIS — G43909 Migraine, unspecified, not intractable, without status migrainosus: Secondary | ICD-10-CM | POA: Diagnosis present

## 2016-10-26 DIAGNOSIS — M719 Bursopathy, unspecified: Secondary | ICD-10-CM

## 2016-10-26 NOTE — ED Provider Notes (Signed)
By signing my name below, I, Margit Banda, attest that this documentation has been prepared under the direction and in the presence of Brylea Pita, Delice Bison, DO. Electronically Signed: Margit Banda, ED Scribe. 10/26/16. 11:46 PM.  TIME SEEN: 11:32 PM  CHIEF COMPLAINT: Numbness  HPI: Lisa Levine is a 41 y.o. female with a PMHx of IDDM, migraines and neuropathy who presents to the Emergency Department complaining of intermittent numbness to right arm for the last four weeks, worsening today. Pt reports that while she was driving today her arm completley gave out on her ~ 8:30 pm that lasted ~ 20 - 30 minutes. She was unable to feel or move her arm. States she has had some intermittent sharp pain in her right elbow that is worse with movement. No injury to this arm. No swelling to the arm. She has been trying to get in with her PCP, but he has been on vacation. Pt has been seeing a Neurologist, and has an appointment this Friday, 10/29/16. She notes in January 2018, there was suspicion of a minor stroke. She had collapsed on the floor and didn't remember what happened afterwards. There was no definitive result from the MRI. Her bilateral lower extremity neuropathy feels like a sharp pain with a burning sensation which is different then today's onset. She thought she could just have neuropathy in the right arm. Has taken Advil, tylenol and ibuprofen with mild to no relief. Doesn't smoke. No recent head or neck injury. Pt is right handed. Patient does state that she used to be on antiplatelet but stopped this medication. It also appears per her chart she was diagnosed with hypertension and hyperlipidemia. She denies that she is on medications for these. She states that she has not had any migraine headaches with these episodes. No history of complicated migraines before.  ROS: See HPI Constitutional: no fever  Eyes: no drainage  ENT: no runny nose   Cardiovascular:  no chest pain  Resp: no SOB  GI: no  vomiting GU: no dysuria Integumentary: no rash  Allergy: no hives  Musculoskeletal: no leg swelling  Neurological: no slurred speech ROS otherwise negative  PAST MEDICAL HISTORY/PAST SURGICAL HISTORY:  Past Medical History:  Diagnosis Date  . Allergy   . Arthritis   . DVT (deep venous thrombosis) ([place])    "often on my LLE" (06/04/2016)  . Headache    "probably weekly" (06/04/2016)  . Hyperlipidemia   . Hypertension   . Migraine    "q couple months" (06/04/2016)  . Peripheral neuropathy   . Thyroid disease   . Type II diabetes mellitus ([place])   . Vitamin D deficiency     MEDICATIONS:  Prior to Admission medications   Medication Sig Start Date End Date Taking? Authorizing Provider  amitriptyline (ELAVIL) 10 MG tablet Take 20 mg by mouth daily. 07/12/16   [provider]  aspirin-acetaminophen-caffeine (EXCEDRIN MIGRAINE) [phone] MG tablet Take 2 tablets by mouth every 6 (six) hours as needed for headache or migraine.    [provider]  ciprofloxacin (CILOXAN) 0.3 % ophthalmic ointment Place into both eyes 3 (three) times daily. 09/08/16   Colon Branch, MD  ciprofloxacin (CILOXAN) 0.3 % ophthalmic solution Place 1 drop into both eyes every 4 (four) hours while awake. 09/08/16   Colon Branch, MD  cyclobenzaprine (FLEXERIL) 5 MG tablet Take 1 tablet (5 mg total) by mouth at bedtime. 07/27/16   Saguier, Percell Miller, PA-C  fluticasone (FLONASE) 50 MCG/ACT nasal spray SHAKE  LIQUID AND USE 2 SPRAYS IN [facility] NOSTRIL DAILY 09/20/16   Saguier, Percell Miller, PA-C  gabapentin (NEURONTIN) 600 MG tablet Take 600 mg by mouth 3 (three) times daily.     [provider]  Insulin Human (INSULIN PUMP) SOLN Inject into the skin continuous. Use with Humalog - basal rate 0.75 unit/hour    [provider]  insulin NPH Human (HUMULIN N,NOVOLIN N) 100 UNIT/ML injection Inject 6-15 Units into the skin See admin instructions. Inject 6-15 units subcutaneously 3 times daily as needed for CBG  >250 - in addition to Humalog in insulin pump (1 unit for every 15 g carbs consumed)    [provider]  meloxicam (MOBIC) 7.5 MG tablet Take 1 tablet (7.5 mg total) by mouth daily. 07/27/16   Saguier, Percell Miller, PA-C  pravastatin (PRAVACHOL) 20 MG tablet Take 20 mg by mouth daily.    [provider]  SUMAtriptan (IMITREX) 25 MG tablet Take 1 tablet (25 mg total) by mouth every 2 (two) hours as needed for migraine. May repeat in 2 hours if headache persists or recurs. 06/05/16   Elwin Mocha, MD  traMADol (ULTRAM) 50 MG tablet Take 1 tablet (50 mg total) by mouth every 12 (twelve) hours as needed for severe pain. Patient not taking: Reported on 09/08/2016 07/27/16   Saguier, Percell Miller, PA-C  Vitamin D, Ergocalciferol, (DRISDOL) 50000 units CAPS capsule Take 50,000 Units by mouth every Monday.    [provider]    ALLERGIES:  No Known Allergies  SOCIAL HISTORY:  Social History  Substance Use Topics  . Smoking status: Never Smoker  . Smokeless tobacco: Never Used  . Alcohol use No    FAMILY HISTORY: Family History  Problem Relation Age of Onset  . Breast cancer Mother   . Hyperlipidemia Father   . Hypertension Father   . Colon cancer Paternal Grandfather   . Lung cancer Paternal Uncle        smoler  . Liver disease Paternal Uncle     EXAM: BP 129/88 (BP Location: Left Arm)   Pulse 68   Temp 98.6 F (37 C)   Resp 16   Ht 4\' 10"  (1.473 m)   Wt 105 lb (47.6 kg)   SpO2 98%   BMI 21.95 kg/m    CONSTITUTIONAL: Alert and oriented and responds appropriately to questions. Well-appearing; well-nourished HEAD: Normocephalic EYES: Conjunctivae clear, pupils appear equal, EOMI ENT: normal nose; moist mucous membranes NECK: Supple, no meningismus, no nuchal rigidity, no LAD  CARD: RRR; S1 and S2 appreciated; no murmurs, no clicks, no rubs, no gallops RESP: Normal chest excursion without splinting or tachypnea; breath sounds clear and equal bilaterally; no  wheezes, no rhonchi, no rales, no hypoxia or respiratory distress, speaking full sentences ABD/GI: Normal bowel sounds; non-distended; soft, non-tender, no rebound, no guarding, no peritoneal signs, no hepatosplenomegaly BACK:  The back appears normal and is non-tender to palpation, there is no CVA tenderness EXT: Normal ROM in all joints; non-tender to palpation; no edema; normal capillary refill; no cyanosis, no calf tenderness or swelling; 2+ radial and DP pulses bilaterally   SKIN: Normal color for age and race; warm; no rash NEURO: Moves all extremities equally; Strength 5/5 in all four extremities.  Patient has decreased sensation throughout the entire right arm, otherwise normal sensation diffusely.  CN 2-12 grossly intact.  No dysmetria to finger to nose testing bilaterally.  Normal speech.  Normal gait. Normal reflexes in bilateral upper extremity. No clonus. PSYCH: The patient's  mood and manner are appropriate. Grooming and personal hygiene are appropriate.  MEDICAL DECISION MAKING: Patient here with complaints of right arm weakness for the past 4 weeks. No neck pain or other radicular symptoms. She states she had an episode today with the right arm went completely flaccid while driving her vehicle and this lasted for approximately 30 minutes. States that her arm was completely non-and she would potentially very hard and was not able to feel it at all. This concerned her as this is worse than her chronic symptoms of tingling in the arm. She has documented history of hypertension, diabetes, hyperlipidemia but states she is not on anything for blood pressure or high cholesterol. She states her hemoglobin A1c is usually above 7. She is not a smoker. She is not on antiplatelets or anticoagulants. At this time she has decreased sensation throughout the right arm, stroke scale would be a 1. She is outside TPA window given symptoms of right arm numbness have been present for several weeks and episode of  worsening numbness and weakness today has completely resolved. I discussed with her that I'm concerned about TIA. Have recommended labs, urine, head CT, EKG. Patient comfortable with this plan. She is requesting an x-ray of her right arm. Discussed with her without any injury or sign of bony tenderness, bony deformity do not feel an x-ray would be helpful. She agrees with this plan.  ED PROGRESS: Patient's labs are unremarkable. CT of the head shows no acute abnormality. We'll discuss with neurologist on call.   1:50 AM  D/w Dr. Leonel Ramsay with neurology. Appreciate his help. He agrees that this sounds like a TIA recommends medical admission. He feels patient should likely go back on aspirin and a statin. We'll give her a dose of aspirin here. Have discussed with patient and family that we have recommended admission. Patient is reluctant but agrees. Her PCP is with [facility].  We'll discuss with hospitalist at [facility].  2:03 AM Discussed patient's case with hospitalist, Dr. Loleta Books.  I have recommended admission and patient (and family if present) agree with this plan. Admitting physician will place admission orders.   I reviewed all nursing notes, vitals, pertinent previous records, EKGs, lab and urine results, imaging (as available).     EKG Interpretation  Date/Time:  Wednesday October 27 2016 00:02:28 EDT Ventricular Rate:  66 PR Interval:    QRS Duration: 89 QT Interval:  430 QTC Calculation: 451 R Axis:   99 Text Interpretation:  Sinus rhythm Consider right ventricular hypertrophy No significant change since last tracing Confirmed by Pryor Curia [phone]) on 10/27/2016 12:07:08 AM        I personally performed the services described in this documentation, which was scribed in my presence. The recorded information has been reviewed and is accurate.     Bonnita Newby, Delice Bison, DO 10/27/16 [phone]

## 2016-10-26 NOTE — ED Notes (Signed)
Patient stated that she had  numbness for 2 to 3 weeks and this morning while she was driving her right arm becomes numb and she tried to pinch her arm but she can not even feel it.  She had to pull over and start punching her arm and later, she felt the tingling to her right arm.  She also stated that she felt the burning sensation to her right elbow.  She also was diagnosed with neuropathy 15 years ago. She felt numbness to both of her legs and burning sensation to the bottom of her foot and tingling sensation to both of her heels.  Patient is also diabetic and is on insulin pump.

## 2016-10-26 NOTE — ED Triage Notes (Addendum)
Numbness in her right arm off and on for a few weeks. Today the numbness was the worse lasting longer than previous times. States it feels like her diabetic neuropathy in her legs. She is ambulatory. No distress. Arm is warm. Radial pulse palpated. Strong grip.

## 2016-10-26 NOTE — ED Notes (Signed)
ED Provider at bedside. 

## 2016-10-27 ENCOUNTER — Emergency Department (HOSPITAL_BASED_OUTPATIENT_CLINIC_OR_DEPARTMENT_OTHER): Payer: Medicare PPO

## 2016-10-27 ENCOUNTER — Observation Stay (HOSPITAL_COMMUNITY): Payer: Medicare PPO

## 2016-10-27 ENCOUNTER — Observation Stay (HOSPITAL_BASED_OUTPATIENT_CLINIC_OR_DEPARTMENT_OTHER): Payer: Medicare PPO

## 2016-10-27 DIAGNOSIS — E876 Hypokalemia: Secondary | ICD-10-CM | POA: Diagnosis present

## 2016-10-27 DIAGNOSIS — M7020 Olecranon bursitis, unspecified elbow: Secondary | ICD-10-CM

## 2016-10-27 DIAGNOSIS — R29898 Other symptoms and signs involving the musculoskeletal system: Secondary | ICD-10-CM

## 2016-10-27 DIAGNOSIS — G451 Carotid artery syndrome (hemispheric): Secondary | ICD-10-CM

## 2016-10-27 DIAGNOSIS — R200 Anesthesia of skin: Secondary | ICD-10-CM

## 2016-10-27 DIAGNOSIS — E785 Hyperlipidemia, unspecified: Secondary | ICD-10-CM

## 2016-10-27 DIAGNOSIS — Z794 Long term (current) use of insulin: Secondary | ICD-10-CM

## 2016-10-27 DIAGNOSIS — R202 Paresthesia of skin: Secondary | ICD-10-CM | POA: Diagnosis not present

## 2016-10-27 DIAGNOSIS — E119 Type 2 diabetes mellitus without complications: Secondary | ICD-10-CM | POA: Diagnosis not present

## 2016-10-27 DIAGNOSIS — D649 Anemia, unspecified: Secondary | ICD-10-CM | POA: Diagnosis present

## 2016-10-27 DIAGNOSIS — M719 Bursopathy, unspecified: Secondary | ICD-10-CM

## 2016-10-27 DIAGNOSIS — G43809 Other migraine, not intractable, without status migrainosus: Secondary | ICD-10-CM

## 2016-10-27 DIAGNOSIS — G459 Transient cerebral ischemic attack, unspecified: Secondary | ICD-10-CM

## 2016-10-27 LAB — COMPREHENSIVE METABOLIC PANEL
ALT: 14 U/L (ref 14–54)
ANION GAP: 7 (ref 5–15)
AST: 21 U/L (ref 15–41)
Albumin: 4.1 g/dL (ref 3.5–5.0)
Alkaline Phosphatase: 42 U/L (ref 38–126)
BILIRUBIN TOTAL: 0.6 mg/dL (ref 0.3–1.2)
BUN: 10 mg/dL (ref 6–20)
CHLORIDE: 102 mmol/L (ref 101–111)
CO2: 28 mmol/L (ref 22–32)
Calcium: 9.1 mg/dL (ref 8.9–10.3)
Creatinine, Ser: 0.65 mg/dL (ref 0.44–1.00)
Glucose, Bld: 188 mg/dL — ABNORMAL HIGH (ref 65–99)
POTASSIUM: 3.3 mmol/L — AB (ref 3.5–5.1)
Sodium: 137 mmol/L (ref 135–145)
TOTAL PROTEIN: 7.6 g/dL (ref 6.5–8.1)

## 2016-10-27 LAB — CBC
HEMATOCRIT: 34.9 % — AB (ref 36.0–46.0)
Hemoglobin: 11.6 g/dL — ABNORMAL LOW (ref 12.0–15.0)
MCH: 32 pg (ref 26.0–34.0)
MCHC: 33.2 g/dL (ref 30.0–36.0)
MCV: 96.4 fL (ref 78.0–100.0)
Platelets: 266 10*3/uL (ref 150–400)
RBC: 3.62 MIL/uL — ABNORMAL LOW (ref 3.87–5.11)
RDW: 11.8 % (ref 11.5–15.5)
WBC: 6.5 10*3/uL (ref 4.0–10.5)

## 2016-10-27 LAB — URINALYSIS, ROUTINE W REFLEX MICROSCOPIC
BILIRUBIN URINE: NEGATIVE
GLUCOSE, UA: 100 mg/dL — AB
Hgb urine dipstick: NEGATIVE
KETONES UR: NEGATIVE mg/dL
Leukocytes, UA: NEGATIVE
Nitrite: NEGATIVE
PH: 6 (ref 5.0–8.0)
Protein, ur: NEGATIVE mg/dL
SPECIFIC GRAVITY, URINE: 1.018 (ref 1.005–1.030)

## 2016-10-27 LAB — DIFFERENTIAL
BASOS ABS: 0 10*3/uL (ref 0.0–0.1)
BASOS PCT: 0 %
EOS ABS: 0.2 10*3/uL (ref 0.0–0.7)
EOS PCT: 2 %
Lymphocytes Relative: 34 %
Lymphs Abs: 2.2 10*3/uL (ref 0.7–4.0)
MONO ABS: 0.6 10*3/uL (ref 0.1–1.0)
MONOS PCT: 9 %
Neutro Abs: 3.6 10*3/uL (ref 1.7–7.7)
Neutrophils Relative %: 55 %

## 2016-10-27 LAB — RAPID URINE DRUG SCREEN, HOSP PERFORMED
AMPHETAMINES: NOT DETECTED
Barbiturates: NOT DETECTED
Benzodiazepines: NOT DETECTED
Cocaine: NOT DETECTED
OPIATES: NOT DETECTED
TETRAHYDROCANNABINOL: NOT DETECTED

## 2016-10-27 LAB — PROTIME-INR
INR: 0.93
Prothrombin Time: 12.5 seconds (ref 11.4–15.2)

## 2016-10-27 LAB — GLUCOSE, CAPILLARY: GLUCOSE-CAPILLARY: 254 mg/dL — AB (ref 65–99)

## 2016-10-27 LAB — ETHANOL

## 2016-10-27 LAB — APTT: APTT: 31 s (ref 24–36)

## 2016-10-27 LAB — TROPONIN I: Troponin I: <0.03 ng/mL (ref <0.03)

## 2016-10-27 LAB — MAGNESIUM: MAGNESIUM: 1.9 mg/dL (ref 1.7–2.4)

## 2016-10-27 MED ORDER — ACETAMINOPHEN 325 MG PO TABS: 650.0000 mg | ORAL_TABLET | ORAL | Status: DC | PRN

## 2016-10-27 MED ORDER — MELOXICAM 7.5 MG PO TABS
7.5000 mg | ORAL_TABLET | Freq: Every day | ORAL | Status: DC
  Administered 2016-10-27: 7.5 mg via ORAL
  Filled 2016-10-27: qty 1

## 2016-10-27 MED ORDER — INSULIN NPH (HUMAN) (ISOPHANE) 100 UNIT/ML ~~LOC~~ SUSP: 6.0000 [IU] | Freq: Three times a day (TID) | SUBCUTANEOUS | Status: DC | PRN

## 2016-10-27 MED ORDER — POTASSIUM CHLORIDE CRYS ER 20 MEQ PO TBCR
40.0000 meq | EXTENDED_RELEASE_TABLET | ORAL | Status: AC
  Administered 2016-10-27: 40 meq via ORAL
  Filled 2016-10-27: qty 2

## 2016-10-27 MED ORDER — ENOXAPARIN SODIUM 30 MG/0.3ML ~~LOC~~ SOLN: 30.0000 mg | SUBCUTANEOUS | Status: DC

## 2016-10-27 MED ORDER — AMITRIPTYLINE HCL 10 MG PO TABS: 20.0000 mg | ORAL_TABLET | Freq: Every day | ORAL | Status: DC

## 2016-10-27 MED ORDER — INSULIN PUMP
Freq: Three times a day (TID) | SUBCUTANEOUS | Status: DC
  Administered 2016-10-27: 08:00:00 via SUBCUTANEOUS
  Filled 2016-10-27: qty 1

## 2016-10-27 MED ORDER — AMITRIPTYLINE HCL 10 MG PO TABS: 10.0000 mg | ORAL_TABLET | Freq: Every day | ORAL | Status: DC

## 2016-10-27 MED ORDER — ASPIRIN EC 81 MG PO TBEC: 81.0000 mg | DELAYED_RELEASE_TABLET | Freq: Every day | ORAL | Status: DC

## 2016-10-27 MED ORDER — ASPIRIN 81 MG PO TBEC: 81.0000 mg | DELAYED_RELEASE_TABLET | Freq: Every day | ORAL | 0 refills | Status: DC

## 2016-10-27 MED ORDER — SENNOSIDES-DOCUSATE SODIUM 8.6-50 MG PO TABS: 1.0000 | ORAL_TABLET | Freq: Every evening | ORAL | Status: DC | PRN

## 2016-10-27 MED ORDER — STROKE: EARLY STAGES OF RECOVERY BOOK: Freq: Once | Status: DC

## 2016-10-27 MED ORDER — INSULIN PUMP
SUBCUTANEOUS | Status: DC
  Filled 2016-10-27: qty 1

## 2016-10-27 MED ORDER — ACETAMINOPHEN 650 MG RE SUPP: 650.0000 mg | RECTAL | Status: DC | PRN

## 2016-10-27 MED ORDER — GABAPENTIN 600 MG PO TABS
600.0000 mg | ORAL_TABLET | Freq: Three times a day (TID) | ORAL | Status: DC
  Administered 2016-10-27 (×2): 600 mg via ORAL
  Filled 2016-10-27 (×2): qty 1

## 2016-10-27 MED ORDER — PRAVASTATIN SODIUM 20 MG PO TABS
20.0000 mg | ORAL_TABLET | Freq: Every day | ORAL | Status: DC
  Administered 2016-10-27: 20 mg via ORAL
  Filled 2016-10-27: qty 1

## 2016-10-27 MED ORDER — CYCLOBENZAPRINE HCL 10 MG PO TABS: 5.0000 mg | ORAL_TABLET | Freq: Every day | ORAL | Status: DC

## 2016-10-27 MED ORDER — ASPIRIN 81 MG PO CHEW
324.0000 mg | CHEWABLE_TABLET | Freq: Once | ORAL | Status: AC
  Administered 2016-10-27: 324 mg via ORAL
  Filled 2016-10-27: qty 4

## 2016-10-27 MED ORDER — GADOBENATE DIMEGLUMINE 529 MG/ML IV SOLN
10.0000 mL | Freq: Once | INTRAVENOUS | Status: AC
  Administered 2016-10-27: 10 mL via INTRAVENOUS

## 2016-10-27 MED ORDER — FLUTICASONE PROPIONATE 50 MCG/ACT NA SUSP
2.0000 | Freq: Every day | NASAL | Status: DC
  Filled 2016-10-27: qty 16

## 2016-10-27 MED ORDER — SUMATRIPTAN SUCCINATE 25 MG PO TABS: 25.0000 mg | ORAL_TABLET | Freq: Two times a day (BID) | ORAL | Status: DC | PRN

## 2016-10-27 MED ORDER — ACETAMINOPHEN 160 MG/5ML PO SOLN: 650.0000 mg | ORAL | Status: DC | PRN

## 2016-10-27 NOTE — Progress Notes (Signed)
41 year old female with IDDM (insulin pump) and HTN who presents with 4 weeks right arm numbness, and then tonight around 8:30 PM right arm weakness lasting for 30 minutes.  BP 129/62 (BP Location: Left Arm)   Pulse 72   Temp 98 F (36.7 C)   Resp 14   Ht 4\' 10"  (1.473 m)   Wt 47.6 kg (105 lb)   SpO2 100%   BMI 21.95 kg/m  Sodium 137, potassium 3.3, creatinine 0.65, WBC 6.5K, hemoglobin 11.6 Troponin negative UDS negative Alcohol negative CT head unremarkable Case discussed with neurology recommended TIA workup and patient reluctantly in agreement.  To telemetry, OBS status

## 2016-10-27 NOTE — H&P (Signed)
History and Physical    [name] GMW:[id number] DOB: [birth year] DOA: 10/26/2016  Referring MD/NP/PA: Dr. Myrene Buddy PCP: Mackie Pai, PA-C  Patient coming from:  [facility] transfer  Chief Complaint: Right arm weakness  HPI: Lisa Levine is a 41 y.o. female with medical history significant of IDDM with neuropathy, and migraine headaches; who presents with complaints of acute onset of right arm weakness. Patient notes that she's had intermittent numbness in the right arm over the last month. However, last night while driving around 4:40 p.m. her right arm went limp and fell off the steering wheel. She reports being unable to move for feel her arm. The patient tried pinching her arm and shaking it without relief of symptoms. Denies ever having symptoms like this previously in the past. She was previously evaluated by neurologist after syncopal episode which was thought to be possibly related to migraine headaches.   ED Course: Upon admission to the emergency department patient was seen to be afebrile pulse 57-74, and all other vital signs within normal limits. Labs revealed hemoglobin 11.6, potassium 3.3, glucose 188. CT scan of brain showed no acute abnormalities. Case was discussed with neurology who recommended patient be transferred to [facility] for completion of stroke workup.  Review of Systems: As per HPI otherwise 10 point review of systems negative.   Past Medical History:  Diagnosis Date  . Allergy   . Arthritis   . DVT (deep venous thrombosis) ([place])    "often on my LLE" (06/04/2016)  . Headache    "probably weekly" (06/04/2016)  . Hyperlipidemia   . Hypertension   . Migraine    "q couple months" (06/04/2016)  . Peripheral neuropathy   . Thyroid disease   . Type II diabetes mellitus ([place])   . Vitamin D deficiency     Past Surgical History:  Procedure Laterality Date  . insulin pump  2005-present   "upgrades q 3 yrs" (06/04/2016)     reports that she has never smoked.  She has never used smokeless tobacco. She reports that she does not drink alcohol or use drugs.  No Known Allergies  Family History  Problem Relation Age of Onset  . Breast cancer Mother   . Hyperlipidemia Father   . Hypertension Father   . Colon cancer Paternal Grandfather   . Lung cancer Paternal Uncle        smoler  . Liver disease Paternal Uncle     Prior to Admission medications   Medication Sig Start Date End Date Taking? Authorizing Provider  amitriptyline (ELAVIL) 10 MG tablet Take 20 mg by mouth daily. 07/12/16   [provider]  aspirin-acetaminophen-caffeine (EXCEDRIN MIGRAINE) [phone] MG tablet Take 2 tablets by mouth every 6 (six) hours as needed for headache or migraine.    [provider]  ciprofloxacin (CILOXAN) 0.3 % ophthalmic ointment Place into both eyes 3 (three) times daily. 09/08/16   Colon Branch, MD  ciprofloxacin (CILOXAN) 0.3 % ophthalmic solution Place 1 drop into both eyes every 4 (four) hours while awake. 09/08/16   Colon Branch, MD  cyclobenzaprine (FLEXERIL) 5 MG tablet Take 1 tablet (5 mg total) by mouth at bedtime. 07/27/16   Saguier, Percell Miller, PA-C  fluticasone [facility]) 50 MCG/ACT nasal spray SHAKE LIQUID AND USE 2 SPRAYS IN [facility] NOSTRIL DAILY 09/20/16   Saguier, Percell Miller, PA-C  gabapentin (NEURONTIN) 600 MG tablet Take 600 mg by mouth 3 (three) times daily.     [provider]  Insulin  Human (INSULIN PUMP) SOLN Inject into the skin continuous. Use with Humalog - basal rate 0.75 unit/hour    [provider]  insulin NPH Human (HUMULIN N,NOVOLIN N) 100 UNIT/ML injection Inject 6-15 Units into the skin See admin instructions. Inject 6-15 units subcutaneously 3 times daily as needed for CBG >250 - in addition to Humalog in insulin pump (1 unit for every 15 g carbs consumed)    [provider]  meloxicam (MOBIC) 7.5 MG tablet Take 1 tablet (7.5 mg total) by mouth daily. 07/27/16   Saguier, Percell Miller, PA-C  pravastatin  (PRAVACHOL) 20 MG tablet Take 20 mg by mouth daily.    [provider]  SUMAtriptan (IMITREX) 25 MG tablet Take 1 tablet (25 mg total) by mouth every 2 (two) hours as needed for migraine. May repeat in 2 hours if headache persists or recurs. 06/05/16   Elwin Mocha, MD  traMADol (ULTRAM) 50 MG tablet Take 1 tablet (50 mg total) by mouth every 12 (twelve) hours as needed for severe pain. Patient not taking: Reported on 09/08/2016 07/27/16   Saguier, Percell Miller, PA-C  Vitamin D, Ergocalciferol, (DRISDOL) 50000 units CAPS capsule Take 50,000 Units by mouth every Monday.    [provider]    Physical Exam: Vitals:   10/27/16 5643 10/27/16 0205 10/27/16 0230 10/27/16 0346  BP:  100/62 108/72 (!) 111/55  Pulse:  74 64 (!) 57  Resp:  13 14 14   Temp: 98 F (36.7 C)   98.5 F (36.9 C)  TempSrc:    Oral  SpO2:  99% 99% 99%  Weight:    44.3 kg (97 lb 9.6 oz)  Height:    4\' 10"  (1.473 m)      Constitutional: NAD, calm, comfortable Vitals:   10/27/16 0053 10/27/16 0205 10/27/16 0230 10/27/16 0346  BP:  100/62 108/72 (!) 111/55  Pulse:  74 64 (!) 57  Resp:  13 14 14   Temp: 98 F (36.7 C)   98.5 F (36.9 C)  TempSrc:    Oral  SpO2:  99% 99% 99%  Weight:    44.3 kg (97 lb 9.6 oz)  Height:    4\' 10"  (1.473 m)   Eyes: PERRL, lids and conjunctivae normal ENMT: Mucous membranes are moist. Posterior pharynx clear of any exudate or lesions.Normal dentition.  Neck: normal, supple, no masses, no thyromegaly Respiratory: clear to auscultation bilaterally, no wheezing, no crackles. Normal respiratory effort. No accessory muscle use.  Cardiovascular: Regular rate and rhythm, no murmurs / rubs / gallops. No extremity edema. 2+ pedal pulses. No carotid bruits.  Abdomen: no tenderness, no masses palpated. No hepatosplenomegaly. Bowel sounds positive.  Musculoskeletal: no clubbing / cyanosis. No joint deformity upper and lower extremities. Good ROM, no contractures. Normal muscle tone.    Skin: no rashes, lesions, ulcers. No induration Neurologic: CN 2-12 grossly intact. Sensation abnormal, DTR normal. Strength 5/5 in all 4.  Psychiatric: Normal judgment and insight. Alert and oriented x 3. Normal mood.     Labs on Admission: I have personally reviewed following labs and imaging studies  CBC:  Recent Labs Lab 10/27/16 0036  WBC 6.5  NEUTROABS 3.6  HGB 11.6*  HCT 34.9*  MCV 96.4  PLT 329   Basic Metabolic Panel:  Recent Labs Lab 10/27/16 0036  NA 137  K 3.3*  CL 102  CO2 28  GLUCOSE 188*  BUN 10  CREATININE 0.65  CALCIUM 9.1   GFR: Estimated Creatinine Clearance: 60.4 mL/min (by C-G formula  based on SCr of 0.65 mg/dL). Liver Function Tests:  Recent Labs Lab 10/27/16 0036  AST 21  ALT 14  ALKPHOS 42  BILITOT 0.6  PROT 7.6  ALBUMIN 4.1   No results for input(s): LIPASE, AMYLASE in the last 168 hours. No results for input(s): AMMONIA in the last 168 hours. Coagulation Profile:  Recent Labs Lab 10/27/16 0036  INR 0.93   Cardiac Enzymes:  Recent Labs Lab 10/27/16 0036  TROPONINI <0.03   BNP (last 3 results) No results for input(s): PROBNP in the last 8760 hours. HbA1C: No results for input(s): HGBA1C in the last 72 hours. CBG:  Recent Labs Lab 10/27/16 0427  GLUCAP 254*   Lipid Profile: No results for input(s): CHOL, HDL, LDLCALC, TRIG, CHOLHDL, LDLDIRECT in the last 72 hours. Thyroid Function Tests: No results for input(s): TSH, T4TOTAL, FREET4, T3FREE, THYROIDAB in the last 72 hours. Anemia Panel: No results for input(s): VITAMINB12, FOLATE, FERRITIN, TIBC, IRON, RETICCTPCT in the last 72 hours. Urine analysis:    Component Value Date/Time   COLORURINE YELLOW 10/27/2016 0049   APPEARANCEUR CLEAR 10/27/2016 0049   LABSPEC 1.018 10/27/2016 0049   PHURINE 6.0 10/27/2016 0049   GLUCOSEU 100 (A) 10/27/2016 0049   HGBUR NEGATIVE 10/27/2016 0049   BILIRUBINUR NEGATIVE 10/27/2016 0049   KETONESUR NEGATIVE 10/27/2016 0049    PROTEINUR NEGATIVE 10/27/2016 0049   UROBILINOGEN 1.0 12/18/2013 2201   NITRITE NEGATIVE 10/27/2016 0049   LEUKOCYTESUR NEGATIVE 10/27/2016 0049   Sepsis Labs: No results found for this or any previous visit (from the past 240 hour(s)).   Radiological Exams on Admission: Ct Head Wo Contrast  Result Date: 10/27/2016 CLINICAL DATA:  Right arm numbness times several weeks EXAM: CT HEAD WITHOUT CONTRAST TECHNIQUE: Contiguous axial images were obtained from the base of the skull through the vertex without intravenous contrast. COMPARISON:  06/04/2016 FINDINGS: Brain: No evidence of acute infarction, hemorrhage, hydrocephalus, extra-axial collection or mass lesion/mass effect. Vascular: No hyperdense vessel or unexpected calcification. Skull: Normal. Negative for fracture or focal lesion. Sinuses/Orbits: No acute finding. Other: None. IMPRESSION: Stable CT appearance of the brain. No acute intracranial abnormality identified. Electronically Signed   By: Ashley Royalty M.D.   On: 10/27/2016 01:33    EKG: Independently reviewed. Sinus rhythm  Assessment/Plan Right arm weakness and numbness /suspected TIA: Patient with acute onset of right arm weakness while driving symptoms lasting less than 30 minutes and then self resolving. Initial CT scan of the brain negative for any signs of stroke. Patient with recent echocardiogram on 05/2016 showing EF of 60-65%. On differential includes complex migraine. - admit to a telemetry bed - Neuro checks - Check lipid panel and hemoglobin A1c - Check MRI and carotid vascular ultrasound - Consult neurology   IDDM with neuropathy: Patient currently on insulin pump. - Hypoglycemic protocol - Continue insulin pump per protocol - Continue gabapentin and amitriptyline  Hypokalemia: Initial potassium 3.3 on admission. - Give 40 mEq of potassium chloride 1 dose now - Continue to monitor and replace as needed  Migraine headaches - Continue Sumatriptan  prn  Normocytic normochromic anemia: Hemoglobin 11.6, but baseline appears to be previously above 12. - Continue to monitor  Hyperlipidemia - Continue pravastatin  DVT prophylaxis: Lovenox   Code Status: Full Family Communication:  No family present at bedside Disposition Plan: Likely discharge home if medically stable  Consults called: Neurology  Admission status: Observation  Norval Morton MD [facility] Pager [phone]  If 7PM-7AM, please contact night-coverage  [email or web address] Password [facility]  10/27/2016, 4:42 AM

## 2016-10-27 NOTE — Progress Notes (Signed)
Inpatient Diabetes Program Recommendations  AACE/ADA: New Consensus Statement on Inpatient Glycemic Control (2015)  Target Ranges:  Prepandial:   less than 140 mg/dL      Peak postprandial:   less than 180 mg/dL (1-2 hours)      Critically ill patients:  140 - 180 mg/dL   Lab Results  Component Value Date   GLUCAP 254 (H) 10/27/2016   HGBA1C 8.9 (H) 02/14/2015    Review of Glycemic Control:  Results for JOZEE, HAMMER (MRN [id number]) as of 10/27/2016 13:53  Ref. Range 10/27/2016 04:27  Glucose-Capillary Latest Ref Range: 65 - 99 mg/dL 254 (H)    Diabetes history: Type 1 diabetes for 15 years Outpatient Diabetes medications: Medtronic insulin pump 12a-4a- 0.6 units/hr 4a-10a- 0.625 units/hr 10a-4p-1.35 units/hr 4p-12MN- 1.25 units/hr Total 24 hour basal=24.75 units/hr I:C ratio= 1 unit for every 7 grams of CHO Current orders for Inpatient glycemic control: Insulin pump per home settings  Inpatient Diabetes Program Recommendations:    Saw patient.  She states that she changed site on Monday.  Explained that she will need to have supplies at bedside.  She states she will have family bring in.  Explained that she will need to sign contract for insulin pump use as well.  Patient verbalized understanding.  CBG's do need to be checked by hospital meter as well.  Will discuss with RN.   Thanks, Adah Perl, RN, BC-ADM Inpatient Diabetes Coordinator Pager [phone] (8a-5p)

## 2016-10-27 NOTE — Progress Notes (Signed)
Patient admitted after midnight, please see H&P.  ? TIA vs MS.  Had another episode in January.  MRI pending-- echo done recently.  Eulogio Bear DO

## 2016-10-27 NOTE — Discharge Summary (Signed)
Physician Discharge Summary  TIFFIANY Levine EXN:[id number] DOB: [birth year] DOA: 10/26/2016  PCP: Mackie Pai, PA-C  Admit date: 10/26/2016 Discharge date: 10/27/2016   Recommendations for Outpatient Follow-Up:   1. Consider changing migraine medications 2. BMP at next office visit   Discharge Diagnosis:   Principal Problem:   TIA (transient ischemic attack) Active Problems:   Hyperlipidemia   IDDM (insulin dependent diabetes mellitus) ([place])   Migraine   Hypokalemia   Normocytic normochromic anemia   Bursitis   Discharge disposition:  Home.  Discharge Condition: Improved.  Diet recommendation:  Carbohydrate-modified.  Wound care: None.   History of Present Illness:   Lisa Levine is a 41 y.o. female with medical history significant of IDDM with neuropathy, and migraine headaches; who presents with complaints of acute onset of right arm weakness. Patient notes that she's had intermittent numbness in the right arm over the last month. However, last night while driving around 4:96 p.m. her right arm went limp and fell off the steering wheel. She reports being unable to move for feel her arm. The patient tried pinching her arm and shaking it without relief of symptoms. Denies ever having symptoms like this previously in the past. She was previously evaluated by neurologist after syncopal episode which was thought to be possibly related to migraine headaches.   ED Course: Upon admission to the emergency department patient was seen to be afebrile pulse 57-74, and all other vital signs within normal limits. Labs revealed hemoglobin 11.6, potassium 3.3, glucose 188. CT scan of brain showed no acute abnormalities. Case was discussed with neurology who recommended patient be transferred to [facility] for completion of stroke workup.   Hospital Course by Problem:   TIA -recent echo -recent HgbA1C and FLP will not repeat -MRI similar to one in January- discussed with on-call neurology--  not consistent with MS -MRI cervical spine normal -follow up with outpatinet neurologist (sees one at [facility] for peripheral neuropathy) -added ASA  Right elbow bursitis -tender on olecranon bursae -may need injection if NSAIDS/ICE do not work  Migraines -may need change of medications  Hypokalemia -repleted  Hyperlipidemia - Continue pravastatin  IDDM with neuropathy: Patient currently on insulin pump. -continue home medications  Medical Consultants:    None.   Discharge Exam:   Vitals:   10/27/16 1200 10/27/16 1400  BP: 118/61 109/68  Pulse: 81 86  Resp: 16 16  Temp: 97.6 F (36.4 C) 97.6 F (36.4 C)   Vitals:   10/27/16 0758 10/27/16 1026 10/27/16 1200 10/27/16 1400  BP: 115/73 110/68 118/61 109/68  Pulse: 76 80 81 86  Resp: 16 15 16 16   Temp: 98.1 F (36.7 C) 98.8 F (37.1 C) 97.6 F (36.4 C) 97.6 F (36.4 C)  TempSrc: Oral Oral Oral Oral  SpO2: 100% 96% 98% 98%  Weight:      Height:        Gen:  NAD-anxious to be d/c'd home    The results of significant diagnostics from this hospitalization (including imaging, microbiology, ancillary and laboratory) are listed below for reference.     Procedures and Diagnostic Studies:   Ct Head Wo Contrast  Result Date: 10/27/2016 CLINICAL DATA:  Right arm numbness times several weeks EXAM: CT HEAD WITHOUT CONTRAST TECHNIQUE: Contiguous axial images were obtained from the base of the skull through the vertex without intravenous contrast. COMPARISON:  06/04/2016 FINDINGS: Brain: No evidence of acute infarction, hemorrhage, hydrocephalus, extra-axial collection or mass lesion/mass effect. Vascular: No hyperdense  vessel or unexpected calcification. Skull: Normal. Negative for fracture or focal lesion. Sinuses/Orbits: No acute finding. Other: None. IMPRESSION: Stable CT appearance of the brain. No acute intracranial abnormality identified. Electronically Signed   By: Ashley Royalty M.D.   On: 10/27/2016 01:33   Mr Jeri Cos  PY Contrast  Result Date: 10/27/2016 CLINICAL DATA:  History of migraine headaches. Acute onset numbness and tingling of the right hand. EXAM: MRI HEAD WITHOUT AND WITH CONTRAST MRI CERVICAL SPINE WITHOUT AND WITH CONTRAST TECHNIQUE: Multiplanar, multiecho pulse sequences of the brain and surrounding structures, and cervical spine, to include the craniocervical junction and cervicothoracic junction, were obtained without and with intravenous contrast. CONTRAST:  2mL MULTIHANCE GADOBENATE DIMEGLUMINE 529 MG/ML IV SOLN COMPARISON:  1. Head CT 10/27/2016 2. Brain MRI 06/05/2016 FINDINGS: MRI HEAD FINDINGS Brain: The midline structures are normal. There is no focal diffusion restriction to indicate acute infarct. Scattered nonspecific foci of hyperintense T2 weighted signal in the subcortical and deep white matter are unchanged. No intraparenchymal hematoma or chronic microhemorrhage. Brain volume is normal for age without age-advanced or lobar predominant atrophy. The dura is normal and there is no extra-axial collection. No contrast-enhancing lesions. Vascular: Major intracranial arterial and venous sinus flow voids are preserved. Skull and upper cervical spine: The visualized skull base, calvarium, upper cervical spine and extracranial soft tissues are normal. Sinuses/Orbits: No fluid levels or advanced mucosal thickening. No mastoid effusion. Normal orbits. MRI CERVICAL SPINE FINDINGS Alignment: Normal Vertebrae: Normal Cord: Normal signal and caliber. Posterior Fossa, vertebral arteries, paraspinal tissues: Normal Disc levels: No cervical spinal canal stenosis or neural foraminal stenosis. IMPRESSION: 1. Unchanged appearance of multiple nonspecific white matter hyperintensities in the brain. Differential considerations remain broad, including migraine headaches, chronic microangiopathy, demyelinating disease or sequelae of remote infection/inflammation. No contrast-enhancing lesions. 2. Normal MRI of the cervical  spine. Electronically Signed   By: Ulyses Jarred M.D.   On: 10/27/2016 16:48   Mr Cervical Spine W Wo Contrast  Result Date: 10/27/2016 CLINICAL DATA:  History of migraine headaches. Acute onset numbness and tingling of the right hand. EXAM: MRI HEAD WITHOUT AND WITH CONTRAST MRI CERVICAL SPINE WITHOUT AND WITH CONTRAST TECHNIQUE: Multiplanar, multiecho pulse sequences of the brain and surrounding structures, and cervical spine, to include the craniocervical junction and cervicothoracic junction, were obtained without and with intravenous contrast. CONTRAST:  20mL MULTIHANCE GADOBENATE DIMEGLUMINE 529 MG/ML IV SOLN COMPARISON:  1. Head CT 10/27/2016 2. Brain MRI 06/05/2016 FINDINGS: MRI HEAD FINDINGS Brain: The midline structures are normal. There is no focal diffusion restriction to indicate acute infarct. Scattered nonspecific foci of hyperintense T2 weighted signal in the subcortical and deep white matter are unchanged. No intraparenchymal hematoma or chronic microhemorrhage. Brain volume is normal for age without age-advanced or lobar predominant atrophy. The dura is normal and there is no extra-axial collection. No contrast-enhancing lesions. Vascular: Major intracranial arterial and venous sinus flow voids are preserved. Skull and upper cervical spine: The visualized skull base, calvarium, upper cervical spine and extracranial soft tissues are normal. Sinuses/Orbits: No fluid levels or advanced mucosal thickening. No mastoid effusion. Normal orbits. MRI CERVICAL SPINE FINDINGS Alignment: Normal Vertebrae: Normal Cord: Normal signal and caliber. Posterior Fossa, vertebral arteries, paraspinal tissues: Normal Disc levels: No cervical spinal canal stenosis or neural foraminal stenosis. IMPRESSION: 1. Unchanged appearance of multiple nonspecific white matter hyperintensities in the brain. Differential considerations remain broad, including migraine headaches, chronic microangiopathy, demyelinating disease or  sequelae of remote infection/inflammation. No contrast-enhancing lesions. 2. Normal MRI  of the cervical spine. Electronically Signed   By: Ulyses Jarred M.D.   On: 10/27/2016 16:48     Labs:   Basic Metabolic Panel:  Recent Labs Lab 10/27/16 0036 10/27/16 1034  NA 137  --   K 3.3*  --   CL 102  --   CO2 28  --   GLUCOSE 188*  --   BUN 10  --   CREATININE 0.65  --   CALCIUM 9.1  --   MG  --  1.9   GFR Estimated Creatinine Clearance: 60.4 mL/min (by C-G formula based on SCr of 0.65 mg/dL). Liver Function Tests:  Recent Labs Lab 10/27/16 0036  AST 21  ALT 14  ALKPHOS 42  BILITOT 0.6  PROT 7.6  ALBUMIN 4.1   No results for input(s): LIPASE, AMYLASE in the last 168 hours. No results for input(s): AMMONIA in the last 168 hours. Coagulation profile  Recent Labs Lab 10/27/16 0036  INR 0.93    CBC:  Recent Labs Lab 10/27/16 0036  WBC 6.5  NEUTROABS 3.6  HGB 11.6*  HCT 34.9*  MCV 96.4  PLT 266   Cardiac Enzymes:  Recent Labs Lab 10/27/16 0036  TROPONINI <0.03   BNP: Invalid input(s): POCBNP CBG:  Recent Labs Lab 10/27/16 0427  GLUCAP 254*   D-Dimer No results for input(s): DDIMER in the last 72 hours. Hgb A1c No results for input(s): HGBA1C in the last 72 hours. Lipid Profile No results for input(s): CHOL, HDL, LDLCALC, TRIG, CHOLHDL, LDLDIRECT in the last 72 hours. Thyroid function studies No results for input(s): TSH, T4TOTAL, T3FREE, THYROIDAB in the last 72 hours.  Invalid input(s): FREET3 Anemia work up No results for input(s): VITAMINB12, FOLATE, FERRITIN, TIBC, IRON, RETICCTPCT in the last 72 hours. Microbiology No results found for this or any previous visit (from the past 240 hour(s)).   Discharge Instructions:   Discharge Instructions    Diet Carb Modified    Complete by:  As directed    Increase activity slowly    Complete by:  As directed      Allergies as of 10/27/2016   No Known Allergies     Medication List      STOP taking these medications   aspirin-acetaminophen-caffeine 250-250-65 MG tablet Commonly known as:  EXCEDRIN MIGRAINE   ciprofloxacin 0.3 % ophthalmic ointment Commonly known as:  CILOXAN   ciprofloxacin 0.3 % ophthalmic solution Commonly known as:  CILOXAN   cyclobenzaprine 5 MG tablet Commonly known as:  FLEXERIL   meloxicam 7.5 MG tablet Commonly known as:  MOBIC     TAKE these medications   acetaminophen 500 MG tablet Commonly known as:  TYLENOL Take 500 mg by mouth 3 (three) times daily as needed for mild pain.   amitriptyline 10 MG tablet Commonly known as:  ELAVIL Take 1 tablet (10 mg total) by mouth at bedtime.   aspirin 81 MG EC tablet Take 1 tablet (81 mg total) by mouth daily.   cetirizine 10 MG tablet Commonly known as:  ZYRTEC Take 10 mg by mouth daily as needed for allergies.   fluticasone 50 MCG/ACT nasal spray Commonly known as:  FLONASE SHAKE LIQUID AND USE 2 SPRAYS IN EACH NOSTRIL DAILY What changed:  See the new instructions.   gabapentin 600 MG tablet Commonly known as:  NEURONTIN Take 600 mg by mouth 4 (four) times daily.   insulin NPH Human 100 UNIT/ML injection Commonly known as:  HUMULIN N,NOVOLIN N Inject 6-45 Units  into the skin See admin instructions. Used withInsulin pump 35-45 units per day per basal rate...Marland KitchenMarland KitchenInject 6-12 units subcutaneously per sliding scale as needed for blood sugar correction with insulin pump   insulin pump Soln Inject into the skin continuous. Use with Humalog - basal rate   PAZEO 0.7 % Soln Generic drug:  Olopatadine HCl Place 1 drop into both eyes daily as needed for allergies.   pravastatin 20 MG tablet Commonly known as:  PRAVACHOL Take 20 mg by mouth daily.   SUMAtriptan 25 MG tablet Commonly known as:  IMITREX Take 1 tablet (25 mg total) by mouth every 2 (two) hours as needed for migraine. May repeat in 2 hours if headache persists or recurs.   traMADol 50 MG tablet Commonly known as:   ULTRAM Take 1 tablet (50 mg total) by mouth every 12 (twelve) hours as needed for severe pain.   Vitamin D (Ergocalciferol) 50000 units Caps capsule Commonly known as:  DRISDOL Take 50,000 Units by mouth every Monday.      Follow-up Information    Saguier, Percell Miller, PA-C Follow up in 1 week(s).   Specialties:  Internal Medicine, Family Medicine Contact information: [address] [place] [address] [phone]        keep appointment on Friday with neurology-may want to change migraine medication Follow up.            Time coordinating discharge: 25 min  Signed:  [place]   [facility] 10/27/2016, 5:32 PM

## 2016-10-27 NOTE — ED Notes (Signed)
Patient transported to CT 

## 2016-10-27 NOTE — Progress Notes (Signed)
*  PRELIMINARY RESULTS* Vascular Ultrasound Carotid Duplex (Doppler) has been completed.  Preliminary findings: Bilateral: No significant (1-39%) ICA stenosis. Antegrade vertebral flow.    Landry Mellow, RDMS, RVT  10/27/2016, 12:19 PM

## 2016-10-27 NOTE — Progress Notes (Signed)
A 41 years old female admitted from [facility] with c/o rt arm numbness x4 weeks. Pt alert and oriented x 4 ambulated with slight rt arm numbness and weakness the patient oriented to nsg staff and room  Cardiac monitor box  73m10 placed and in use  No c/o of pain or fever.  Call bell within pt reach to call for assist as needed. RN will continue to monitor pt.

## 2016-10-28 ENCOUNTER — Telehealth: Payer: Self-pay

## 2016-10-28 NOTE — Telephone Encounter (Signed)
10/28/16  Transition Care Management Follow-up Telephone Call  ADMISSION DATE: 10/26/16   DISCHARGE DATE: 10/27/16    How have you been since you were released from the hospital? Patient states she has felt good just has weakness.   Do you understand why you were in the hospital? YES   Do you understand the discharge instrcutions? Yes just does not understand her condition  Items Reviewed:  Medications reviewed:  Reviewed Medications with patiet .  Allergies reviewed: Reviewed allergies.  Dietary changes reviewed : Carbohydrate modified.  Referrals reviewed: Appointment scheduled   Functional Questionnaire:   Activities of Daily Living (ADLs): No help needed at this time.   Any transportation issues/concerns?: No   Any patient concerns? Numbness and Tingling in R arm.   Confirmed importance and date/time of follow-up visits scheduled: Yes   Confirmed with patient if condition begins to worsen call PCP or go to the ER. Yes    Patient was given the [facility] line [phone]: Yes

## 2016-11-03 ENCOUNTER — Ambulatory Visit (INDEPENDENT_AMBULATORY_CARE_PROVIDER_SITE_OTHER): Payer: Medicare PPO | Admitting: Family Medicine

## 2016-11-03 ENCOUNTER — Encounter: Payer: Self-pay | Admitting: Family Medicine

## 2016-11-03 VITALS — BP 100/66 | HR 78 | Temp 98.1°F | Ht 59.0 in | Wt 108.2 lb

## 2016-11-03 DIAGNOSIS — R7989 Other specified abnormal findings of blood chemistry: Secondary | ICD-10-CM | POA: Diagnosis not present

## 2016-11-03 DIAGNOSIS — Z09 Encounter for follow-up examination after completed treatment for conditions other than malignant neoplasm: Secondary | ICD-10-CM | POA: Diagnosis not present

## 2016-11-03 LAB — BASIC METABOLIC PANEL
BUN: 16 mg/dL (ref 6–23)
CHLORIDE: 102 meq/L (ref 96–112)
CO2: 30 meq/L (ref 19–32)
Calcium: 9.4 mg/dL (ref 8.4–10.5)
Creatinine, Ser: 0.65 mg/dL (ref 0.40–1.20)
GFR: 107.07 mL/min (ref >60.00)
GLUCOSE: 136 mg/dL — AB (ref 70–99)
Potassium: 3.9 mEq/L (ref 3.5–5.1)
Sodium: 138 mEq/L (ref 135–145)

## 2016-11-03 LAB — VITAMIN D 25 HYDROXY (VIT D DEFICIENCY, FRACTURES): VITD: 13.3 ng/mL — AB (ref 30.00–100.00)

## 2016-11-03 NOTE — Progress Notes (Signed)
Chief Complaint  Patient presents with  . Hospitalization Follow-up    for numbness in (R) arm-pt states stilling numbness and tingling in th (R) arm and hand-pt stated that her BS was high    HPI Lisa Levine is a 41 y.o. y.o. female who presents for a transition of care visit.  Pt was discharged from [facility] on 10/27/16.  Within 48 business hours of discharge our office contacted him via telephone to coordinate his care and needs.   Over the past several weeks, the patient has been having issues with decreased sensation, weakness, and pain in her right upper extremity. She has a history of type 1 diabetes and lower extremity neuropathy. She states it feels similar in her right arm, however is not affecting her left arm. The night before she was admitted, she was driving and noticed a worsening of the sensation in her right upper extremity. She went to the emergency department and was admitted for stroke workup. Imaging studies were unremarkable and she was discharged the following day. She is still having some issues with sensation and weakness. She has a follow-up with her neurologist, who also monitors her migraines, in 5 days.  Social History   Social History  . Marital status: Married   Occupational History  . [employer]    Social History Main Topics  . Smoking status: Never Smoker  . Smokeless tobacco: Never Used  . Alcohol use No  . Drug use: No  . Sexual activity: Yes   Past Medical History:  Diagnosis Date  . Allergy   . Arthritis   . DVT (deep venous thrombosis) ([place])    "often on my LLE" (06/04/2016)  . Hyperlipidemia   . Hypertension   . Migraine    "q couple months" (06/04/2016)  . Peripheral neuropathy   . Thyroid disease   . Type 1 diabetes mellitus ([place])   . Vitamin D deficiency    Family History  Problem Relation Age of Onset  . Breast cancer Mother   . Hyperlipidemia Father   . Hypertension Father   . Colon cancer Paternal Grandfather   . Lung  cancer Paternal Uncle        smoler  . Liver disease Paternal Uncle    Allergies as of 11/03/2016   No Known Allergies     Medication List       Accurate as of 11/03/16 12:21 PM. Always use your most recent med list.          acetaminophen 500 MG tablet Commonly known as:  TYLENOL Take 500 mg by mouth 3 (three) times daily as needed for mild pain.   amitriptyline 10 MG tablet Commonly known as:  ELAVIL Take 1 tablet (10 mg total) by mouth at bedtime.   aspirin 81 MG EC tablet Take 1 tablet (81 mg total) by mouth daily.   cetirizine 10 MG tablet Commonly known as:  ZYRTEC Take 10 mg by mouth daily as needed for allergies.   cyclobenzaprine 5 MG tablet Commonly known as:  FLEXERIL As needed   fluticasone 50 MCG/ACT nasal spray Commonly known as:  FLONASE SHAKE LIQUID AND USE 2 SPRAYS IN EACH NOSTRIL DAILY   gabapentin 600 MG tablet Commonly known as:  NEURONTIN Take 600 mg by mouth 4 (four) times daily.   insulin NPH Human 100 UNIT/ML injection Commonly known as:  HUMULIN N,NOVOLIN N Inject 6-45 Units into the skin See admin instructions. Used withInsulin pump 35-45 units per day per  basal rate...Marland KitchenMarland KitchenInject 6-12 units subcutaneously per sliding scale as needed for blood sugar correction with insulin pump   insulin pump Soln Inject into the skin continuous. Use with Humalog - basal rate   meloxicam 7.5 MG tablet Commonly known as:  MOBIC As needed   PAZEO 0.7 % Soln Generic drug:  Olopatadine HCl Place 1 drop into both eyes daily as needed for allergies.   pravastatin 20 MG tablet Commonly known as:  PRAVACHOL Take 20 mg by mouth daily.   SUMAtriptan 25 MG tablet Commonly known as:  IMITREX Take 1 tablet (25 mg total) by mouth every 2 (two) hours as needed for migraine. May repeat in 2 hours if headache persists or recurs.   traMADol 50 MG tablet Commonly known as:  ULTRAM Take 1 tablet (50 mg total) by mouth every 12 (twelve) hours as needed for severe  pain.   Vitamin D (Ergocalciferol) 50000 units Caps capsule Commonly known as:  DRISDOL Take 50,000 Units by mouth every Monday.       ROS:  Constitutional: No fevers or chills, no weight loss HEENT: No headaches, hearing loss, or runny nose, no sore throat Heart: No chest pain Lungs: No SOB, no cough Abd: No bowel changes, no pain, no N/V GU: No urinary complaints Neuro: As noted in HPI Msk: AS noted in HPI  Objective BP 100/66 (BP Location: Left Arm, Patient Position: Sitting, Cuff Size: Normal)   Pulse 78   Temp 98.1 F (36.7 C) (Oral)   Ht 4\' 11"  (1.499 m)   Wt 108 lb 3.2 oz (49.1 kg)   SpO2 99%   BMI 21.85 kg/m  General Appearance:  awake, alert, oriented, in no acute distress and well developed, well nourished Skin:  there are no suspicious lesions or rashes of concern Head/face:  NCAT Eyes:  EOMI, PERRLA Ears:  canals and TMs NI Nose/Sinuses:  negative Mouth/Throat:  Mucosa moist, no lesions; pharynx without erythema, edema or exudate. Neck:  neck- supple, no mass, non-tender and no jvd Lungs: Clear to auscultation.  No rales, rhonchi, or wheezing. Normal effort, no accessory muscle use. Heart:  Heart sounds are normal.  Regular rate and rhythm without murmur, gallop or rub. No bruits. Abdomen:  BS+, soft, NT, ND, no masses or organomegaly Musculoskeletal:  No muscle group atrophy or asymmetry, gait normal; 5/5 strength in the left upper extremity, 4/5 grip strength, shoulder abduction/adduction, elbow flexion/extension Neurologic:  Alert and oriented x 3, gait normal., reflexes normal and symmetric, decreased sensation to pinprick on the right over the fifth digit, right shoulder, lateral and medial proximal forearm, sensation is intact on the left Psych exam: Nml mood and affect, age appropriate judgment and insight  Hospital discharge follow-up - Plan: Basic metabolic panel  Low serum vitamin D - Plan: Vitamin D (25 hydroxy)  Discharge summary and medication  list have been reviewed/reconciled.  Labs pending at the time of discharge have been reviewed or are still pending at the time of this visit.  Follow-up labs and appointments have been ordered and/or coordinated appropriately. Educational materials regarding the patient's admitting diagnosis provided.  TRANSITIONAL CARE MANAGEMENT CERTIFICATION:  I certify the following are true:   1. Communication with the patient/care giver was made within 2 business days of discharge.  2. Complexity of Medical decision making is moderate.  3. Face to face visit occurred within 14 days of discharge.  Needs to f/u with Neuro- likely will receive EMG/NCT. Follow up on labs from inpt.  Other orders  as above. F/u prn. The patient voiced understanding and agreement to the plan.  [place], DO 11/03/16 12:21 PM

## 2016-11-03 NOTE — Patient Instructions (Addendum)
Let us know if you need anything.  Give [place] 2-3 business days to get the results of your labs back.

## 2016-11-22 ENCOUNTER — Encounter: Payer: Self-pay | Admitting: Medical

## 2016-11-22 DIAGNOSIS — M25521 Pain in right elbow: Secondary | ICD-10-CM

## 2016-11-25 ENCOUNTER — Ambulatory Visit (HOSPITAL_BASED_OUTPATIENT_CLINIC_OR_DEPARTMENT_OTHER)
Admission: RE | Admit: 2016-11-25 | Discharge: 2016-11-25 | Disposition: A | Discharge status: Home / Self Care | Payer: Medicare Other | Source: Ambulatory Visit | Attending: Medical | Admitting: Medical

## 2016-11-25 ENCOUNTER — Encounter: Payer: Self-pay | Admitting: Medical

## 2016-11-25 ENCOUNTER — Ambulatory Visit (INDEPENDENT_AMBULATORY_CARE_PROVIDER_SITE_OTHER): Payer: Medicare Other | Admitting: Medical

## 2016-11-25 VITALS — BP 103/66 | HR 67 | Temp 98.3°F | Resp 16 | Ht <= 58 in | Wt 110.4 lb

## 2016-11-25 DIAGNOSIS — M25521 Pain in right elbow: Secondary | ICD-10-CM | POA: Diagnosis not present

## 2016-11-25 MED ORDER — KETOROLAC TROMETHAMINE 60 MG/2ML IM SOLN
60.0000 mg | Freq: Once | INTRAMUSCULAR | Status: AC
  Administered 2016-11-25: 60 mg via INTRAMUSCULAR

## 2016-11-25 MED ORDER — VITAMIN D (ERGOCALCIFEROL) 1.25 MG (50000 UNIT) PO CAPS: 50000.0000 [IU] | ORAL_CAPSULE | ORAL | 0 refills | Status: DC

## 2016-11-25 NOTE — Patient Instructions (Addendum)
For elbow pain will xray of elbow and continue meloxicam. Can  Re-start tomorrow. Toradol 60 mg im today.  Will refer to sports medicine for evaluation of lateral epicondylitis.(can or kristy or jennifer for upate)  Follow up date to be determined for above. But recommend getting cpe sometime in fall and can get flu vaccine that date.

## 2016-11-25 NOTE — Progress Notes (Signed)
Subjective:    Patient ID: Lisa Levine, female    DOB: 1976/04/05, 41 y.o.   MRN: [id number]  HPI  Pt in for evaluation.  She is following up on rt elbow pain for 2 months. Pt has been applying lidocaine patch but pain is worse. She states minimal acitivity such as holding coffee mug hurts. Pt is rt handed. No repetitive movements. Pt thinks it is tennis elbow. Pt has been on meloxicam and some  Ibuprofen(made aware not to use both).  Pt states she has seen neurologist has switched to [facility]. Pt states neurologist thought mostly neuropathy. Pt states gabapentin has not helped   Review of Systems  Constitutional: Negative for chills, fatigue and fever.  Respiratory: Negative for cough, chest tightness, shortness of breath and wheezing.   Cardiovascular: Negative for chest pain and palpitations.  Gastrointestinal: Negative for abdominal pain.  Musculoskeletal: Negative for arthralgias, joint swelling and neck stiffness.       Rt elbow pain.  Skin: Negative for rash.  Neurological: Negative for dizziness and headaches.  Hematological: Negative for adenopathy. Does not bruise/bleed easily.  Psychiatric/Behavioral: Negative for confusion, self-injury and sleep disturbance. The patient is not nervous/anxious.     Past Medical History:  Diagnosis Date  . Allergy   . Arthritis   . DVT (deep venous thrombosis) ([place])    "often on my LLE" (06/04/2016)  . Hyperlipidemia   . Hypertension   . Migraine    "q couple months" (06/04/2016)  . Peripheral neuropathy   . Thyroid disease   . Type 1 diabetes mellitus ([place])   . Vitamin D deficiency      Social History   Social History  . Marital status: Married    Spouse name: N/A  . Number of children: 3  . Years of education: N/A   Occupational History  . [employer]    Social History Main Topics  . Smoking status: Never Smoker  . Smokeless tobacco: Never Used  . Alcohol use No  . Drug use: No  . Sexual activity: Yes    Other Topics Concern  . Not on file   Social History Narrative  . No narrative on file    Past Surgical History:  Procedure Laterality Date  . insulin pump  2005-present   "upgrades q 3 yrs" (06/04/2016)    Family History  Problem Relation Age of Onset  . Breast cancer Mother   . Hyperlipidemia Father   . Hypertension Father   . Colon cancer Paternal Grandfather   . Lung cancer Paternal Uncle        smoler  . Liver disease Paternal Uncle     No Known Allergies  Current Outpatient Prescriptions on File Prior to Visit  Medication Sig Dispense Refill  . acetaminophen (TYLENOL) 500 MG tablet Take 500 mg by mouth 3 (three) times daily as needed for mild pain.    Marland Kitchen amitriptyline (ELAVIL) 10 MG tablet Take 1 tablet (10 mg total) by mouth at bedtime.    Marland Kitchen aspirin EC 81 MG EC tablet Take 1 tablet (81 mg total) by mouth daily. 30 tablet 0  . cetirizine (ZYRTEC) 10 MG tablet Take 10 mg by mouth daily as needed for allergies.    . cyclobenzaprine (FLEXERIL) 5 MG tablet As needed  0  . fluticasone (FLONASE) 50 MCG/ACT nasal spray SHAKE LIQUID AND USE 2 SPRAYS IN EACH NOSTRIL DAILY (Patient taking differently: SHAKE LIQUID AND USE 1-2 SPRAYS IN EACH NOSTRIL DAILY  AS NEEDED FOR ALLERGIES) 48 g 1  . gabapentin (NEURONTIN) 600 MG tablet Take 600 mg by mouth 4 (four) times daily.     . Insulin Human (INSULIN PUMP) SOLN Inject into the skin continuous. Use with Humalog - basal rate    . insulin NPH Human (HUMULIN N,NOVOLIN N) 100 UNIT/ML injection Inject 6-45 Units into the skin See admin instructions. Used withInsulin pump 35-45 units per day per basal rate...Marland KitchenMarland KitchenInject 6-12 units subcutaneously per sliding scale as needed for blood sugar correction with insulin pump    . meloxicam (MOBIC) 7.5 MG tablet As needed  0  . PAZEO 0.7 % SOLN Place 1 drop into both eyes daily as needed for allergies.  3  . pravastatin (PRAVACHOL) 20 MG tablet Take 20 mg by mouth daily.    . SUMAtriptan (IMITREX) 25  MG tablet Take 1 tablet (25 mg total) by mouth every 2 (two) hours as needed for migraine. May repeat in 2 hours if headache persists or recurs. 10 tablet 0  . traMADol (ULTRAM) 50 MG tablet Take 1 tablet (50 mg total) by mouth every 12 (twelve) hours as needed for severe pain. 6 tablet 0  . Vitamin D, Ergocalciferol, (DRISDOL) 50000 units CAPS capsule Take 50,000 Units by mouth every Monday.     No current facility-administered medications on file prior to visit.     BP 103/66 (BP Location: Right Arm, Patient Position: Sitting, Cuff Size: Normal)   Pulse 67   Temp 98.3 F (36.8 C) (Oral)   Resp 16   Ht 4\' 9"  (1.448 m)   Wt 110 lb 6.4 oz (50.1 kg)   SpO2 100%   BMI 23.89 kg/m       Objective:   Physical Exam  General Mental Status- Alert. General Appearance- Not in acute distress.   Skin General: Color- Normal Color. Moisture- Normal Moisture.  Chest and Lung Exam Auscultation: Breath Sounds:-Normal.  Cardiovascular Auscultation:Rythm- Regular. Murmurs & Other Heart Sounds:Auscultation of the heart reveals- No Murmurs.   Neurologic Cranial Nerve exam:- CN III-XII intact(No nystagmus), symmetric smile. Strength:- 5/5 equal and symmetric strength both upper and lower extremities.   Rt upper ext- lateral epicondyle pain to palpation. Some pain lateral epicondyle. On rom some proximal humerus pain.    Assessment & Plan:  For elbow pain will xray of elbow and continue meloxicam. Can start tomorrow. Toradol 60 mg im today.  Will refer to sports medicine for evaluation of lateral epicondylitis.(can or kristy or jennifer for upate)  Follow up date to be determined for above. But recommend getting cpe sometime in fall and can get flu vaccine that date.  Makinsley Schiavi, Percell Miller, PA-C

## 2016-11-25 NOTE — Addendum Note (Signed)
Addended by: Hinton Dyer on: 11/25/2016 02:00 PM   Modules accepted: Orders

## 2016-11-29 ENCOUNTER — Encounter: Payer: Self-pay | Admitting: Medical

## 2016-11-30 DIAGNOSIS — M7711 Lateral epicondylitis, right elbow: Secondary | ICD-10-CM | POA: Diagnosis not present

## 2016-12-03 ENCOUNTER — Telehealth: Payer: Self-pay | Admitting: Medical

## 2016-12-03 ENCOUNTER — Encounter: Payer: Self-pay | Admitting: Medical

## 2016-12-03 DIAGNOSIS — E785 Hyperlipidemia, unspecified: Secondary | ICD-10-CM | POA: Diagnosis not present

## 2016-12-03 DIAGNOSIS — E1042 Type 1 diabetes mellitus with diabetic polyneuropathy: Secondary | ICD-10-CM | POA: Diagnosis not present

## 2016-12-03 MED ORDER — SILVER SULFADIAZINE 1 % EX CREA: 1.0000 "application " | TOPICAL_CREAM | Freq: Two times a day (BID) | CUTANEOUS | 0 refills | Status: DC

## 2016-12-03 NOTE — Telephone Encounter (Signed)
Pt had oil burn cooking. She does not want to go to ED. States on forearm above wrist 1 inch by 3 inches.red but not blistered skin. Sent in silvadine. She has also washed and rinsed area. Advised make mychart for Monday 8 am. If area worsens as discussed then ED evaluation. Pt agreed.Also relative of her NP and will check area this weekend.

## 2016-12-03 NOTE — Telephone Encounter (Signed)
Opened to send rx silvadene to pharmacy.

## 2017-03-03 DIAGNOSIS — E1065 Type 1 diabetes mellitus with hyperglycemia: Secondary | ICD-10-CM | POA: Diagnosis not present

## 2017-03-03 DIAGNOSIS — Z79899 Other long term (current) drug therapy: Secondary | ICD-10-CM | POA: Diagnosis not present

## 2017-03-03 DIAGNOSIS — E782 Mixed hyperlipidemia: Secondary | ICD-10-CM | POA: Diagnosis not present

## 2017-03-03 DIAGNOSIS — E1041 Type 1 diabetes mellitus with diabetic mononeuropathy: Secondary | ICD-10-CM | POA: Diagnosis not present

## 2017-03-03 DIAGNOSIS — E559 Vitamin D deficiency, unspecified: Secondary | ICD-10-CM | POA: Diagnosis not present

## 2017-04-06 DIAGNOSIS — E1042 Type 1 diabetes mellitus with diabetic polyneuropathy: Secondary | ICD-10-CM | POA: Diagnosis not present

## 2017-05-31 DIAGNOSIS — M7711 Lateral epicondylitis, right elbow: Secondary | ICD-10-CM | POA: Diagnosis not present

## 2017-06-28 ENCOUNTER — Telehealth: Payer: Self-pay | Admitting: Medical

## 2017-06-28 NOTE — Telephone Encounter (Signed)
Pt declined her AWV. Pt states she receives this with her specialist.

## 2017-08-04 ENCOUNTER — Other Ambulatory Visit: Payer: Self-pay | Admitting: Medical

## 2017-08-04 DIAGNOSIS — Z1231 Encounter for screening mammogram for malignant neoplasm of breast: Secondary | ICD-10-CM

## 2017-08-10 ENCOUNTER — Encounter (HOSPITAL_BASED_OUTPATIENT_CLINIC_OR_DEPARTMENT_OTHER): Payer: Self-pay

## 2017-08-10 ENCOUNTER — Ambulatory Visit (HOSPITAL_BASED_OUTPATIENT_CLINIC_OR_DEPARTMENT_OTHER)
Admission: RE | Admit: 2017-08-10 | Discharge: 2017-08-10 | Disposition: A | Discharge status: Home / Self Care | Payer: Medicare Other | Source: Ambulatory Visit | Attending: Medical | Admitting: Medical

## 2017-08-10 DIAGNOSIS — E1042 Type 1 diabetes mellitus with diabetic polyneuropathy: Secondary | ICD-10-CM | POA: Diagnosis not present

## 2017-08-10 DIAGNOSIS — Z1231 Encounter for screening mammogram for malignant neoplasm of breast: Secondary | ICD-10-CM | POA: Diagnosis not present

## 2017-08-10 DIAGNOSIS — Z794 Long term (current) use of insulin: Secondary | ICD-10-CM | POA: Diagnosis not present

## 2017-10-25 ENCOUNTER — Emergency Department (HOSPITAL_BASED_OUTPATIENT_CLINIC_OR_DEPARTMENT_OTHER): Payer: Medicare Other

## 2017-10-25 ENCOUNTER — Encounter (HOSPITAL_BASED_OUTPATIENT_CLINIC_OR_DEPARTMENT_OTHER): Payer: Self-pay | Admitting: Adult Health

## 2017-10-25 ENCOUNTER — Other Ambulatory Visit: Payer: Self-pay

## 2017-10-25 ENCOUNTER — Emergency Department (HOSPITAL_BASED_OUTPATIENT_CLINIC_OR_DEPARTMENT_OTHER)
Admission: EM | Admit: 2017-10-25 | Discharge: 2017-10-25 | Disposition: A | Discharge status: Home / Self Care | Payer: Medicare Other | Attending: Emergency Medicine | Admitting: Emergency Medicine

## 2017-10-25 DIAGNOSIS — Z794 Long term (current) use of insulin: Secondary | ICD-10-CM | POA: Diagnosis not present

## 2017-10-25 DIAGNOSIS — M791 Myalgia, unspecified site: Secondary | ICD-10-CM | POA: Diagnosis not present

## 2017-10-25 DIAGNOSIS — Z8673 Personal history of transient ischemic attack (TIA), and cerebral infarction without residual deficits: Secondary | ICD-10-CM | POA: Insufficient documentation

## 2017-10-25 DIAGNOSIS — Z3202 Encounter for pregnancy test, result negative: Secondary | ICD-10-CM | POA: Insufficient documentation

## 2017-10-25 DIAGNOSIS — M7918 Myalgia, other site: Secondary | ICD-10-CM | POA: Diagnosis not present

## 2017-10-25 DIAGNOSIS — I10 Essential (primary) hypertension: Secondary | ICD-10-CM | POA: Insufficient documentation

## 2017-10-25 DIAGNOSIS — R5381 Other malaise: Secondary | ICD-10-CM

## 2017-10-25 DIAGNOSIS — E1065 Type 1 diabetes mellitus with hyperglycemia: Secondary | ICD-10-CM | POA: Insufficient documentation

## 2017-10-25 DIAGNOSIS — Z79899 Other long term (current) drug therapy: Secondary | ICD-10-CM | POA: Insufficient documentation

## 2017-10-25 DIAGNOSIS — Z7982 Long term (current) use of aspirin: Secondary | ICD-10-CM | POA: Insufficient documentation

## 2017-10-25 DIAGNOSIS — R5383 Other fatigue: Secondary | ICD-10-CM | POA: Insufficient documentation

## 2017-10-25 DIAGNOSIS — R079 Chest pain, unspecified: Secondary | ICD-10-CM | POA: Diagnosis not present

## 2017-10-25 LAB — URINALYSIS, ROUTINE W REFLEX MICROSCOPIC
BILIRUBIN URINE: NEGATIVE
Glucose, UA: >=500 mg/dL — AB
Hgb urine dipstick: NEGATIVE
LEUKOCYTES UA: NEGATIVE
NITRITE: NEGATIVE
Protein, ur: NEGATIVE mg/dL
SPECIFIC GRAVITY, URINE: 1.02 (ref 1.005–1.030)
pH: 7.5 (ref 5.0–8.0)

## 2017-10-25 LAB — BASIC METABOLIC PANEL
Anion gap: 8 (ref 5–15)
BUN: 13 mg/dL (ref 6–20)
CALCIUM: 8.4 mg/dL — AB (ref 8.9–10.3)
CO2: 24 mmol/L (ref 22–32)
Chloride: 104 mmol/L (ref 101–111)
Creatinine, Ser: 0.65 mg/dL (ref 0.44–1.00)
Glucose, Bld: 316 mg/dL — ABNORMAL HIGH (ref 65–99)
POTASSIUM: 3.6 mmol/L (ref 3.5–5.1)
SODIUM: 136 mmol/L (ref 135–145)

## 2017-10-25 LAB — HEPATIC FUNCTION PANEL
ALBUMIN: 3.7 g/dL (ref 3.5–5.0)
ALT: 11 U/L — ABNORMAL LOW (ref 14–54)
AST: 21 U/L (ref 15–41)
Alkaline Phosphatase: 44 U/L (ref 38–126)
Bilirubin, Direct: 0.1 mg/dL (ref 0.1–0.5)
Indirect Bilirubin: 0.6 mg/dL (ref 0.3–0.9)
Total Bilirubin: 0.7 mg/dL (ref 0.3–1.2)
Total Protein: 7 g/dL (ref 6.5–8.1)

## 2017-10-25 LAB — CBC
HCT: 38.3 % (ref 36.0–46.0)
Hemoglobin: 13 g/dL (ref 12.0–15.0)
MCH: 31.9 pg (ref 26.0–34.0)
MCHC: 33.9 g/dL (ref 30.0–36.0)
MCV: 93.9 fL (ref 78.0–100.0)
Platelets: 300 10*3/uL (ref 150–400)
RBC: 4.08 MIL/uL (ref 3.87–5.11)
RDW: 11.4 % — ABNORMAL LOW (ref 11.5–15.5)
WBC: 10.8 10*3/uL — AB (ref 4.0–10.5)

## 2017-10-25 LAB — CBG MONITORING, ED
GLUCOSE-CAPILLARY: 330 mg/dL — AB (ref 65–99)
GLUCOSE-CAPILLARY: 242 mg/dL — AB (ref 65–99)

## 2017-10-25 LAB — D-DIMER, QUANTITATIVE (NOT AT ARMC): D DIMER QUANT: 0.41 ug{FEU}/mL (ref 0.00–0.50)

## 2017-10-25 LAB — LIPASE, BLOOD: LIPASE: 31 U/L (ref 11–51)

## 2017-10-25 LAB — URINALYSIS, MICROSCOPIC (REFLEX): RBC / HPF: NONE SEEN RBC/hpf (ref 0–5)

## 2017-10-25 LAB — TROPONIN I

## 2017-10-25 LAB — PREGNANCY, URINE: Preg Test, Ur: NEGATIVE

## 2017-10-25 MED ORDER — SODIUM CHLORIDE 0.9 % IV BOLUS
1000.0000 mL | Freq: Once | INTRAVENOUS | Status: AC
  Administered 2017-10-25: 1000 mL via INTRAVENOUS

## 2017-10-25 NOTE — ED Notes (Signed)
ED Provider at bedside. 

## 2017-10-25 NOTE — ED Triage Notes (Signed)
Type 1 diabetic present with high blood sugar, not feeling well, numbness tingling, fatigue, chest pressure and "I feel like I am going to slip into a diabetic coma again"  SHe has an insulin pump. THis all began Wednesday.

## 2017-10-25 NOTE — ED Provider Notes (Addendum)
[facility] Provider Note   CSN: [id number] Arrival date & time: 10/25/17  1607     History   Chief Complaint Chief Complaint  Patient presents with  . Hyperglycemia    HPI Lisa Levine is a 42 y.o. female.  HPI Patient reports that she felt really bad 6 days ago.  First symptom was generally feeling extremely fatigued and worn out.  She did not have other very specific symptoms that day.  She did try to continue to stay active.  She reports however she was coming home from work and activities with severe fatigue and having to rest a lot.  She then developed a lot of aching pain in her feet and her hands.  She reports she has neuropathy and that does occur.  She tried to keep up with taking her Neurontin.  She reports that she then went on to get more diffuse joint pains.  They were sharp in nature.  She reports something similar happened a couple of years ago but then resolved.  She does not have any specific arthropathy diagnosis.  She reports by Friday things seem to be improved somewhat.  Over the weekend however, she started to feel worse again and her sugar spiked up to greater than 500.  She continued to use her insulin pump and also gave herself additional subcu insulin.  She was able to get the blood sugar to trend back down and come under 300.  She reports she was feeling very nauseated when it was elevated.  She did not go on to have recurrent vomiting.  She was trying to drink and stay hydrated.  She reports on Monday she had a feeling in her chest.  That ultimately resolved spontaneously.  She continued with normal activities.  Today, after work patient reports she felt extremely fatigued and was worried that she might develop a diabetic coma again. Past Medical History:  Diagnosis Date  . Allergy   . Arthritis   . DVT (deep venous thrombosis) ([place])    "often on my LLE" (06/04/2016)  . Hyperlipidemia   . Hypertension   . Migraine    "q couple  months" (06/04/2016)  . Peripheral neuropathy   . Thyroid disease   . Type 1 diabetes mellitus ([place])   . Vitamin D deficiency     Patient Active Problem List   Diagnosis Date Noted  . 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 TIA (transient ischemic attack) 10/27/2016  . Hypokalemia 10/27/2016  . Normocytic normochromic anemia 10/27/2016  . Bursitis 10/27/2016  . Migraine 06/05/2016  . Syncope 06/04/2016  . Sinusitis, acute maxillary 05/22/2015  . Allergic rhinitis 02/14/2015  . Diabetic neuropathy ([place]) 02/14/2015  . Hyperlipidemia 02/14/2015  . Low serum vitamin D 02/14/2015  . Hyperthyroidism 02/14/2015  . Skin lesion 02/14/2015  . IDDM (insulin dependent diabetes mellitus) ([place]) 02/14/2015    Past Surgical History:  Procedure Laterality Date  . insulin pump  2005-present   "upgrades q 3 yrs" (06/04/2016)     OB History   None      Home Medications    Prior to Admission medications   Medication Sig Start Date End Date Taking? Authorizing Provider  acetaminophen (TYLENOL) 500 MG tablet Take 500 mg by mouth 3 (three) times daily as needed for mild pain.    [provider]  amitriptyline (ELAVIL) 10 MG tablet Take 1 tablet (10 mg total) by mouth at bedtime. 10/27/16   Geradine Girt, DO  aspirin EC 81 MG EC tablet Take 1 tablet (81 mg total) by mouth daily. 10/27/16   Geradine Girt, DO  cetirizine (ZYRTEC) 10 MG tablet Take 10 mg by mouth daily as needed for allergies.    [provider]  cyclobenzaprine (FLEXERIL) 5 MG tablet As needed 07/27/16   [provider]  fluticasone (FLONASE) 50 MCG/ACT nasal spray SHAKE LIQUID AND USE 2 SPRAYS IN EACH NOSTRIL DAILY Patient taking differently: SHAKE LIQUID AND USE 1-2 SPRAYS IN EACH NOSTRIL DAILY  AS NEEDED FOR ALLERGIES 09/20/16   Saguier, Percell Miller, PA-C  gabapentin (NEURONTIN) 600 MG tablet Take 600 mg by mouth 4 (four) times daily.     [provider]  Insulin Human (INSULIN PUMP) SOLN  Inject into the skin continuous. Use with Humalog - basal rate    [provider]  insulin NPH Human (HUMULIN N,NOVOLIN N) 100 UNIT/ML injection Inject 6-45 Units into the skin See admin instructions. Used withInsulin pump 35-45 units per day per basal rate...Marland KitchenMarland KitchenInject 6-12 units subcutaneously per sliding scale as needed for blood sugar correction with insulin pump    [provider]  meloxicam (MOBIC) 7.5 MG tablet As needed 07/27/16   [provider]  PAZEO 0.7 % SOLN Place 1 drop into both eyes daily as needed for allergies. 09/10/16   [provider]  pravastatin (PRAVACHOL) 20 MG tablet Take 20 mg by mouth daily.    [provider]  silver sulfADIAZINE (SILVADENE) 1 % cream Apply 1 application topically 2 (two) times daily. 12/03/16   Saguier, Percell Miller, PA-C  SUMAtriptan (IMITREX) 25 MG tablet Take 1 tablet (25 mg total) by mouth every 2 (two) hours as needed for migraine. May repeat in 2 hours if headache persists or recurs. 06/05/16   Elwin Mocha, MD  traMADol (ULTRAM) 50 MG tablet Take 1 tablet (50 mg total) by mouth every 12 (twelve) hours as needed for severe pain. 07/27/16   Saguier, Percell Miller, PA-C  Vitamin D, Ergocalciferol, (DRISDOL) 50000 units CAPS capsule Take 50,000 Units by mouth every Monday.    [provider]  Vitamin D, Ergocalciferol, (DRISDOL) 50000 units CAPS capsule Take 1 capsule (50,000 Units total) by mouth every 7 (seven) days. 11/25/16   Saguier, Percell Miller, PA-C    Family History Family History  Problem Relation Age of Onset  . Breast cancer Mother   . Hyperlipidemia Father   . Hypertension Father   . Colon cancer Paternal Grandfather   . Lung cancer Paternal Uncle        smoler  . Liver disease Paternal Uncle     Social History Social History   Tobacco Use  . Smoking status: Never Smoker  . Smokeless tobacco: Never Used  Substance Use Topics  . Alcohol use: No  . Drug use: No     Allergies   Patient has no  known allergies.   Review of Systems Review of Systems 10 Systems reviewed and are negative for acute change except as noted in the HPI.   Physical Exam Updated Vital Signs BP 108/67   Pulse 74   Temp 98.2 F (36.8 C) (Oral)   Resp 16   Ht 4' 10.5" (1.486 m)   Wt 47.6 kg (105 lb)   SpO2 98%   BMI 21.57 kg/m   Physical Exam  Constitutional: She is oriented to person, place, and time. She appears well-developed and well-nourished.  Has clinically well appearance.  Nontoxic.  No respiratory distress.  Well-nourished well-developed.  HENT:  Head: Normocephalic and atraumatic.  Nose: Nose normal.  Mouth/Throat: Oropharynx is clear and moist.  Eyes: Pupils are equal, round, and reactive to light. EOM are normal.  Neck: Neck supple.  Cardiovascular: Normal rate, regular rhythm, normal heart sounds and intact distal pulses.  Pulmonary/Chest: Effort normal and breath sounds normal.  Abdominal: Soft. Bowel sounds are normal. She exhibits no distension. There is no tenderness. There is no guarding.  Musculoskeletal: Normal range of motion. She exhibits no edema or tenderness.  Condition of feet and legs is excellent.  No wounds no swelling no skin lesions.  Neurological: She is alert and oriented to person, place, and time. She has normal strength. She exhibits normal muscle tone. Coordination normal. GCS eye subscore is 4. GCS verbal subscore is 5. GCS motor subscore is 6.  Skin: Skin is warm, dry and intact. No rash noted.  Psychiatric: She has a normal mood and affect.     ED Treatments / Results  Labs (all labs ordered are listed, but only abnormal results are displayed) Labs Reviewed  BASIC METABOLIC PANEL - Abnormal; Notable for the following components:      Result Value   Glucose, Bld 316 (*)    Calcium 8.4 (*)    All other components within normal limits  CBC - Abnormal; Notable for the following components:   WBC 10.8 (*)    RDW 11.4 (*)    All other components  within normal limits  URINALYSIS, ROUTINE W REFLEX MICROSCOPIC - Abnormal; Notable for the following components:   Glucose, UA >=500 (*)    Ketones, ur >80 (*)    All other components within normal limits  HEPATIC FUNCTION PANEL - Abnormal; Notable for the following components:   ALT 11 (*)    All other components within normal limits  URINALYSIS, MICROSCOPIC (REFLEX) - Abnormal; Notable for the following components:   Bacteria, UA MANY (*)    All other components within normal limits  CBG MONITORING, ED - Abnormal; Notable for the following components:   Glucose-Capillary 330 (*)    All other components within normal limits  CBG MONITORING, ED - Abnormal; Notable for the following components:   Glucose-Capillary 242 (*)    All other components within normal limits  PREGNANCY, URINE  LIPASE, BLOOD  TROPONIN I  D-DIMER, QUANTITATIVE (NOT AT [facility])    EKG EKG Interpretation  Date/Time:  Tuesday October 25 2017 16:19:20 EDT Ventricular Rate:  89 PR Interval:  122 QRS Duration: 82 QT Interval:  364 QTC Calculation: 442 R Axis:   89 Text Interpretation:  Normal sinus rhythm Nonspecific T wave abnormality Abnormal ECG no change from previous Confirmed by Charlesetta Shanks [phone]) on 10/25/2017 6:16:04 PM   Radiology Dg Chest 2 View  Result Date: 10/25/2017 CLINICAL DATA:  Chest pain EXAM: CHEST - 2 VIEW COMPARISON:  09/29/2015 chest radiograph. FINDINGS: Stable cardiomediastinal silhouette with normal heart size. No pneumothorax. No pleural effusion. Lungs appear clear, with no acute consolidative airspace disease and no pulmonary edema. IMPRESSION: No active cardiopulmonary disease. Electronically Signed   By: Ilona Sorrel M.D.   On: 10/25/2017 19:20    Procedures Procedures (including critical care time) No critical care time Medications Ordered in ED Medications  sodium chloride 0.9 % bolus 1,000 mL (0 mLs Intravenous Stopped 10/25/17 1922)     Initial Impression / Assessment and  Plan / ED Course  I have reviewed the triage vital signs and the nursing notes.  Pertinent labs & imaging results that  were available during my care of the patient were reviewed by me and considered in my medical decision making (see chart for details).      Final Clinical Impressions(s) / ED Diagnoses   Final diagnoses:  Hyperglycemia due to type 1 diabetes mellitus ([place])  Malaise and fatigue  Myalgia   Patient is a type I diabetic who has had elevated blood sugar readings over the past few days.  She has had generalized constitutional symptoms of malaise, arthralgia and fatigue.  At this time, patient's clinical appearance is good.  She is alert and nontoxic.  Review of systems and diagnostic work-up does not identify a focus of infection.  EKG, cardiac enzymes and d-dimer do not suggest cardiopulmonary etiology.  Patient has no signs of acidosis, no anion gap.  Patient seems very well educated in managing her blood sugar at home and signs and symptoms for which to return.  Patient is currently stable for discharge.  Return precautions reviewed.  Patient will call her endocrinologist tomorrow to schedule close follow-up. ED Discharge Orders    None       Charlesetta Shanks, MD 10/25/17 2009    Charlesetta Shanks, MD 10/25/17 2010

## 2017-10-25 NOTE — Discharge Instructions (Addendum)
Call your endocrinologist tomorrow to schedule a recheck in 1-2 days. Return to the ER if your symptoms worsen, new symptoms develop, or you cannot control your blood sugar.

## 2017-10-25 NOTE — ED Notes (Signed)
Pt reports her pump has been regulating per the high reading of her CBG but continues to have readings above 300 x 2 days. Pt took 6.8units insulin approx 315 for extra coverage. Pt reports feeling fatigued and joint pain all over with "needle sensations"

## 2017-11-05 IMAGING — MR MR HEAD WO/W CM
16 of 23 series · 23 of 48 positions shown · IV contrast (multihance)
Comparison: 1. Head CT 10/27/2016
[DATE]. Brain MRI 06/05/2016

CLINICAL DATA: History of migraine headaches. Acute onset numbness
and tingling of the right hand.

EXAM:
MRI HEAD WITHOUT AND WITH CONTRAST
MRI CERVICAL SPINE WITHOUT AND WITH CONTRAST
TECHNIQUE: Multiplanar, multiecho pulse sequences of the brain and surrounding
structures, and cervical spine, to include the craniocervical
junction and cervicothoracic junction, were obtained without and
with intravenous contrast.
CONTRAST:  10mL MULTIHANCE GADOBENATE DIMEGLUMINE 529 MG/ML IV SOLN

[Series 3: FLAIR · sagittal · 5.0mm · 0.47mm/px · 1 of 23 slices shown (1 of 4)]
[im 1/23]
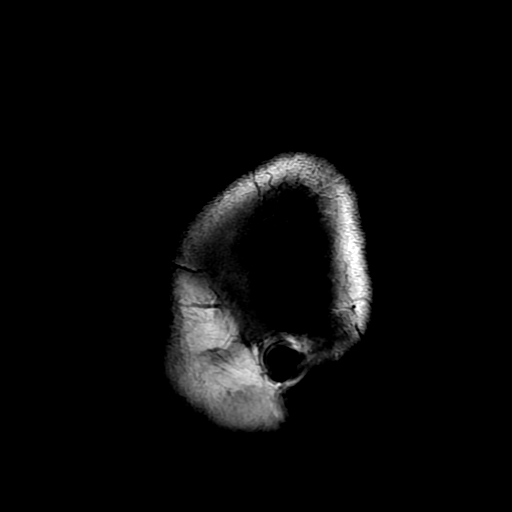

[Series 5: DWI · axial · 3.0mm · 0.94mm/px · z∈[-143,-3]mm · 2 of 99 slices shown (1 of 2)]
[im 1/99]
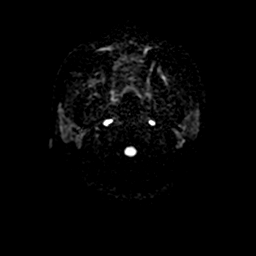
[im 99/99]
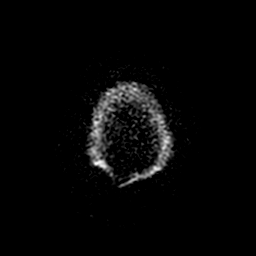

[Series 7: T2 · axial · 5.0mm · 0.47mm/px · 1 of 27 slices shown (1 of 3)]
[im 1/27]
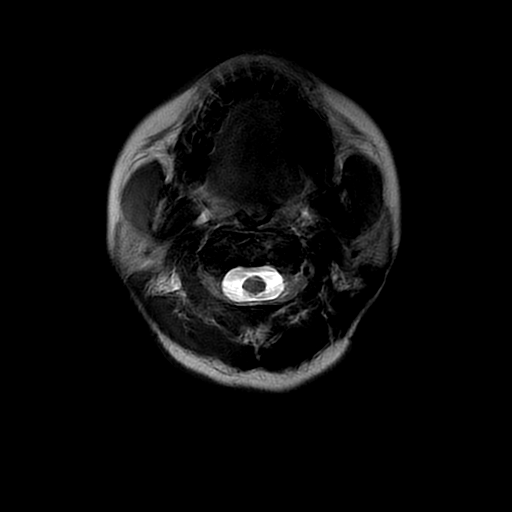

[Series 8: FLAIR · axial · 5.0mm · 0.47mm/px · 1 of 27 slices shown (2 of 4)]
[im 1/27]
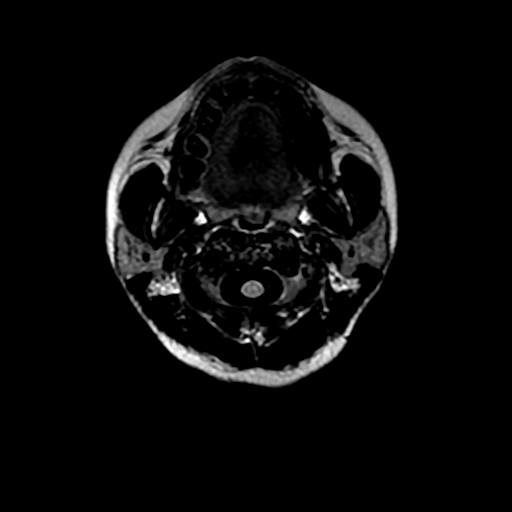

[Series 9: DWI · coronal · 4.0mm · 0.94mm/px · 1 of 63 slices shown (2 of 2)]
[im 1/63]
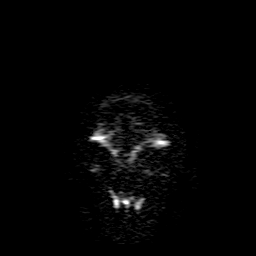

[Series 13: T2 · sagittal · 3.0mm · 0.39mm/px · 1 of 14 slices shown (2 of 3)]
[im 1/14]
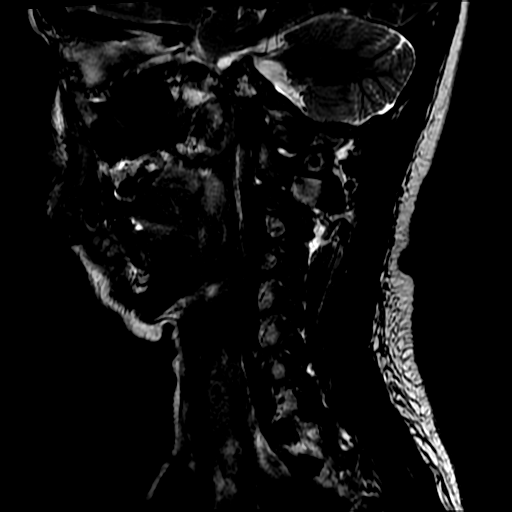

[Series 14: FLAIR · sagittal · 3.0mm · 0.39mm/px · 1 of 14 slices shown (3 of 4)]
[im 1/14]
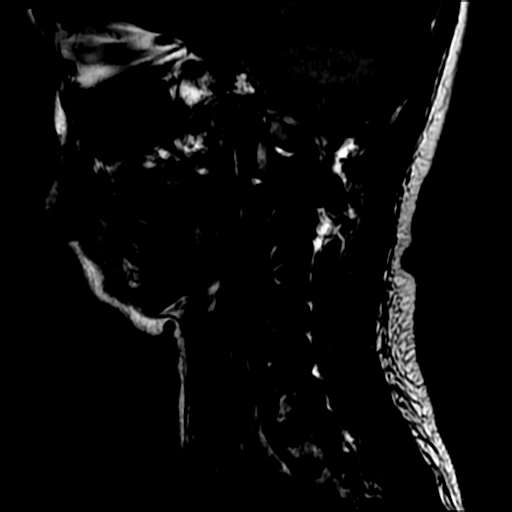

[Series 17: T2 · axial · 3.0mm · 0.35mm/px · 1 of 29 slices shown (3 of 3)]
[im 1/29]
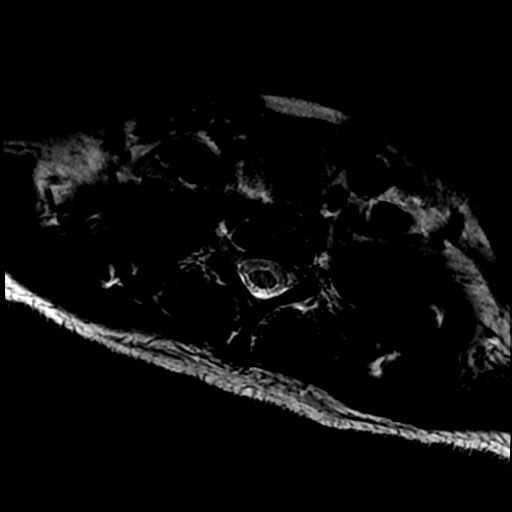

[Series 18: T1 · axial · non-contrast · 3.0mm · 0.35mm/px · 1 of 29 slices shown (1 of 2)]
[im 1/29]
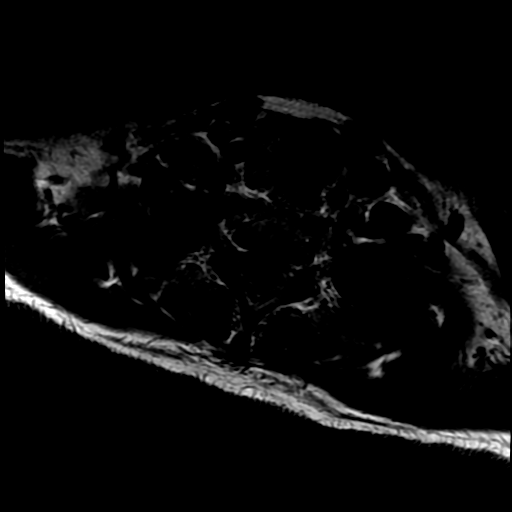

[Series 19: FLAIR · sagittal · 1.6mm · 0.49mm/px · 6 of 168 slices shown (4 of 4)]
[im 1/168]
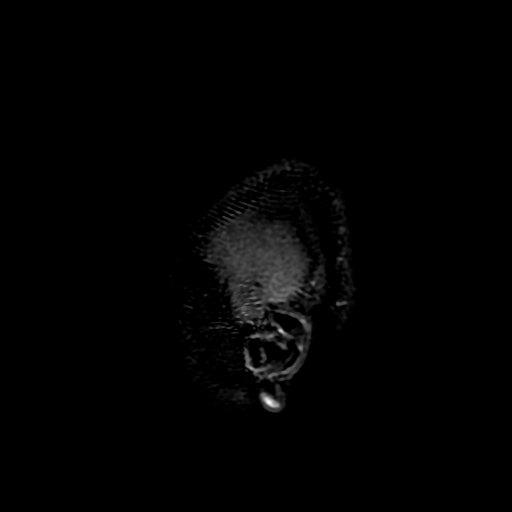
[im 34/168]
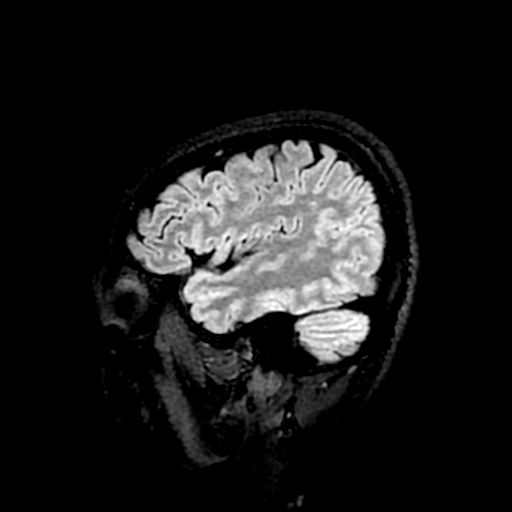
[im 67/168]
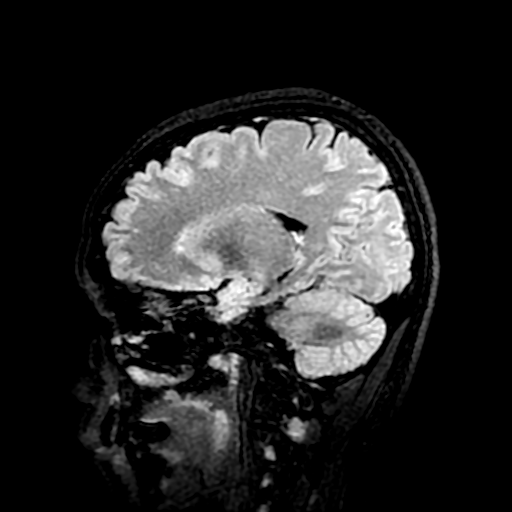
[im 101/168]
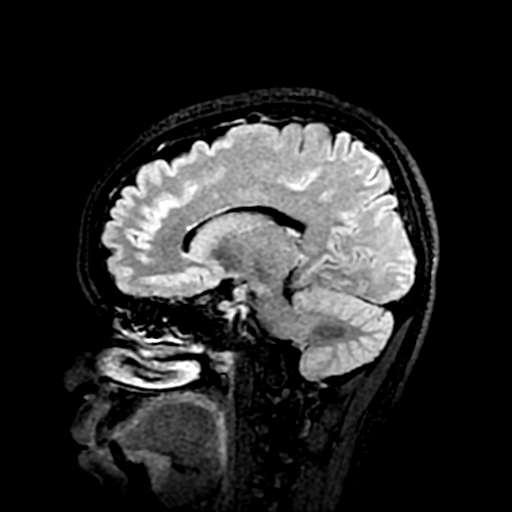
[im 134/168]
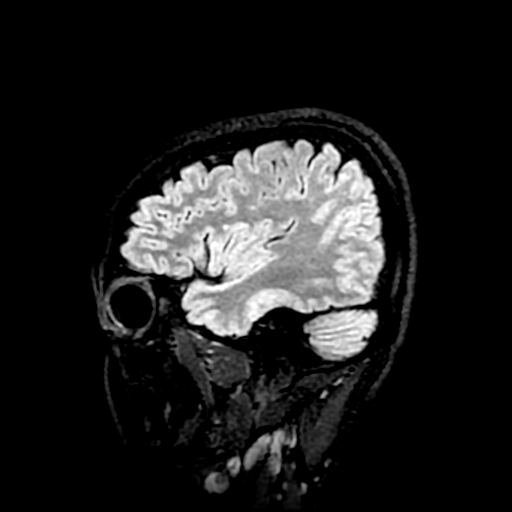
[im 168/168]
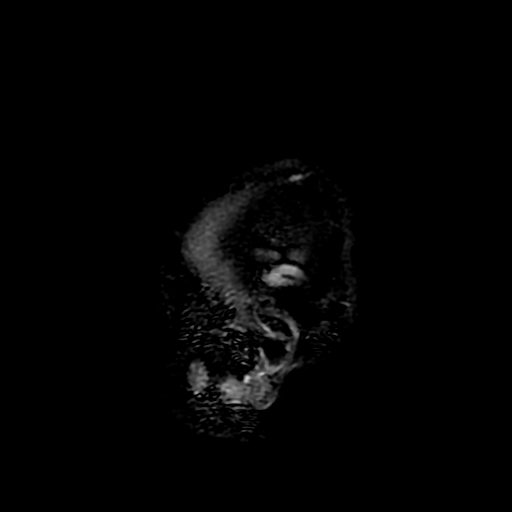

[Series 20: T2 post-contrast · coronal · 5.0mm · 0.39mm/px · 1 of 26 slices shown]
[im 1/26]
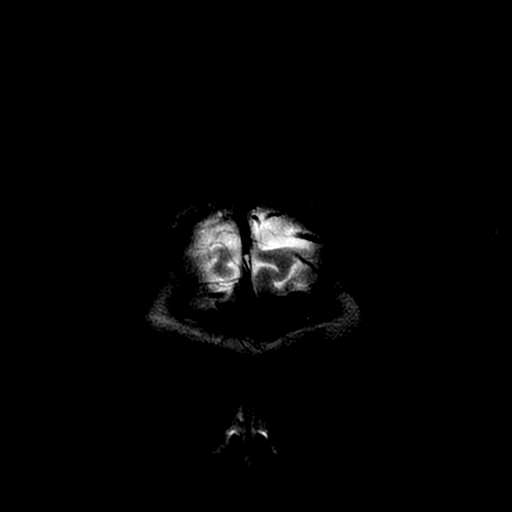

[Series 22: T1 · coronal · 5.0mm · 0.39mm/px · 1 of 27 slices shown (2 of 2)]
[im 1/27]
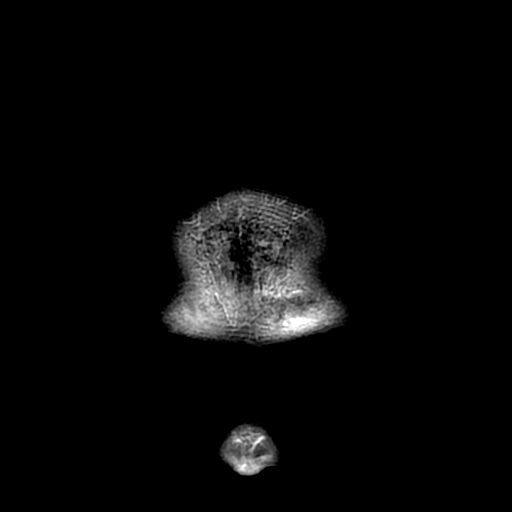

[Series 23: T1 fat-sat post-contrast · sagittal · 3.0mm · 0.39mm/px · 1 of 14 slices shown]
[im 1/14]
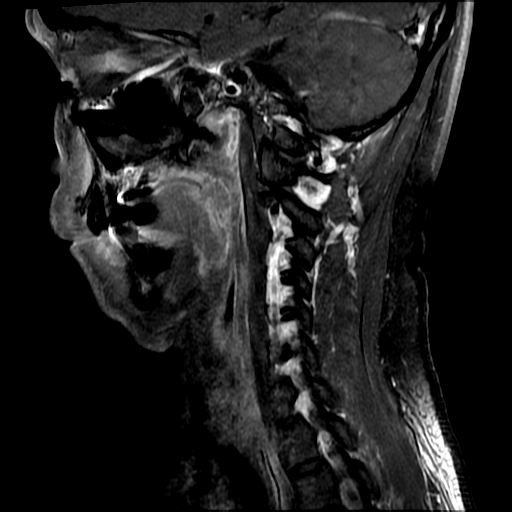

[Series 24: T1 post-contrast · axial · 3.0mm · 0.35mm/px · 1 of 29 slices shown]
[im 1/29]
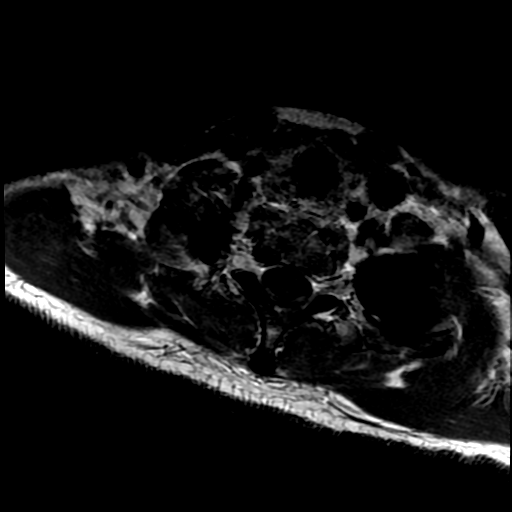

[Series 550: ADC · axial · 3.0mm · 0.94mm/px · z∈[-143,-3]mm · 2 of 50 slices shown (1 of 2)]
[im 1/50]
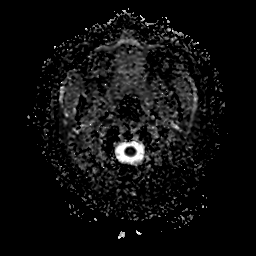
[im 50/50]
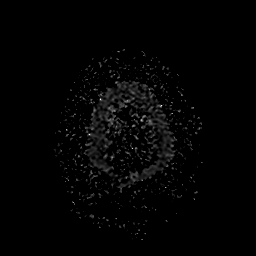

[Series 950: ADC · coronal · 4.0mm · 0.94mm/px · 1 of 32 slices shown (2 of 2)]
[im 1/32]
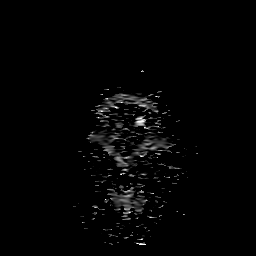

[23 of 48 positions shown; findings below may reference images not displayed]

FINDINGS: MRI HEAD FINDINGS

Brain: The midline structures are normal. There is no focal
diffusion restriction to indicate acute infarct. Scattered
nonspecific foci of hyperintense T2 weighted signal in the
subcortical and deep white matter are unchanged. No intraparenchymal
hematoma or chronic microhemorrhage. Brain volume is normal for age
without age-advanced or lobar predominant atrophy. The dura is
normal and there is no extra-axial collection. No contrast-enhancing
lesions.

Vascular: Major intracranial arterial and venous sinus flow voids
are preserved.

Skull and upper cervical spine: The visualized skull base,
calvarium, upper cervical spine and extracranial soft tissues are
normal.

Sinuses/Orbits: No fluid levels or advanced mucosal thickening. No
mastoid effusion. Normal orbits.

MRI CERVICAL SPINE FINDINGS

Alignment: Normal

Vertebrae: Normal

Cord: Normal signal and caliber.

Posterior Fossa, vertebral arteries, paraspinal tissues: Normal

Disc levels: No cervical spinal canal stenosis or neural foraminal
stenosis.
IMPRESSION: 1. Unchanged appearance of multiple nonspecific white matter
hyperintensities in the brain. Differential considerations remain
broad, including migraine headaches, chronic microangiopathy,
demyelinating disease or sequelae of remote infection/inflammation.
No contrast-enhancing lesions.
2. Normal MRI of the cervical spine.

## 2017-12-04 IMAGING — DX DG ELBOW COMPLETE 3+V*R*
4 series · 4 of 4 positions shown · non-contrast
Comparison: No recent .

CLINICAL DATA: Elbow pain.  No injury.

EXAM:
RIGHT ELBOW - COMPLETE 3+ VIEW

[elbow ap]
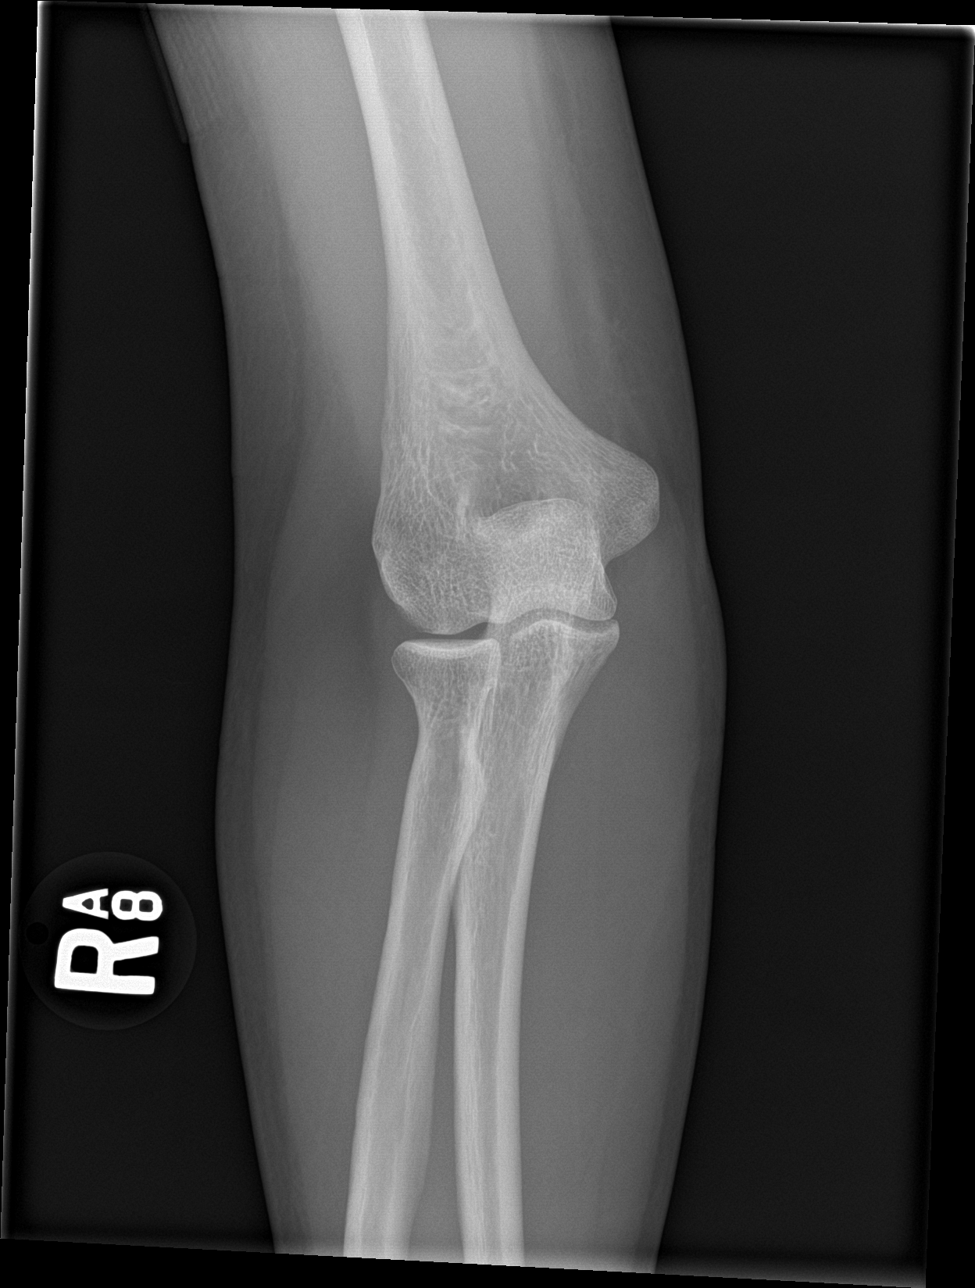

[elbow obl (1 of 2)]
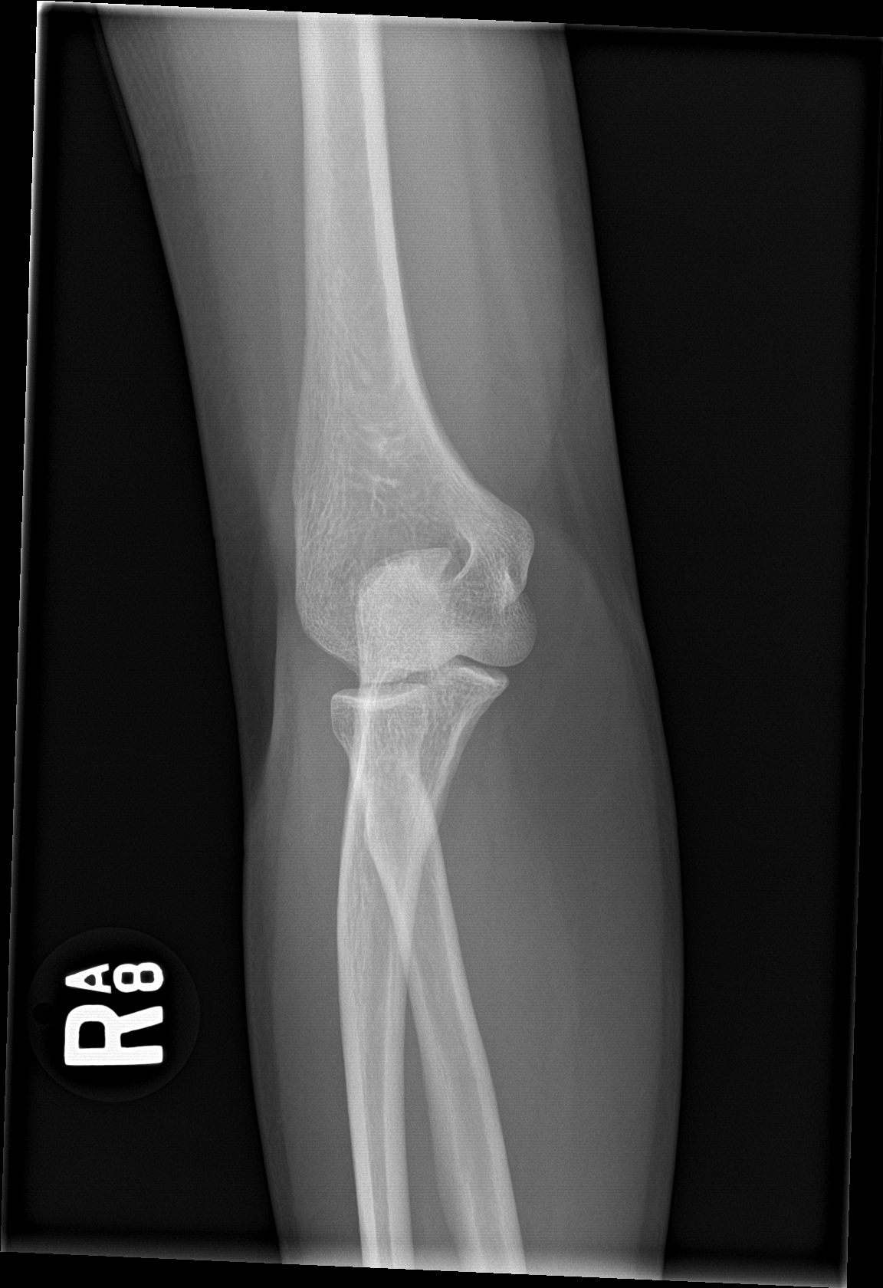

[elbow obl (2 of 2)]
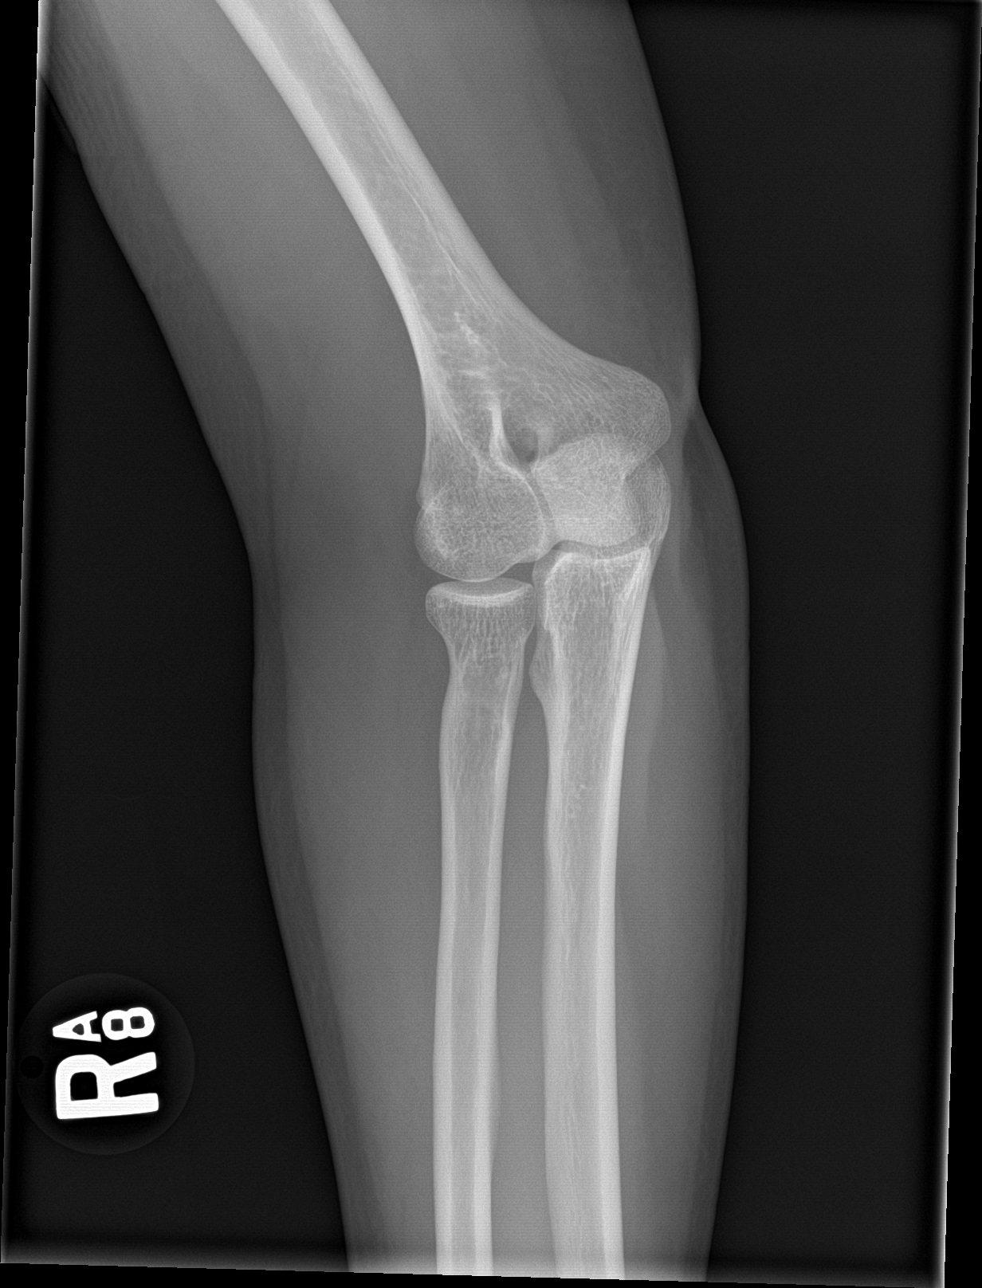

[elbow lat]
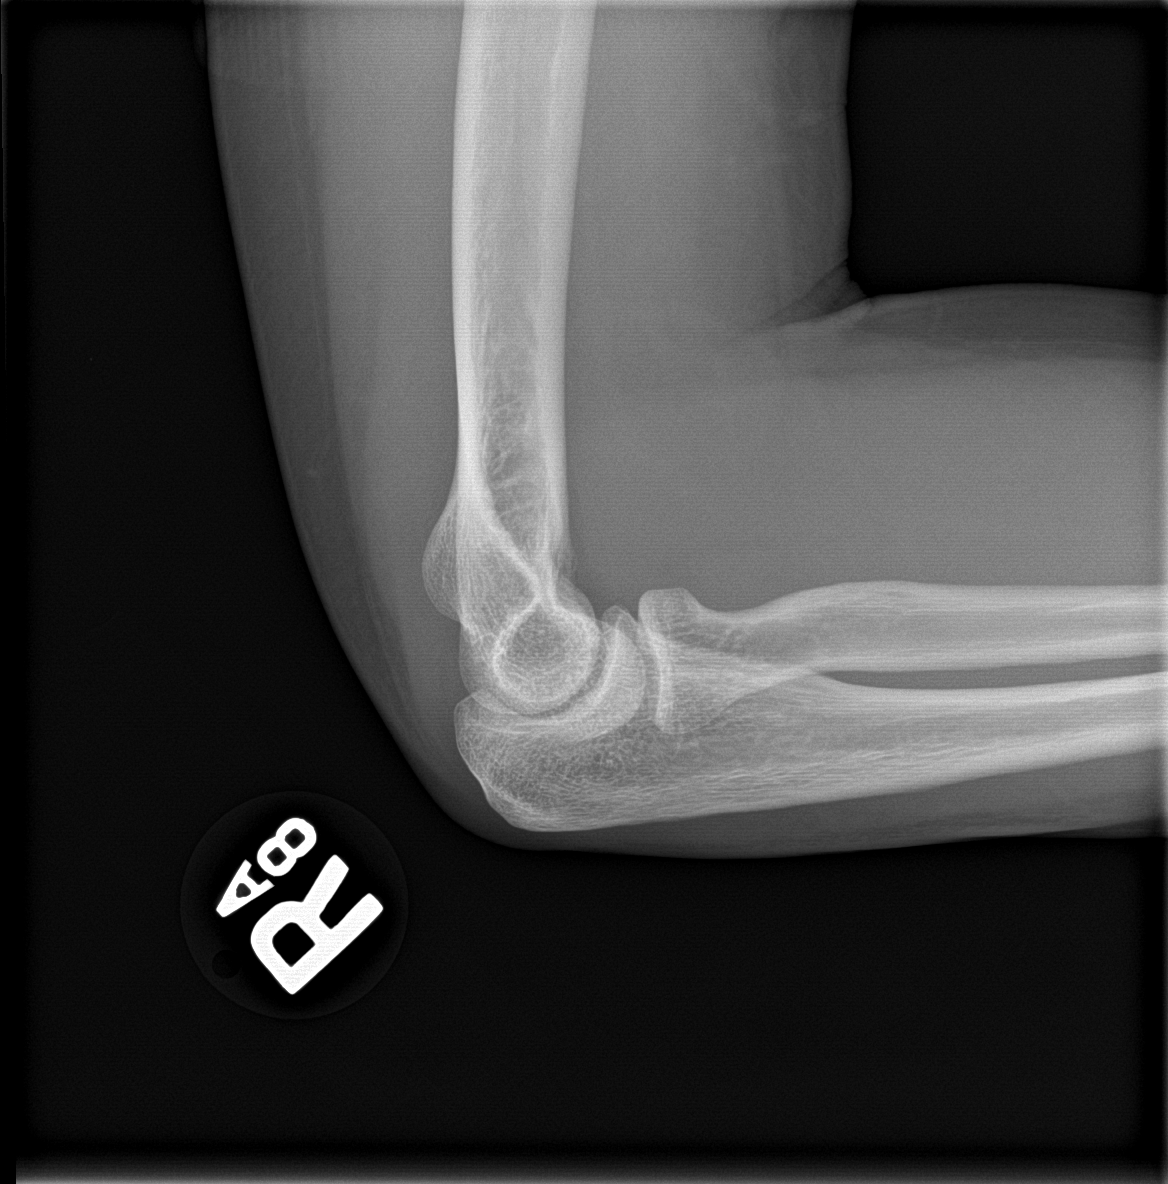

[4 of 4 positions shown; findings below may reference images not displayed]

FINDINGS: No evidence of effusion.  No fracture or dislocation.
IMPRESSION: No acute abnormality.

## 2017-12-06 DIAGNOSIS — M7711 Lateral epicondylitis, right elbow: Secondary | ICD-10-CM | POA: Diagnosis not present

## 2017-12-07 DIAGNOSIS — E1042 Type 1 diabetes mellitus with diabetic polyneuropathy: Secondary | ICD-10-CM | POA: Diagnosis not present

## 2017-12-07 DIAGNOSIS — G43109 Migraine with aura, not intractable, without status migrainosus: Secondary | ICD-10-CM | POA: Diagnosis not present

## 2017-12-07 DIAGNOSIS — M79641 Pain in right hand: Secondary | ICD-10-CM | POA: Diagnosis not present

## 2017-12-07 DIAGNOSIS — M79671 Pain in right foot: Secondary | ICD-10-CM | POA: Diagnosis not present

## 2017-12-07 DIAGNOSIS — M79672 Pain in left foot: Secondary | ICD-10-CM | POA: Diagnosis not present

## 2017-12-08 ENCOUNTER — Ambulatory Visit (INDEPENDENT_AMBULATORY_CARE_PROVIDER_SITE_OTHER): Payer: Medicare Other | Admitting: Medical

## 2017-12-08 ENCOUNTER — Encounter: Payer: Self-pay | Admitting: Medical

## 2017-12-08 VITALS — BP 119/68 | HR 71 | Temp 98.6°F | Resp 16 | Ht <= 58 in | Wt 114.0 lb

## 2017-12-08 DIAGNOSIS — H60502 Unspecified acute noninfective otitis externa, left ear: Secondary | ICD-10-CM

## 2017-12-08 DIAGNOSIS — R7989 Other specified abnormal findings of blood chemistry: Secondary | ICD-10-CM

## 2017-12-08 NOTE — Progress Notes (Signed)
Subjective:    Patient ID: Lisa Levine, female    DOB: [birth year], 42 y.o.   MRN: [id number]  HPI  Pt in for evaluation. Pt gives me update that she saw neurologist. She has better understanding of her neuropathy now.  Pt states 2 days ago she got ear ache. When she turns her head left side occipital area scalp mild tender. No recent congestion. Pt states yesterday when pressing on ear it was hurting. She demonstrates pressing on tragus yesterday hurt. Pt last 2 days sugar was 300. But today 165. Pt pump went to 76 units yesterday. She did get steroid injection on Tuesday for tennis elbow.  Pt states no history of ear infection.   Pt also has scalp tenderness left side. No associate neurologic type symptoms. No migraine ha recently.     Review of Systems  Respiratory: Negative for cough, chest tightness, shortness of breath and wheezing.   Cardiovascular: Negative for chest pain and palpitations.  Gastrointestinal: Negative for abdominal distention and anal bleeding.  Musculoskeletal: Negative for back pain.  Skin: Negative for rash.  Neurological: Negative for dizziness, seizures, syncope, speech difficulty, weakness and light-headedness.  Hematological: Negative for adenopathy. Does not bruise/bleed easily.  Psychiatric/Behavioral: Negative for agitation and confusion. The patient is not nervous/anxious.    Past Medical History:  Diagnosis Date  . Allergy   . Arthritis   . DVT (deep venous thrombosis) ([place])    "often on my LLE" (06/04/2016)  . Hyperlipidemia   . Hypertension   . Migraine    "q couple months" (06/04/2016)  . Peripheral neuropathy   . Thyroid disease   . Type 1 diabetes mellitus ([place])   . Vitamin D deficiency      Social History   Socioeconomic History  . Marital status: Married    Spouse name: Not on file  . Number of children: 3  . Years of education: Not on file  . Highest education level: Not on file  Occupational History  . Occupation: [employer]  Social Needs  . Financial resource strain: Not on file  . Food insecurity:    Worry: Not on file    Inability: Not on file  . Transportation needs:    Medical: Not on file    Non-medical: Not on file  Tobacco Use  . Smoking status: Never Smoker  . Smokeless tobacco: Never Used  Substance and Sexual Activity  . Alcohol use: No  . Drug use: No  . Sexual activity: Yes  Lifestyle  . Physical activity:    Days per week: Not on file    Minutes per session: Not on file  . Stress: Not on file  Relationships  . Social connections:    Talks on phone: Not on file    Gets together: Not on file    Attends religious service: Not on file    Active member of club or organization: Not on file    Attends meetings of clubs or organizations: Not on file    Relationship status: Not on file  . Intimate partner violence:    Fear of current or ex partner: Not on file    Emotionally abused: Not on file    Physically abused: Not on file    Forced sexual activity: Not on file  Other Topics Concern  . Not on file  Social History Narrative  . Not on file    Past Surgical History:  Procedure Laterality Date  . insulin pump  2005-present   "upgrades q 3 yrs" (06/04/2016)    Family History  Problem Relation Age of Onset  . Breast cancer Mother   . Hyperlipidemia Father   . Hypertension Father   . Colon cancer Paternal Grandfather   . Lung cancer Paternal Uncle        smoler  . Liver disease Paternal Uncle     No Known Allergies  Current Outpatient Medications on File Prior to Visit  Medication Sig Dispense Refill  . acetaminophen (TYLENOL) 500 MG tablet Take 500 mg by mouth 3 (three) times daily as needed for mild pain.    Marland Kitchen amitriptyline (ELAVIL) 10 MG tablet Take 1 tablet (10 mg total) by mouth at bedtime.    Marland Kitchen aspirin EC 81 MG EC tablet Take 1 tablet (81 mg total) by mouth daily. 30 tablet 0  . cetirizine (ZYRTEC) 10 MG tablet Take 10 mg by mouth daily as needed  for allergies.    . cyclobenzaprine (FLEXERIL) 5 MG tablet As needed  0  . fluticasone (FLONASE) 50 MCG/ACT nasal spray SHAKE LIQUID AND USE 2 SPRAYS IN EACH NOSTRIL DAILY (Patient taking differently: SHAKE LIQUID AND USE 1-2 SPRAYS IN EACH NOSTRIL DAILY  AS NEEDED FOR ALLERGIES) 48 g 1  . gabapentin (NEURONTIN) 600 MG tablet Take 600 mg by mouth 4 (four) times daily.     . Insulin Human (INSULIN PUMP) SOLN Inject into the skin continuous. Use with Humalog - basal rate    . insulin NPH Human (HUMULIN N,NOVOLIN N) 100 UNIT/ML injection Inject 6-45 Units into the skin See admin instructions. Used withInsulin pump 35-45 units per day per basal rate...Marland KitchenMarland KitchenInject 6-12 units subcutaneously per sliding scale as needed for blood sugar correction with insulin pump    . meloxicam (MOBIC) 7.5 MG tablet As needed  0  . PAZEO 0.7 % SOLN Place 1 drop into both eyes daily as needed for allergies.  3  . pravastatin (PRAVACHOL) 20 MG tablet Take 20 mg by mouth daily.    . silver sulfADIAZINE (SILVADENE) 1 % cream Apply 1 application topically 2 (two) times daily. 50 g 0  . SUMAtriptan (IMITREX) 25 MG tablet Take 1 tablet (25 mg total) by mouth every 2 (two) hours as needed for migraine. May repeat in 2 hours if headache persists or recurs. 10 tablet 0  . traMADol (ULTRAM) 50 MG tablet Take 1 tablet (50 mg total) by mouth every 12 (twelve) hours as needed for severe pain. 6 tablet 0  . Vitamin D, Ergocalciferol, (DRISDOL) 50000 units CAPS capsule Take 50,000 Units by mouth every Monday.    . Vitamin D, Ergocalciferol, (DRISDOL) 50000 units CAPS capsule Take 1 capsule (50,000 Units total) by mouth every 7 (seven) days. 12 capsule 0   No current facility-administered medications on file prior to visit.     BP 119/68   Pulse 71   Temp 98.6 F (37 C) (Oral)   Resp 16   Ht 4\' 10"  (1.473 m)   Wt 114 lb (51.7 kg)   SpO2 100%   BMI 23.83 kg/m       Objective:   Physical Exam  General  Mental Status - Alert.  General Appearance - Well groomed. Not in acute distress.  Skin On inspection of scalp. No obvious follicultis but faint mild tenderness.  HEENT Head- Normal. Ear Auditory Canal - Left- faint mild swollen(tragus is mild tender)l. Right - Normal.Tympanic Membrane- Left- Normal. Right- Normal. Eye Sclera/Conjunctiva- Left- Normal. Right- Normal. Nose &  Sinuses Nasal Mucosa- Left-  Boggy and Congested. Right-  Boggy and  Congested.Bilateral maxillary and frontal sinus pressure. Mouth & Throat Lips: Upper Lip- Normal: no dryness, cracking, pallor, cyanosis, or vesicular eruption. Lower Lip-Normal: no dryness, cracking, pallor, cyanosis or vesicular eruption. Buccal Mucosa- Bilateral- No Aphthous ulcers. Oropharynx- No Discharge or Erythema. Tonsils: Characteristics- Bilateral- No Erythema or Congestion. Size/Enlargement- Bilateral- No enlargement. Discharge- bilateral-None.  Neck Neck- Supple. No Masses.   Chest and Lung Exam Auscultation: Breath Sounds:-Clear even and unlabored.  Cardiovascular Auscultation:Rythm- Regular, rate and rhythm. Murmurs & Other Heart Sounds:Ausculatation of the heart reveal- No Murmurs.  Lymphatic Head & Neck General Head & Neck Lymphatics: Bilateral: Description- No Localized lymphadenopathy.   Neurologic Cranial Nerve exam:- CN III-XII intact(No nystagmus), symmetric smile. Drift Test:- No drift. Romberg Exam:- Negative.  .Finger to Nose:- Normal/Intact Strength:- 5/5 equal and symmetric strength both upper and lower extremities.     Assessment & Plan:  For your probable left otitis externa and concern for possible early ear infection otitis media, I am prescribing cortisporin otic and augmentin antibiotic.   Also concern for scalp folliculitis based on your tenderness. Augmentin should help with that.  Repeat vitamin D level today. Recommend see what endocrinologist thinks about level if still low despite supplementation.  Follow up 7-10  days or as needed.  Did discuss with pt since she is diabetic that if her ear symptoms worsen over weekend despite the above measures then be seen ED.   Mackie Pai, PA-C

## 2017-12-08 NOTE — Patient Instructions (Addendum)
For your probable left otitis externa and concern for possible early ear infection otitis media, I am prescribing cortisporin otic and augmentin antibiotic.   Also concern for scalp folliculitis based on your tenderness. Augmentin should help with that.  Repeat vitamin D level today. Recommend see what endocrinologist thinks about level if still low despite supplementation.  Follow up 7-10 days or as needed.

## 2017-12-08 NOTE — Addendum Note (Signed)
Addended by: Caffie Pinto on: 12/08/2017 03:41 PM   Modules accepted: Orders

## 2017-12-09 LAB — VITAMIN D 25 HYDROXY (VIT D DEFICIENCY, FRACTURES): VITD: 10.14 ng/mL — AB (ref 30.00–100.00)

## 2017-12-14 DIAGNOSIS — R7989 Other specified abnormal findings of blood chemistry: Secondary | ICD-10-CM | POA: Diagnosis not present

## 2017-12-14 DIAGNOSIS — E1042 Type 1 diabetes mellitus with diabetic polyneuropathy: Secondary | ICD-10-CM | POA: Diagnosis not present

## 2018-03-28 ENCOUNTER — Encounter: Payer: Self-pay | Admitting: Medical

## 2018-08-14 ENCOUNTER — Other Ambulatory Visit: Payer: Self-pay

## 2018-08-14 ENCOUNTER — Other Ambulatory Visit: Payer: Self-pay | Admitting: Medical

## 2018-08-14 ENCOUNTER — Encounter: Payer: Self-pay | Admitting: Medical

## 2018-08-14 ENCOUNTER — Ambulatory Visit (INDEPENDENT_AMBULATORY_CARE_PROVIDER_SITE_OTHER): Payer: Medicare Other | Admitting: Medical

## 2018-08-14 ENCOUNTER — Telehealth: Payer: Self-pay | Admitting: Medical

## 2018-08-14 VITALS — BP 155/116 | HR 87 | Ht 58.5 in | Wt 108.0 lb

## 2018-08-14 DIAGNOSIS — M7711 Lateral epicondylitis, right elbow: Secondary | ICD-10-CM

## 2018-08-14 MED ORDER — MELOXICAM 7.5 MG PO TABS
ORAL_TABLET | ORAL | 0 refills | Status: DC
Start: 2018-08-14 — End: 2018-08-18

## 2018-08-14 MED ORDER — HYDROCODONE-ACETAMINOPHEN 5-325 MG PO TABS: ORAL_TABLET | ORAL | 0 refills | Status: DC

## 2018-08-14 NOTE — Telephone Encounter (Signed)
Called pt. Pt is okay with Telephone call form PCP.

## 2018-08-14 NOTE — Progress Notes (Signed)
   Subjective:    Patient ID: Lisa Levine, female    DOB: Oct 16, 1975, 43 y.o.   MRN: [id number]  HPI  Virtual Visit via Telephone Note  I connected with Lisa Levine on 08/14/18 at  2:40 PM EDT by telephone and verified that I am speaking with the correct person using two identifiers.   I discussed the limitations, risks, security and privacy concerns of performing an evaluation and management service by telephone and the availability of in person appointments. I also discussed with the patient that there may be a patient responsible charge related to this service. The patient expressed understanding and agreed to proceed.    History of Present Illness:  Pt has elbow pain increased over 2 weeks. She has been seeing sports medicine [address]. She states has got 2 steroid injections in past. Also had been recently on nitroglycerin patch. She used 1/2 of patch and that caused nauseau. Pt has used tylenol and advil. The pain is sharp and on rt side. She is using brace. Pt states in past sugars did go up with steroid shots. Sugar only went up for  24 hours. Pt in past had GFR above 60 in past. Pt does not know if she ever had xray. No ultrasound. Pain seems worse at night. She uses brace at night.    Observations/Objective: Pt states- direct pain on palpation lateral epicondyle region right elbow.  Pain on movement per patient.  The area is not red or swollen per patient.   Assessment and Plan: Lateral epidondyltis.  I did advise patient today that would prescribe meloxicam 7.5 mg to take 1 to 2 tablets daily.  She did not think nitroglycerin patches were helping so I advised her to try lidocaine patch over the area to see if this helps.  Also at night she is having difficulty sleeping due to pain so I did prescribe Norco 5/325 1tablet p.o. nightly as needed severe pain.  Patient will update [place] in 7 to 10 days how she is doing.  Sooner if needed.  I explained that if her pain is persisting then  might consider getting x-ray of her elbow to see if she has spur this formed and maybe refer her to her sports medicine MD or offer potentially sports MD upstairs at the med center.  Follow Up Instructions:    I discussed the assessment and treatment plan with the patient. The patient was provided an opportunity to ask questions and all were answered. The patient agreed with the plan and demonstrated an understanding of the instructions.   The patient was advised to call back or seek an in-person evaluation if the symptoms worsen or if the condition fails to improve as anticipated.  I provided 20 minutes of non-face-to-face time during this encounter.   Mackie Pai, PA-C   Review of Systems     Objective:   Physical Exam        Assessment & Plan:

## 2018-08-14 NOTE — Telephone Encounter (Signed)
Would you call patient and explain I can offer telephone visit and we may have web ex/virtual visit in near future. She might not be able to get in with sports med or orth in near future so want to discuss medication that she may need going forward in event virus situation worsens and specialist office are closed which they are already doing.

## 2018-08-14 NOTE — Patient Instructions (Signed)
Lateral epidondyltis.  I did advise patient today that would prescribe meloxicam 7.5 mg to take 1 to 2 tablets daily.  She did not think nitroglycerin patches were helping so I advised her to try lidocaine patch over the area to see if this helps.  Also at night she is having difficulty sleeping due to pain so I did prescribe Norco 5/325 1tablet p.o. nightly as needed severe pain.  Patient will update [place] in 7 to 10 days how she is doing.  Sooner if needed.  I explained that if her pain is persisting then might consider getting x-ray of her elbow to see if she has spur this formed and maybe refer her to her sports medicine MD or offer potentially sports MD upstairs at the med center.

## 2018-08-18 ENCOUNTER — Other Ambulatory Visit: Payer: Self-pay | Admitting: Medical

## 2018-08-24 ENCOUNTER — Other Ambulatory Visit: Payer: Self-pay | Admitting: Medical

## 2018-08-29 ENCOUNTER — Encounter: Payer: Self-pay | Admitting: Medical

## 2018-08-31 ENCOUNTER — Telehealth: Payer: Self-pay | Admitting: Medical

## 2018-08-31 DIAGNOSIS — M25521 Pain in right elbow: Secondary | ICD-10-CM

## 2018-08-31 DIAGNOSIS — E109 Type 1 diabetes mellitus without complications: Secondary | ICD-10-CM

## 2018-08-31 DIAGNOSIS — R7989 Other specified abnormal findings of blood chemistry: Secondary | ICD-10-CM

## 2018-08-31 NOTE — Telephone Encounter (Signed)
Xray order placed.

## 2018-08-31 NOTE — Telephone Encounter (Signed)
Future labs placed. 

## 2018-09-01 ENCOUNTER — Ambulatory Visit (HOSPITAL_BASED_OUTPATIENT_CLINIC_OR_DEPARTMENT_OTHER)
Admission: RE | Admit: 2018-09-01 | Discharge: 2018-09-01 | Disposition: A | Discharge status: Home / Self Care | Payer: Medicare Other | Source: Ambulatory Visit | Attending: Medical | Admitting: Medical

## 2018-09-01 ENCOUNTER — Other Ambulatory Visit: Payer: Self-pay

## 2018-09-01 DIAGNOSIS — M25521 Pain in right elbow: Secondary | ICD-10-CM | POA: Diagnosis not present

## 2018-09-04 ENCOUNTER — Telehealth: Payer: Self-pay

## 2018-09-04 NOTE — Telephone Encounter (Signed)
Copied from [place] [id number]. Topic: General - Other >> Sep 01, 2018  9:15 AM Carolyn Stare wrote:  Pt left VM on [facility] appt line, Pt has order for lab appt and would like to schedule

## 2018-09-04 NOTE — Telephone Encounter (Signed)
Left pt a message to call back to schedule lab appointment.

## 2018-09-05 ENCOUNTER — Other Ambulatory Visit: Payer: Self-pay

## 2018-09-05 ENCOUNTER — Other Ambulatory Visit (INDEPENDENT_AMBULATORY_CARE_PROVIDER_SITE_OTHER): Payer: Medicare Other

## 2018-09-05 DIAGNOSIS — E109 Type 1 diabetes mellitus without complications: Secondary | ICD-10-CM

## 2018-09-05 DIAGNOSIS — R7989 Other specified abnormal findings of blood chemistry: Secondary | ICD-10-CM | POA: Diagnosis not present

## 2018-09-05 LAB — COMPREHENSIVE METABOLIC PANEL
ALT: 8 U/L (ref 0–35)
AST: 13 U/L (ref 0–37)
Albumin: 4 g/dL (ref 3.5–5.2)
Alkaline Phosphatase: 41 U/L (ref 39–117)
BUN: 10 mg/dL (ref 6–23)
CO2: 29 mEq/L (ref 19–32)
Calcium: 8.8 mg/dL (ref 8.4–10.5)
Chloride: 103 mEq/L (ref 96–112)
Creatinine, Ser: 0.71 mg/dL (ref 0.40–1.20)
GFR: 90.17 mL/min (ref >60.00)
Glucose, Bld: 82 mg/dL (ref 70–99)
Potassium: 3.9 mEq/L (ref 3.5–5.1)
Sodium: 140 mEq/L (ref 135–145)
Total Bilirubin: 0.8 mg/dL (ref 0.2–1.2)
Total Protein: 7.2 g/dL (ref 6.0–8.3)

## 2018-09-05 LAB — HEMOGLOBIN A1C: Hgb A1c MFr Bld: 7.8 % — ABNORMAL HIGH (ref 4.6–6.5)

## 2018-09-05 LAB — VITAMIN D 25 HYDROXY (VIT D DEFICIENCY, FRACTURES): VITD: 37.76 ng/mL (ref 30.00–100.00)

## 2018-10-27 ENCOUNTER — Encounter: Payer: Self-pay | Admitting: Medical

## 2018-10-30 ENCOUNTER — Telehealth: Payer: Self-pay | Admitting: Medical

## 2018-10-30 DIAGNOSIS — M7710 Lateral epicondylitis, unspecified elbow: Secondary | ICD-10-CM

## 2018-10-30 NOTE — Telephone Encounter (Signed)
Referral to sports med placed.

## 2018-11-01 ENCOUNTER — Other Ambulatory Visit: Payer: Self-pay

## 2018-11-01 ENCOUNTER — Encounter: Payer: Self-pay | Admitting: Family Medicine

## 2018-11-01 ENCOUNTER — Ambulatory Visit: Payer: Self-pay

## 2018-11-01 ENCOUNTER — Ambulatory Visit (INDEPENDENT_AMBULATORY_CARE_PROVIDER_SITE_OTHER): Payer: Medicare Other | Admitting: Family Medicine

## 2018-11-01 VITALS — BP 98/64 | HR 67 | Ht 59.0 in | Wt 105.0 lb

## 2018-11-01 DIAGNOSIS — M25521 Pain in right elbow: Secondary | ICD-10-CM

## 2018-11-01 DIAGNOSIS — M7711 Lateral epicondylitis, right elbow: Secondary | ICD-10-CM

## 2018-11-01 NOTE — Assessment & Plan Note (Signed)
Acute on chronic worsening. Has had 2 steroid injections.  -Provided Pennsaid samples. -Counseled on home exercise therapy and supportive care. -MRI to evaluate for chronic calcific changes as well as possible joint effusion -Counseled on compression and working on her work posture

## 2018-11-01 NOTE — Patient Instructions (Signed)
Nice to meet you Please try the rub on medicine. A peasize amount 2-3 times daily.  I will call you after the MRI is completed  Please try standing at work and changing your posture.  Please send me a message in MyChart with any questions or updates.   --Dr. Raeford Razor  Nitroglycerin Protocol   Apply 1/4 nitroglycerin patch to affected area daily.  Change position of patch within the affected area every 24 hours.  You may experience a headache during the first 1-2 weeks of using the patch, these should subside.  If you experience headaches after beginning nitroglycerin patch treatment, you may take your preferred over the counter pain reliever.  Another side effect of the nitroglycerin patch is skin irritation or rash related to patch adhesive.  Please notify our office if you develop more severe headaches or rash, and stop the patch.  Tendon healing with nitroglycerin patch may require 12 to 24 weeks depending on the extent of injury.  Men should not use if taking Viagra, Cialis, or Levitra.   Do not use if you have migraines or rosacea.

## 2018-11-01 NOTE — Progress Notes (Signed)
Lisa Levine - 43 y.o. female MRN [id number]  Date of birth: [birth year]  SUBJECTIVE:  Including CC & ROS.  Chief Complaint  Patient presents with  . Elbow Pain    right elbow    Lisa Levine is a 43 y.o. female that is presenting with acute on chronic worsening right elbow pain.  The pain is occurring over the lateral epicondyle.  The pain has persisted over the past year.  She has received 2 steroid injections.  She is tried nitroglycerin patches and meloxicam.  She has had limited improvement with these medications.  She had improvement with the injections but the pain returned.  The pain is occurring when she does certain movements.  It can be sharp and severe.  It seems localized to the elbow.  She also reports having swelling in the elbow joint from time to time.  She has worn a brace.  Denies any history of surgery.  Denies any specific inciting event.  At times she will have significant sharp pain and drop what she has in her hand.  She is right-handed.  She is tried meloxicam and nitroglycerin patches.  Independent review of the right elbow x-ray from 4/10 shows no acute abnormalities.  Review of Systems  Constitutional: Negative for fever.  HENT: Negative for congestion.   Respiratory: Negative for cough.   Cardiovascular: Negative for chest pain.  Gastrointestinal: Negative for abdominal pain.  Musculoskeletal: Negative for back pain.  Skin: Negative for color change.  Neurological: Positive for numbness.  Hematological: Negative for adenopathy.    HISTORY: Past Medical, Surgical, Social, and Family History Reviewed & Updated per EMR.   Pertinent Historical Findings include:  Past Medical History:  Diagnosis Date  . Allergy   . Arthritis   . DVT (deep venous thrombosis) ([place])    "often on my LLE" (06/04/2016)  . Hyperlipidemia   . Hypertension   . Migraine    "q couple months" (06/04/2016)  . Peripheral neuropathy   . Thyroid disease   . Type 1 diabetes mellitus ([place])   .  Vitamin D deficiency     Past Surgical History:  Procedure Laterality Date  . insulin pump  2005-present   "upgrades q 3 yrs" (06/04/2016)    No Known Allergies  Family History  Problem Relation Age of Onset  . Breast cancer Mother   . Hyperlipidemia Father   . Hypertension Father   . Colon cancer Paternal Grandfather   . Lung cancer Paternal Uncle        smoler  . Liver disease Paternal Uncle      Social History   Socioeconomic History  . Marital status: Married    Spouse name: Not on file  . Number of children: 3  . Years of education: Not on file  . Highest education level: Not on file  Occupational History  . Occupation: [employer]  Social Needs  . Financial resource strain: Not on file  . Food insecurity:    Worry: Not on file    Inability: Not on file  . Transportation needs:    Medical: Not on file    Non-medical: Not on file  Tobacco Use  . Smoking status: Never Smoker  . Smokeless tobacco: Never Used  Substance and Sexual Activity  . Alcohol use: No  . Drug use: No  . Sexual activity: Yes  Lifestyle  . Physical activity:    Days per week: Not on file    Minutes per session:  Not on file  . Stress: Not on file  Relationships  . Social connections:    Talks on phone: Not on file    Gets together: Not on file    Attends religious service: Not on file    Active member of club or organization: Not on file    Attends meetings of clubs or organizations: Not on file    Relationship status: Not on file  . Intimate partner violence:    Fear of current or ex partner: Not on file    Emotionally abused: Not on file    Physically abused: Not on file    Forced sexual activity: Not on file  Other Topics Concern  . Not on file  Social History Narrative  . Not on file     PHYSICAL EXAM:  VS: BP 98/64   Pulse 67   Ht 4\' 11"  (1.499 m)   Wt 105 lb (47.6 kg)   BMI 21.21 kg/m  Physical Exam Gen: NAD, alert, cooperative with exam,  well-appearing ENT: normal lips, normal nasal mucosa,  Eye: normal EOM, normal conjunctiva and lids CV:  no edema, +2 pedal pulses   Resp: no accessory muscle use, non-labored,  Skin: no rashes, no areas of induration  Neuro: normal tone, normal sensation to touch Psych:  normal insight, alert and oriented MSK:  Right elbow:  TTP over the LE  Normal ROM  Normal strength to resistance  No instability with valgus and varus stress  Pain with resisted pronation and supination  Neurovascularly intact.   Limited ultrasound: right elbow:  Mild effusion in the joint  Chronic calcific changes of the origin of the common extensors on the lateral epicondyle.  Small bone spur at the tip of the lateral epicondyle   Summary: Chronic calcific tendinopathy and a small joint effusion.  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 Has had 2 steroid injections.  -Provided Pennsaid samples. -Counseled on home exercise therapy and supportive care. -MRI to evaluate for chronic calcific changes as well as possible joint effusion -Counseled on compression and working on her work posture

## 2018-11-22 ENCOUNTER — Other Ambulatory Visit: Payer: Medicare Other

## 2018-12-22 ENCOUNTER — Encounter: Payer: Self-pay | Admitting: Medical

## 2018-12-23 ENCOUNTER — Telehealth: Payer: Self-pay | Admitting: Medical

## 2018-12-23 MED ORDER — MELOXICAM 7.5 MG PO TABS: ORAL_TABLET | ORAL | 0 refills | Status: DC

## 2018-12-23 MED ORDER — TRAMADOL HCL 50 MG PO TABS: 50.0000 mg | ORAL_TABLET | Freq: Two times a day (BID) | ORAL | 0 refills | Status: DC | PRN

## 2018-12-23 MED ORDER — DICLOFENAC SODIUM 1 % TD GEL: 4.0000 g | Freq: Four times a day (QID) | TRANSDERMAL | 0 refills | Status: DC

## 2018-12-23 NOTE — Telephone Encounter (Signed)
Rx tramadol sent for severe elbow pain.

## 2018-12-25 ENCOUNTER — Telehealth: Payer: Self-pay | Admitting: *Deleted

## 2018-12-25 NOTE — Telephone Encounter (Signed)
[facility] sent over a fax on 12/23/18 for meloxicam to please verify dosage as maxium recommended does is 15mg  once daily.  rx written for 7.5mg  1 to 2 tabs bid.

## 2018-12-27 ENCOUNTER — Other Ambulatory Visit: Payer: Self-pay | Admitting: *Deleted

## 2018-12-27 MED ORDER — MELOXICAM 7.5 MG PO TABS: ORAL_TABLET | ORAL | 0 refills | Status: DC

## 2018-12-27 NOTE — Telephone Encounter (Signed)
Yes, aware of max dose. And if she can get by with one tablet a day of 7.5 mg would be better. If needs to take 15 mg total then pt has flexibility.  Can you notify pharmacy.

## 2018-12-27 NOTE — Telephone Encounter (Signed)
Pharmacy notified and patient notified to take 1 to 2 tablets qd.  She had stated that she was taking Aleve as well.  Advised to stop Aleve while taking this medication.  She stated that she has only been taking 1 tablet.  Advised to increase to 2 tablets a day to see if that will work.

## 2019-01-04 ENCOUNTER — Other Ambulatory Visit: Payer: Self-pay | Admitting: Medical

## 2019-01-20 ENCOUNTER — Other Ambulatory Visit: Payer: Self-pay | Admitting: Medical

## 2019-02-14 ENCOUNTER — Other Ambulatory Visit: Payer: Self-pay

## 2019-02-15 ENCOUNTER — Ambulatory Visit (INDEPENDENT_AMBULATORY_CARE_PROVIDER_SITE_OTHER): Payer: Medicare Other | Admitting: Medical

## 2019-02-15 ENCOUNTER — Encounter: Payer: Self-pay | Admitting: Medical

## 2019-02-15 VITALS — BP 107/54 | HR 63 | Temp 96.9°F | Ht 59.0 in | Wt 112.4 lb

## 2019-02-15 DIAGNOSIS — M7710 Lateral epicondylitis, unspecified elbow: Secondary | ICD-10-CM

## 2019-02-15 DIAGNOSIS — E109 Type 1 diabetes mellitus without complications: Secondary | ICD-10-CM

## 2019-02-15 DIAGNOSIS — R7989 Other specified abnormal findings of blood chemistry: Secondary | ICD-10-CM

## 2019-02-15 DIAGNOSIS — E785 Hyperlipidemia, unspecified: Secondary | ICD-10-CM

## 2019-02-15 LAB — COMPREHENSIVE METABOLIC PANEL
ALT: 8 U/L (ref 0–35)
AST: 13 U/L (ref 0–37)
Albumin: 4.2 g/dL (ref 3.5–5.2)
Alkaline Phosphatase: 44 U/L (ref 39–117)
BUN: 9 mg/dL (ref 6–23)
CO2: 31 mEq/L (ref 19–32)
Calcium: 9.3 mg/dL (ref 8.4–10.5)
Chloride: 100 mEq/L (ref 96–112)
Creatinine, Ser: 0.62 mg/dL (ref 0.40–1.20)
GFR: 105.21 mL/min (ref >60.00)
Glucose, Bld: 146 mg/dL — ABNORMAL HIGH (ref 70–99)
Potassium: 4 mEq/L (ref 3.5–5.1)
Sodium: 137 mEq/L (ref 135–145)
Total Bilirubin: 0.6 mg/dL (ref 0.2–1.2)
Total Protein: 7.3 g/dL (ref 6.0–8.3)

## 2019-02-15 LAB — LIPID PANEL
Cholesterol: 222 mg/dL — ABNORMAL HIGH (ref 0–200)
HDL: 64.2 mg/dL (ref >39.00)
LDL Cholesterol: 145 mg/dL — ABNORMAL HIGH (ref 0–99)
NonHDL: 157.46
Total CHOL/HDL Ratio: 3
Triglycerides: 60 mg/dL (ref 0.0–149.0)
VLDL: 12 mg/dL (ref 0.0–40.0)

## 2019-02-15 MED ORDER — ATORVASTATIN CALCIUM 10 MG PO TABS: 10.0000 mg | ORAL_TABLET | Freq: Every day | ORAL | 3 refills | Status: DC

## 2019-02-15 MED ORDER — DICLOFENAC SODIUM 1 % TD GEL: 4.0000 g | Freq: Four times a day (QID) | TRANSDERMAL | 0 refills | Status: DC

## 2019-02-15 NOTE — Progress Notes (Signed)
Subjective:    Patient ID: Lisa Levine, female    DOB: Nov 12, 1975, 43 y.o.   MRN: [id number]  HPI  Pt in for follow up.  Pt give update that her rt elbow lateral epicondylitis is better. She states after typing a lot will have pain. But voltaren gel seems to help.  Pt does not get flu vaccine on regular basis. Would occasionally get flu vaccine. She declines and will update [place] if she changes mind.  Pt seeing endocrinologist for her diabetes. Last a1c 7.8 5 months ago. Pt will see endocrinologist  next month.  Pt last vit D was 37.6. She in past runs low despite high supplementation. But recently past month has been using 2000 mcg dose. In past if took 50,000 states got constipated.   Review of Systems  Constitutional: Negative for chills, fatigue and fever.  HENT: Negative for congestion, ear pain, hearing loss, postnasal drip, rhinorrhea, sinus pressure and sinus pain.   Respiratory: Negative for cough, chest tightness, wheezing and stridor.   Cardiovascular: Negative for chest pain and palpitations.  Gastrointestinal: Negative for abdominal pain, diarrhea and nausea.  Musculoskeletal: Negative for back pain, myalgias and neck pain.  Skin: Negative for rash.  Neurological: Negative for dizziness, syncope, weakness and headaches.  Hematological: Negative for adenopathy. Does not bruise/bleed easily.  Psychiatric/Behavioral: Negative for behavioral problems and confusion.    Past Medical History:  Diagnosis Date  . Allergy   . Arthritis   . DVT (deep venous thrombosis) ([place])    "often on my LLE" (06/04/2016)  . Hyperlipidemia   . Hypertension   . Migraine    "q couple months" (06/04/2016)  . Peripheral neuropathy   . Thyroid disease   . Type 1 diabetes mellitus ([place])   . Vitamin D deficiency      Social History   Socioeconomic History  . Marital status: Married    Spouse name: Not on file  . Number of children: 3  . Years of education: Not on file  . Highest  education level: Not on file  Occupational History  . Occupation: [employer]  Social Needs  . Financial resource strain: Not on file  . Food insecurity    Worry: Not on file    Inability: Not on file  . Transportation needs    Medical: Not on file    Non-medical: Not on file  Tobacco Use  . Smoking status: Never Smoker  . Smokeless tobacco: Never Used  Substance and Sexual Activity  . Alcohol use: No  . Drug use: No  . Sexual activity: Yes  Lifestyle  . Physical activity    Days per week: Not on file    Minutes per session: Not on file  . Stress: Not on file  Relationships  . Social Herbalist on phone: Not on file    Gets together: Not on file    Attends religious service: Not on file    Active member of club or organization: Not on file    Attends meetings of clubs or organizations: Not on file    Relationship status: Not on file  . Intimate partner violence    Fear of current or ex partner: Not on file    Emotionally abused: Not on file    Physically abused: Not on file    Forced sexual activity: Not on file  Other Topics Concern  . Not on file  Social History Narrative  . Not on file  Past Surgical History:  Procedure Laterality Date  . insulin pump  2005-present   "upgrades q 3 yrs" (06/04/2016)    Family History  Problem Relation Age of Onset  . Breast cancer Mother   . Hyperlipidemia Father   . Hypertension Father   . Colon cancer Paternal Grandfather   . Lung cancer Paternal Uncle        smoler  . Liver disease Paternal Uncle     No Known Allergies  Current Outpatient Medications on File Prior to Visit  Medication Sig Dispense Refill  . acetaminophen (TYLENOL) 500 MG tablet Take 500 mg by mouth 3 (three) times daily as needed for mild pain.    Marland Kitchen amitriptyline (ELAVIL) 10 MG tablet Take 1 tablet (10 mg total) by mouth at bedtime.    Marland Kitchen aspirin EC 81 MG EC tablet Take 1 tablet (81 mg total) by mouth daily. 30 tablet 0  .  cetirizine (ZYRTEC) 10 MG tablet Take 10 mg by mouth daily as needed for allergies.    . cyclobenzaprine (FLEXERIL) 5 MG tablet As needed  0  . fluticasone (FLONASE) 50 MCG/ACT nasal spray SHAKE LIQUID AND USE 2 SPRAYS IN EACH NOSTRIL DAILY (Patient taking differently: SHAKE LIQUID AND USE 1-2 SPRAYS IN EACH NOSTRIL DAILY  AS NEEDED FOR ALLERGIES) 48 g 1  . gabapentin (NEURONTIN) 600 MG tablet Take 600 mg by mouth 4 (four) times daily.     . Insulin Human (INSULIN PUMP) SOLN Inject into the skin continuous. Use with Humalog - basal rate    . insulin NPH Human (HUMULIN N,NOVOLIN N) 100 UNIT/ML injection Inject 6-45 Units into the skin See admin instructions. Used withInsulin pump 35-45 units per day per basal rate...Marland KitchenMarland KitchenInject 6-12 units subcutaneously per sliding scale as needed for blood sugar correction with insulin pump    . meloxicam (MOBIC) 7.5 MG tablet TAKE 1 TO 2 TABLETS BY MOUTH TWICE DAILY AS NEEDED 30 tablet 0  . PAZEO 0.7 % SOLN Place 1 drop into both eyes daily as needed for allergies.  3  . pravastatin (PRAVACHOL) 20 MG tablet Take 20 mg by mouth daily.    . silver sulfADIAZINE (SILVADENE) 1 % cream Apply 1 application topically 2 (two) times daily. 50 g 0  . SUMAtriptan (IMITREX) 25 MG tablet Take 1 tablet (25 mg total) by mouth every 2 (two) hours as needed for migraine. May repeat in 2 hours if headache persists or recurs. 10 tablet 0  . traMADol (ULTRAM) 50 MG tablet Take 1 tablet (50 mg total) by mouth every 12 (twelve) hours as needed for severe pain. 8 tablet 0  . Vitamin D, Ergocalciferol, (DRISDOL) 50000 units CAPS capsule Take 50,000 Units by mouth every Monday.    . Vitamin D, Ergocalciferol, (DRISDOL) 50000 units CAPS capsule Take 1 capsule (50,000 Units total) by mouth every 7 (seven) days. 12 capsule 0   No current facility-administered medications on file prior to visit.     BP (!) 107/54   Pulse 63   Temp (!) 96.9 F (36.1 C)   Ht 4\' 11"  (1.499 m)   Wt 112 lb 6.4 oz  (51 kg)   SpO2 100%   BMI 22.70 kg/m       Objective:   Physical Exam  General Mental Status- Alert. General Appearance- Not in acute distress.   Skin General: Color- Normal Color. Moisture- Normal Moisture.  Neck Carotid Arteries- Normal color. Moisture- Normal Moisture. No carotid bruits. No JVD.  Chest and Lung  Exam Auscultation: Breath Sounds:-Normal.  Cardiovascular Auscultation:Rythm- Regular. Murmurs & Other Heart Sounds:Auscultation of the heart reveals- No Murmurs.  Abdomen Inspection:-Inspeection Normal. Palpation/Percussion:Note:No mass. Palpation and Percussion of the abdomen reveal- Non Tender, Non Distended + BS, no rebound or guarding.  Neurologic Cranial Nerve exam:- CN III-XII intact(No nystagmus), symmetric smile. Strength:- 5/5 equal and symmetric strength both upper and lower extremities.  Rt elbow- faint lateral epicondyle tenderness to palpation.      Assessment & Plan:

## 2019-02-15 NOTE — Patient Instructions (Addendum)
Hyperlipidemia, epicondy. Diabetes. Low bit d   I am glad to hear that your lateral epicondylitis has improved.  Some minimal tenderness still on exam and did refill your Voltaren gel.  For history of low vitamin D, I did go ahead and place vitamin D lab today.  We will follow your level and determine what level of supplementation needed.  For diabetes, I went ahead and ordered metabolic panel today.  Keep your appointment with endocrinologist in the upcoming months.  For history of high cholesterol, I did order lipid panel today.  In the past you were on pravastatin and I do think with your diabetes history it is a good idea for you to be on low dose atorvastatin.  I will send that to your pharmacy.  Flu vaccine and pneumonia declined today.  Counseled on benefit.  If you change your mind please let us know.  Follow-up date to be determined after lab review.

## 2019-02-19 LAB — VITAMIN D 1,25 DIHYDROXY
Vitamin D 1, 25 (OH)2 Total: 65 pg/mL (ref 18–72)
Vitamin D2 1, 25 (OH)2: 19 pg/mL
Vitamin D3 1, 25 (OH)2: 46 pg/mL

## 2019-06-24 ENCOUNTER — Other Ambulatory Visit: Payer: Self-pay | Admitting: Medical

## 2019-06-29 ENCOUNTER — Other Ambulatory Visit: Payer: Self-pay | Admitting: Medical

## 2019-07-31 ENCOUNTER — Ambulatory Visit: Payer: Medicare Other | Admitting: Internal Medicine

## 2019-07-31 ENCOUNTER — Other Ambulatory Visit: Payer: Self-pay

## 2019-08-01 ENCOUNTER — Other Ambulatory Visit: Payer: Self-pay

## 2019-08-02 ENCOUNTER — Telehealth: Payer: Self-pay | Admitting: Medical

## 2019-08-02 ENCOUNTER — Ambulatory Visit (INDEPENDENT_AMBULATORY_CARE_PROVIDER_SITE_OTHER): Payer: Medicare Other | Admitting: Medical

## 2019-08-02 DIAGNOSIS — H1033 Unspecified acute conjunctivitis, bilateral: Secondary | ICD-10-CM

## 2019-08-02 MED ORDER — TOBRAMYCIN 0.3 % OP OINT: 1.0000 "application " | TOPICAL_OINTMENT | Freq: Three times a day (TID) | OPHTHALMIC | 0 refills | Status: DC

## 2019-08-02 MED ORDER — CEPHALEXIN 500 MG PO CAPS: 500.0000 mg | ORAL_CAPSULE | Freq: Two times a day (BID) | ORAL | 0 refills | Status: DC

## 2019-08-02 MED ORDER — TOBRAMYCIN 0.3 % OP SOLN: 2.0000 [drp] | Freq: Four times a day (QID) | OPHTHALMIC | 0 refills | Status: DC

## 2019-08-02 NOTE — Telephone Encounter (Addendum)
Per Mia @ [facility]  [phone]  Two medication were sent , just need clarification on which to fill, both are different solutions.    tobramycin (TOBREX) 0.3 % ophthalmic ointment  tobramycin ophthalmic solution  DC solution and give ointment. Pt preferrence.

## 2019-08-02 NOTE — Progress Notes (Signed)
   Subjective:    Patient ID: Lisa Levine, female    DOB: Jun 28, 1975, 44 y.o.   MRN: [id number]  HPI  Virtual Visit via Video Note  I connected with [place] on 08/02/19 at  9:40 AM EST by a video enabled telemedicine application and verified that I am speaking with the correct person using two identifiers.  Location: Patient: home Provider: office   I discussed the limitations of evaluation and management by telemedicine and the availability of in person appointments. The patient expressed understanding and agreed to proceed.  History of Present Illness:  Pt had some redness to eye, puffiness to lids, and light sensitive.  Signs and symptoms for 5 days.  Pt states started on rt side first then left eye got irritated.   Pt had high sugars as she had insulin pump issues and she had brief blurred vision when sugar was elevated.   On Monday and Tuesday she had some crustiness to rt side eye lashes. Puffiness was on upper and lower lid area.  No fb injury reported.      Observations/Objective:  General-no acute distress, pleasant, oriented. Lungs- on inspection lungs appear unlabored. Neck- no tracheal deviation or jvd on inspection. Neuro- gross motor function appears intact.  Eye- currently on video conjunctiva clear on both side. Rt upper lid looks little puffy but no redness.   Assessment and Plan: For conjunctivitis rx Tobrex ointment.  Keflex only if needed for lid infection if were to occur over upcoming weekend.  Update early next week Monday or tuesday  Within 2-3 cpe/wellness.  Mackie Pai, PA-C  Follow Up Instructions:    I discussed the assessment and treatment plan with the patient. The patient was provided an opportunity to ask questions and all were answered. The patient agreed with the plan and demonstrated an understanding of the instructions.   The patient was advised to call back or seek an in-person evaluation if the symptoms worsen or if the  condition fails to improve as anticipated.  I provided 25 minutes of non-face-to-face time during this encounter.   Mackie Pai, PA-C   Review of Systems     Objective:   Physical Exam        Assessment & Plan:

## 2019-08-02 NOTE — Patient Instructions (Addendum)
For conjunctivitis rx Tobrex ointment.  Keflex only if needed for lid infection if were to occur over upcoming weekend.  Update early next week Monday or tuesday  Within 2-3 cpe/wellness.

## 2020-08-21 ENCOUNTER — Other Ambulatory Visit: Payer: Self-pay

## 2020-08-21 ENCOUNTER — Ambulatory Visit (INDEPENDENT_AMBULATORY_CARE_PROVIDER_SITE_OTHER): Payer: Medicare Other | Admitting: Medical

## 2020-08-21 VITALS — BP 132/76 | HR 83 | Temp 98.1°F | Ht 59.0 in | Wt 114.8 lb

## 2020-08-21 DIAGNOSIS — R5383 Other fatigue: Secondary | ICD-10-CM

## 2020-08-21 DIAGNOSIS — R0789 Other chest pain: Secondary | ICD-10-CM | POA: Diagnosis not present

## 2020-08-21 DIAGNOSIS — E785 Hyperlipidemia, unspecified: Secondary | ICD-10-CM | POA: Diagnosis not present

## 2020-08-21 DIAGNOSIS — R0609 Other forms of dyspnea: Secondary | ICD-10-CM

## 2020-08-21 DIAGNOSIS — R0600 Dyspnea, unspecified: Secondary | ICD-10-CM

## 2020-08-21 DIAGNOSIS — R079 Chest pain, unspecified: Secondary | ICD-10-CM | POA: Diagnosis not present

## 2020-08-21 DIAGNOSIS — E109 Type 1 diabetes mellitus without complications: Secondary | ICD-10-CM | POA: Diagnosis not present

## 2020-08-21 DIAGNOSIS — E010 Iodine-deficiency related diffuse (endemic) goiter: Secondary | ICD-10-CM

## 2020-08-21 LAB — COMPREHENSIVE METABOLIC PANEL
ALT: 8 U/L (ref 0–35)
AST: 14 U/L (ref 0–37)
Albumin: 4 g/dL (ref 3.5–5.2)
Alkaline Phosphatase: 42 U/L (ref 39–117)
BUN: 13 mg/dL (ref 6–23)
CO2: 32 mEq/L (ref 19–32)
Calcium: 9 mg/dL (ref 8.4–10.5)
Chloride: 101 mEq/L (ref 96–112)
Creatinine, Ser: 0.68 mg/dL (ref 0.40–1.20)
GFR: 106.08 mL/min (ref >60.00)
Glucose, Bld: 244 mg/dL — ABNORMAL HIGH (ref 70–99)
Potassium: 4.3 mEq/L (ref 3.5–5.1)
Sodium: 137 mEq/L (ref 135–145)
Total Bilirubin: 0.5 mg/dL (ref 0.2–1.2)
Total Protein: 7.6 g/dL (ref 6.0–8.3)

## 2020-08-21 LAB — CBC WITH DIFFERENTIAL/PLATELET
Basophils Absolute: 0 10*3/uL (ref 0.0–0.1)
Basophils Relative: 0.6 % (ref 0.0–3.0)
Eosinophils Absolute: 0.1 10*3/uL (ref 0.0–0.7)
Eosinophils Relative: 1.6 % (ref 0.0–5.0)
HCT: 38.8 % (ref 36.0–46.0)
Hemoglobin: 13 g/dL (ref 12.0–15.0)
Lymphocytes Relative: 26.7 % (ref 12.0–46.0)
Lymphs Abs: 1 10*3/uL (ref 0.7–4.0)
MCHC: 33.4 g/dL (ref 30.0–36.0)
MCV: 93.7 fl (ref 78.0–100.0)
Monocytes Absolute: 0.2 10*3/uL (ref 0.1–1.0)
Monocytes Relative: 4.2 % (ref 3.0–12.0)
Neutro Abs: 2.6 10*3/uL (ref 1.4–7.7)
Neutrophils Relative %: 66.9 % (ref 43.0–77.0)
Platelets: 328 10*3/uL (ref 150.0–400.0)
RBC: 4.14 Mil/uL (ref 3.87–5.11)
RDW: 12.5 % (ref 11.5–15.5)
WBC: 3.9 10*3/uL — ABNORMAL LOW (ref 4.0–10.5)

## 2020-08-21 LAB — LIPID PANEL
Cholesterol: 246 mg/dL — ABNORMAL HIGH (ref 0–200)
HDL: 72.2 mg/dL (ref >39.00)
LDL Cholesterol: 156 mg/dL — ABNORMAL HIGH (ref 0–99)
NonHDL: 174.18
Total CHOL/HDL Ratio: 3
Triglycerides: 93 mg/dL (ref 0.0–149.0)
VLDL: 18.6 mg/dL (ref 0.0–40.0)

## 2020-08-21 LAB — VITAMIN B12: Vitamin B-12: 955 pg/mL — ABNORMAL HIGH (ref 211–911)

## 2020-08-21 LAB — IRON: Iron: 131 ug/dL (ref 42–145)

## 2020-08-21 LAB — TROPONIN I (HIGH SENSITIVITY): High Sens Troponin I: 2 ng/L (ref 2–17)

## 2020-08-21 LAB — T4, FREE: Free T4: 0.94 ng/dL (ref 0.60–1.60)

## 2020-08-21 LAB — TSH: TSH: 1.5 u[IU]/mL (ref 0.35–4.50)

## 2020-08-21 NOTE — Patient Instructions (Addendum)
You have history of recent atypical transient type chest pain and also reports some dyspnea on exertion at times.  Your EKG today showed normal sinus rhythm.  No active current chest pain.  Last time had any discomfort was on Monday.  Decided to go ahead and get troponin I set stat.  We will see if this is elevated.  Not getting second set since no recent/recurrent chest pain.  Also will get a chest x-ray.  History of diabetes and hyperlipidemia with family history of MI.  I went ahead and placed cardiology referral.  If you had any recurrent chest pain that persist or significant then recommend ED evaluation pending appointment with cardiology.  For recent fatigue placed order for CMP, CBC, TSH, T4, B1, B12 and iron level.  On today's lab go ahead and get lipid panel as well.  On exam some mild thyromegaly so we will get TSH and T4.  Also ultrasound of thyroid was replaced.  Follow-up in 10 to 14 days or as needed.

## 2020-08-21 NOTE — Progress Notes (Signed)
Subjective:    Patient ID: Lisa Levine, female    DOB: 03-24-1976, 45 y.o.   MRN: [id number]  HPI   Pt in for states past month has been very stressed. Working long hours and at home.  Pt states having intermittent episodes of chest pain.  She gets tingling and transient pain at time.  Pt also past Friday past week was having some possible low level chest pain/discomfort and very fatigued.   This Monday had some transient chest pain and left side hand tingling. Since then no recurrent pain.  She notes also walking stairs in hospital makes her more short of breath than usual.   Pt is diabetic. Pt states if she has sugars 150-180 feels fine. If lower she feels week.  Pt has high cholesterol. She is on atorvstatin.    Review of Systems  Constitutional: Positive for fatigue. Negative for chills and fever.  HENT: Negative for congestion, drooling and ear pain.   Respiratory: Negative for cough, chest tightness, shortness of breath and wheezing.   Cardiovascular: Negative for chest pain and palpitations.       See hpi. No current chest pain presently.  Gastrointestinal: Negative for abdominal distention, blood in stool, constipation and diarrhea.  Musculoskeletal: Negative for back pain.  Skin: Negative for rash.  Neurological: Negative for dizziness, speech difficulty, weakness, numbness and headaches.  Hematological: Negative for adenopathy. Does not bruise/bleed easily.  Psychiatric/Behavioral: Negative for behavioral problems and confusion.     Past Medical History:  Diagnosis Date  . Allergy   . Arthritis   . DVT (deep venous thrombosis) ([place])    "often on my LLE" (06/04/2016)  . Hyperlipidemia   . Hypertension   . Migraine    "q couple months" (06/04/2016)  . Peripheral neuropathy   . Thyroid disease   . Type 1 diabetes mellitus ([place])   . Vitamin D deficiency      Social History   Socioeconomic History  . Marital status: Married    Spouse name: Not on file   . Number of children: 3  . Years of education: Not on file  . Highest education level: Not on file  Occupational History  . Occupation: [employer]  Tobacco Use  . Smoking status: Never Smoker  . Smokeless tobacco: Never Used  Substance and Sexual Activity  . Alcohol use: No  . Drug use: No  . Sexual activity: Yes  Other Topics Concern  . Not on file  Social History Narrative  . Not on file   Social Determinants of Health   Financial Resource Strain: Not on file  Food Insecurity: Not on file  Transportation Needs: Not on file  Physical Activity: Not on file  Stress: Not on file  Social Connections: Not on file  Intimate Partner Violence: Not on file    Past Surgical History:  Procedure Laterality Date  . insulin pump  2005-present   "upgrades q 3 yrs" (06/04/2016)    Family History  Problem Relation Age of Onset  . Breast cancer Mother   . Hyperlipidemia Father   . Hypertension Father   . Colon cancer Paternal Grandfather   . Lung cancer Paternal Uncle        smoler  . Liver disease Paternal Uncle     No Known Allergies  Current Outpatient Medications on File Prior to Visit  Medication Sig Dispense Refill  . acetaminophen (TYLENOL) 500 MG tablet Take 500 mg by mouth 3 (three) times daily as  needed for mild pain.    Marland Kitchen amitriptyline (ELAVIL) 10 MG tablet Take 1 tablet (10 mg total) by mouth at bedtime.    Marland Kitchen aspirin EC 81 MG EC tablet Take 1 tablet (81 mg total) by mouth daily. 30 tablet 0  . atorvastatin (LIPITOR) 10 MG tablet Take 1 tablet (10 mg total) by mouth daily. 30 tablet 3  . cephALEXin (KEFLEX) 500 MG capsule Take 1 capsule (500 mg total) by mouth 2 (two) times daily. 20 capsule 0  . cetirizine (ZYRTEC) 10 MG tablet Take 10 mg by mouth daily as needed for allergies.    . cyclobenzaprine (FLEXERIL) 5 MG tablet As needed  0  . diclofenac Sodium (VOLTAREN) 1 % GEL APPLY 4 GRAMS TOPICALLY 4 TIMES DAILY (Patient not taking: Reported on  08/02/2019) 100 g 0  . fluticasone (FLONASE) 50 MCG/ACT nasal spray SHAKE LIQUID AND USE 2 SPRAYS IN EACH NOSTRIL DAILY (Patient taking differently: SHAKE LIQUID AND USE 1-2 SPRAYS IN EACH NOSTRIL DAILY  AS NEEDED FOR ALLERGIES) 48 g 1  . gabapentin (NEURONTIN) 600 MG tablet Take 600 mg by mouth 4 (four) times daily.     . Insulin Human (INSULIN PUMP) SOLN Inject into the skin continuous. Use with Humalog - basal rate    . insulin NPH Human (HUMULIN N,NOVOLIN N) 100 UNIT/ML injection Inject 6-45 Units into the skin See admin instructions. Used withInsulin pump 35-45 units per day per basal rate...Marland KitchenMarland KitchenInject 6-12 units subcutaneously per sliding scale as needed for blood sugar correction with insulin pump    . meloxicam (MOBIC) 7.5 MG tablet TAKE 1 TO 2 TABLETS BY MOUTH TWICE DAILY AS NEEDED 30 tablet 0  . PAZEO 0.7 % SOLN Place 1 drop into both eyes daily as needed for allergies.  3  . pravastatin (PRAVACHOL) 20 MG tablet Take 20 mg by mouth daily.    . SUMAtriptan (IMITREX) 25 MG tablet Take 1 tablet (25 mg total) by mouth every 2 (two) hours as needed for migraine. May repeat in 2 hours if headache persists or recurs. 10 tablet 0  . tobramycin (TOBREX) 0.3 % ophthalmic ointment Place 1 application into both eyes 3 (three) times daily. 3.5 g 0  . traMADol (ULTRAM) 50 MG tablet Take 1 tablet (50 mg total) by mouth every 12 (twelve) hours as needed for severe pain. 8 tablet 0  . Vitamin D, Ergocalciferol, (DRISDOL) 50000 units CAPS capsule Take 1 capsule (50,000 Units total) by mouth every 7 (seven) days. 12 capsule 0   No current facility-administered medications on file prior to visit.    BP 132/76   Pulse 83   Temp 98.1 F (36.7 C) (Oral)   Ht 4\' 11"  (1.499 m)   Wt 114 lb 12.8 oz (52.1 kg)   SpO2 100%   BMI 23.19 kg/m       Objective:   Physical Exam  General Mental Status- Alert. General Appearance- Not in acute distress.   Skin General: Color- Normal Color. Moisture- Normal  Moisture.  Neck Carotid Arteries- Normal color. Moisture- Normal Moisture. No carotid bruits. No JVD. On palpation thyroid feels mild enlarged.  Chest and Lung Exam Auscultation: Breath Sounds:-Normal.  Cardiovascular Auscultation:Rythm- Regular. Murmurs & Other Heart Sounds:Auscultation of the heart reveals- No Murmurs.  Abdomen Inspection:-Inspeection Normal. Palpation/Percussion:Note:No mass. Palpation and Percussion of the abdomen reveal- Non Tender, Non Distended + BS, no rebound or guarding.   Neurologic Cranial Nerve exam:- CN III-XII intact(No nystagmus), symmetric smile. Strength:- 5/5 equal and symmetric strength  both upper and lower extremities.  Anterior thorax-no costochondral junction tenderness to palpation.    Assessment & Plan:  You have history of recent atypical transient type chest pain and also reports some dyspnea on exertion at times.  Your EKG today showed normal sinus rhythm.  No active current chest pain.  Last time had any discomfort was on Monday.  Decided to go ahead and get troponin I set stat.  We will see if this is elevated.  Not getting second set since no recent/recurrent chest pain.  Also will get a chest x-ray.  History of diabetes and hyperlipidemia with family history of MI.  I went ahead and placed cardiology referral.  If you had any recurrent chest pain that persist or significant then recommend ED evaluation pending appointment with cardiology.  For recent fatigue placed order for CMP, CBC, TSH, T4, B1, B12 and iron level.  On today's lab go ahead and get lipid panel as well.  On exam some mild thyromegaly so we will get TSH and T4.  Also ultrasound of thyroid was replaced.  Follow-up in 10 to 14 days or as needed.  Time spent with patient today was 40  minutes which consisted of chart revdiew, discussing diagnosis, work up treatment and documentation.

## 2020-08-25 ENCOUNTER — Ambulatory Visit (HOSPITAL_BASED_OUTPATIENT_CLINIC_OR_DEPARTMENT_OTHER)
Admission: RE | Admit: 2020-08-25 | Discharge: 2020-08-25 | Disposition: A | Discharge status: Home / Self Care | Payer: Medicare Other | Source: Ambulatory Visit | Attending: Medical | Admitting: Medical

## 2020-08-25 ENCOUNTER — Other Ambulatory Visit: Payer: Self-pay

## 2020-08-25 ENCOUNTER — Telehealth: Payer: Self-pay

## 2020-08-25 DIAGNOSIS — R0600 Dyspnea, unspecified: Secondary | ICD-10-CM

## 2020-08-25 DIAGNOSIS — E010 Iodine-deficiency related diffuse (endemic) goiter: Secondary | ICD-10-CM | POA: Insufficient documentation

## 2020-08-25 DIAGNOSIS — R0609 Other forms of dyspnea: Secondary | ICD-10-CM

## 2020-08-25 NOTE — Telephone Encounter (Signed)
[facility] [name]) informing PCP patient has Dexcom device that she recently applied.  [name] states they typically have patient remove device prior to imaging or reschedule imaging to day of new application. Spoke with patient, she request to complete imaging today. She will monitor glucose levels and report any abnormal results. This was suggested and approved by Radiology.

## 2020-08-26 DIAGNOSIS — E109 Type 1 diabetes mellitus without complications: Secondary | ICD-10-CM | POA: Insufficient documentation

## 2020-08-26 DIAGNOSIS — G629 Polyneuropathy, unspecified: Secondary | ICD-10-CM | POA: Insufficient documentation

## 2020-08-26 DIAGNOSIS — T7840XA Allergy, unspecified, initial encounter: Secondary | ICD-10-CM | POA: Insufficient documentation

## 2020-08-26 DIAGNOSIS — I82409 Acute embolism and thrombosis of unspecified deep veins of unspecified lower extremity: Secondary | ICD-10-CM | POA: Insufficient documentation

## 2020-08-26 DIAGNOSIS — E559 Vitamin D deficiency, unspecified: Secondary | ICD-10-CM | POA: Insufficient documentation

## 2020-08-26 DIAGNOSIS — I10 Essential (primary) hypertension: Secondary | ICD-10-CM | POA: Insufficient documentation

## 2020-08-26 DIAGNOSIS — E079 Disorder of thyroid, unspecified: Secondary | ICD-10-CM | POA: Insufficient documentation

## 2020-08-26 DIAGNOSIS — M1990 Unspecified osteoarthritis, unspecified site: Secondary | ICD-10-CM | POA: Insufficient documentation

## 2020-08-26 LAB — VITAMIN B1: Vitamin B1 (Thiamine): 11 nmol/L (ref 8–30)
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
## 2020-09-02 ENCOUNTER — Encounter: Payer: Self-pay | Admitting: Cardiology

## 2020-09-02 ENCOUNTER — Ambulatory Visit: Payer: Medicare Other | Admitting: Cardiology

## 2020-09-02 ENCOUNTER — Other Ambulatory Visit: Payer: Self-pay

## 2020-09-02 ENCOUNTER — Telehealth: Payer: Self-pay

## 2020-09-02 ENCOUNTER — Encounter (HOSPITAL_BASED_OUTPATIENT_CLINIC_OR_DEPARTMENT_OTHER): Payer: Self-pay

## 2020-09-02 ENCOUNTER — Ambulatory Visit (HOSPITAL_BASED_OUTPATIENT_CLINIC_OR_DEPARTMENT_OTHER)
Admission: RE | Admit: 2020-09-02 | Discharge: 2020-09-02 | Disposition: A | Discharge status: Home / Self Care | Payer: Medicare Other | Source: Ambulatory Visit | Attending: Cardiology | Admitting: Cardiology

## 2020-09-02 VITALS — BP 92/68 | HR 88 | Ht 59.0 in | Wt 115.0 lb

## 2020-09-02 DIAGNOSIS — R079 Chest pain, unspecified: Secondary | ICD-10-CM

## 2020-09-02 DIAGNOSIS — R0602 Shortness of breath: Secondary | ICD-10-CM

## 2020-09-02 MED ORDER — METOPROLOL TARTRATE 100 MG PO TABS: 100.0000 mg | ORAL_TABLET | Freq: Once | ORAL | 0 refills | Status: DC

## 2020-09-02 MED ORDER — IOHEXOL 350 MG/ML SOLN
100.0000 mL | Freq: Once | INTRAVENOUS | Status: AC | PRN
  Administered 2020-09-02: 100 mL via INTRAVENOUS

## 2020-09-02 NOTE — Telephone Encounter (Signed)
Spoke with patient regarding results and recommendation.  Patient verbalizes understanding and is agreeable to plan of care. Advised patient to call back with any issues or concerns.   Your cardiac CT will be scheduled at the below location:   [facility] [address] [place], [place] [address] [phone]  If scheduled at [facility], please arrive at the [facility] main entrance (entrance A) of [facility] 30 minutes prior to test start time. Proceed to the [facility] Radiology Department (first floor) to check-in and test prep.  Please follow these instructions carefully (unless otherwise directed):   On the Night Before the Test: . Be sure to Drink plenty of water. . Do not consume any caffeinated/decaffeinated beverages or chocolate 12 hours prior to your test. . Do not take any antihistamines 12 hours prior to your test.  On the Day of the Test: . Drink plenty of water until 1 hour prior to the test. . Do not eat any food 4 hours prior to the test. . You may take your regular medications prior to the test.  . Take metoprolol (Lopressor) two hours prior to test. . FEMALES- please wear underwire-free bra if available      After the Test: . Drink plenty of water. . After receiving IV contrast, you may experience a mild flushed feeling. This is normal. . On occasion, you may experience a mild rash up to 24 hours after the test. This is not dangerous. If this occurs, you can take Benadryl 25 mg and increase your fluid intake. . If you experience trouble breathing, this can be serious. If it is severe call 911 IMMEDIATELY. If it is mild, please call our office. . If you take any of these medications: Glipizide/Metformin, Avandament, Glucavance, please do not take 48 hours after completing test unless otherwise instructed.   Once we have confirmed authorization from your insurance company, we will call you to set up a date and time for your test.  Based on how quickly your insurance processes prior authorizations requests, please allow up to 4 weeks to be contacted for scheduling your Cardiac CT appointment. Be advised that routine Cardiac CT appointments could be scheduled as many as 8 weeks after your provider has ordered it.  For non-scheduling related questions, please contact the cardiac imaging nurse navigator should you have any questions/concerns: Marchia Bond, Cardiac Imaging Nurse Navigator Gordy Clement, Cardiac Imaging Nurse Navigator [facility] Direct Office Dial: [phone]   For scheduling needs, including cancellations and rescheduling, please call [place], [phone].

## 2020-09-02 NOTE — Patient Instructions (Addendum)
Medication Instructions:  Your physician recommends that you continue on your current medications as directed. Please refer to the Current Medication list given to you today.  *If you need a refill on your cardiac medications before your next appointment, please call your pharmacy*   Lab Work: None If you have labs (blood work) drawn today and your tests are completely normal, you will receive your results only by: Marland Kitchen MyChart Message (if you have MyChart) OR . A paper copy in the mail If you have any lab test that is abnormal or we need to change your treatment, we will call you to review the results.   Testing/Procedures: Your physician has requested that you have an echocardiogram. Echocardiography is a painless test that uses sound waves to create images of your heart. It provides your doctor with information about the size and shape of your heart and how well your heart's chambers and valves are working. This procedure takes approximately one hour. There are no restrictions for this procedure.  We have put in the order for you to have a chest CTA done. Please go downstairs to have this done now.    Follow-Up: At [facility], you and your health needs are our priority.  As part of our continuing mission to provide you with exceptional heart care, we have created designated Provider Care Teams.  These Care Teams include your primary Cardiologist (physician) and Advanced Practice Providers (APPs -  Physician Assistants and Nurse Practitioners) who all work together to provide you with the care you need, when you need it.  We recommend signing up for the patient portal called "MyChart".  Sign up information is provided on this After Visit Summary.  MyChart is used to connect with patients for Virtual Visits (Telemedicine).  Patients are able to view lab/test results, encounter notes, upcoming appointments, etc.  Non-urgent messages can be sent to your provider as well.   To learn more about  what you can do with MyChart, go to [email or web address].    Your next appointment:   4 week(s)  The format for your next appointment:   In Person  Provider:   Shirlee More, MD or any other provider.    Other Instructions

## 2020-09-02 NOTE — Telephone Encounter (Signed)
-----   Message from Lisa Priest, MD sent at 09/02/2020  4:36 PM EDT ----- Doristine Devoid result I am pleased CTA is normal no findings to show that she has had clot to her lung pulmonary embolism and its a normal test.  The cardiac structures that are visualized are also normal.  I think we should go ahead and schedule her for cardiac CTA to look at the coronary arteries.

## 2020-09-03 ENCOUNTER — Other Ambulatory Visit: Payer: Self-pay | Admitting: Cardiology

## 2020-09-15 ENCOUNTER — Telehealth (HOSPITAL_COMMUNITY): Payer: Self-pay | Admitting: Emergency Medicine

## 2020-09-15 NOTE — Telephone Encounter (Signed)
Pt wishing to postpone CCTA. States she thought she had this test a few weeks ago but I explained to her that it was a PE study and not a coronary CTA.   I also explained to her that she would need to take 100mg  metoprolol for HR control and NTG for vasodilation and she was concerned about how it would affect her.   She also asked about pre-auth and I explained that her insurance company did not require an auth for scheduling.  She wants to discuss with [facility] before proceeding.  Marchia Bond RN Navigator Cardiac Imaging [facility] Vascular Services [phone] Office  [phone] Cell

## 2020-09-17 ENCOUNTER — Ambulatory Visit (HOSPITAL_COMMUNITY): Admission: RE | Admit: 2020-09-17 | Payer: Medicare Other | Source: Ambulatory Visit

## 2020-10-01 ENCOUNTER — Other Ambulatory Visit: Payer: Self-pay

## 2020-10-01 ENCOUNTER — Encounter: Payer: Self-pay | Admitting: Cardiology

## 2020-10-01 ENCOUNTER — Ambulatory Visit: Payer: Medicare Other | Admitting: Cardiology

## 2020-10-01 VITALS — BP 110/72 | HR 68 | Ht 59.0 in | Wt 114.0 lb

## 2020-10-01 DIAGNOSIS — R0602 Shortness of breath: Secondary | ICD-10-CM

## 2020-10-01 DIAGNOSIS — E785 Hyperlipidemia, unspecified: Secondary | ICD-10-CM | POA: Diagnosis not present

## 2020-10-01 DIAGNOSIS — R079 Chest pain, unspecified: Secondary | ICD-10-CM | POA: Diagnosis not present

## 2020-10-01 DIAGNOSIS — E109 Type 1 diabetes mellitus without complications: Secondary | ICD-10-CM

## 2020-10-01 DIAGNOSIS — I10 Essential (primary) hypertension: Secondary | ICD-10-CM

## 2020-10-01 MED ORDER — METOPROLOL TARTRATE 100 MG PO TABS: 100.0000 mg | ORAL_TABLET | Freq: Once | ORAL | 0 refills | Status: DC

## 2020-10-01 MED ORDER — ASPIRIN EC 81 MG PO TBEC: 81.0000 mg | DELAYED_RELEASE_TABLET | Freq: Every day | ORAL | 3 refills | Status: DC

## 2020-10-01 NOTE — Patient Instructions (Addendum)
Medication Instructions:  Your physician has recommended you make the following change in your medication:  START: Aspirin 81 mg take one tablet by mouth daily.  *If you need a refill on your cardiac medications before your next appointment, please call your pharmacy*   Lab Work: None If you have labs (blood work) drawn today and your tests are completely normal, you will receive your results only by: Marland Kitchen MyChart Message (if you have MyChart) OR . A paper copy in the mail If you have any lab test that is abnormal or we need to change your treatment, we will call you to review the results.   Testing/Procedures: Your cardiac CT will be scheduled at the below location:   [facility] [address] [place], [place] [address] [phone]  If scheduled at [facility], please arrive at the [facility] main entrance (entrance A) of [facility] 30 minutes prior to test start time. Proceed to the [facility] Radiology Department (first floor) to check-in and test prep.  Please follow these instructions carefully (unless otherwise directed):  On the Night Before the Test: . Be sure to Drink plenty of water. . Do not consume any caffeinated/decaffeinated beverages or chocolate 12 hours prior to your test. . Do not take any antihistamines 12 hours prior to your test.  On the Day of the Test: . Drink plenty of water until 1 hour prior to the test. . Do not eat any food 4 hours prior to the test. . You may take your regular medications prior to the test.  . Take metoprolol (Lopressor) two hours prior to test. . FEMALES- please wear underwire-free bra if available  After the Test: . Drink plenty of water. . After receiving IV contrast, you may experience a mild flushed feeling. This is normal. . On occasion, you may experience a mild rash up to 24 hours after the test. This is not dangerous. If this occurs, you can take Benadryl 25 mg and increase your fluid  intake. . If you experience trouble breathing, this can be serious. If it is severe call 911 IMMEDIATELY. If it is mild, please call our office. . If you take any of these medications: Glipizide/Metformin, Avandament, Glucavance, please do not take 48 hours after completing test unless otherwise instructed.   Once we have confirmed authorization from your insurance company, we will call you to set up a date and time for your test. Based on how quickly your insurance processes prior authorizations requests, please allow up to 4 weeks to be contacted for scheduling your Cardiac CT appointment. Be advised that routine Cardiac CT appointments could be scheduled as many as 8 weeks after your provider has ordered it.  For non-scheduling related questions, please contact the cardiac imaging nurse navigator should you have any questions/concerns: Marchia Bond, Cardiac Imaging Nurse Navigator Gordy Clement, Cardiac Imaging Nurse Navigator [facility] Direct Office Dial: [phone]   For scheduling needs, including cancellations and rescheduling, please call [place], [phone].     Follow-Up: At [facility], you and your health needs are our priority.  As part of our continuing mission to provide you with exceptional heart care, we have created designated Provider Care Teams.  These Care Teams include your primary Cardiologist (physician) and Advanced Practice Providers (APPs -  Physician Assistants and Nurse Practitioners) who all work together to provide you with the care you need, when you need it.  We recommend signing up for the  patient portal called "MyChart".  Sign up information is provided on this After Visit Summary.  MyChart is used to connect with patients for Virtual Visits (Telemedicine).  Patients are able to view lab/test results, encounter notes, upcoming appointments, etc.  Non-urgent messages can be sent to your provider as well.   To learn more  about what you can do with MyChart, go to [email or web address].    Your next appointment:   8 week(s)  The format for your next appointment:   In Person  Provider:   Shirlee More, MD   Other Instructions
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
## 2020-10-15 ENCOUNTER — Ambulatory Visit (INDEPENDENT_AMBULATORY_CARE_PROVIDER_SITE_OTHER): Payer: Medicare Other

## 2020-10-15 ENCOUNTER — Other Ambulatory Visit: Payer: Self-pay

## 2020-10-15 DIAGNOSIS — R0602 Shortness of breath: Secondary | ICD-10-CM | POA: Diagnosis not present

## 2020-10-15 LAB — ECHOCARDIOGRAM COMPLETE
Area-P 1/2: 4.41 cm2
S' Lateral: 2.6 cm

## 2020-10-15 NOTE — Progress Notes (Signed)
Complete echocardiogram performed.  Jimmy Johnnetta Holstine RDCS, RVT  

## 2020-10-16 ENCOUNTER — Telehealth: Payer: Self-pay

## 2020-10-16 NOTE — Telephone Encounter (Signed)
Spoke with patient regarding results and recommendation.  Patient verbalizes understanding and is agreeable to plan of care. Advised patient to call back with any issues or concerns.  

## 2020-10-16 NOTE — Telephone Encounter (Signed)
-----   Message from Richardo Priest, MD sent at 10/15/2020  5:57 PM EDT ----- Echocardiogram looks very good She is scheduled for cardiac CTA June 6 I think she falls in the category that should be done even during shortage because of her high risk with type 1 diabetes and chest pain.

## 2020-10-23 ENCOUNTER — Other Ambulatory Visit: Payer: Self-pay | Admitting: Cardiology

## 2020-10-23 ENCOUNTER — Other Ambulatory Visit: Payer: Self-pay

## 2020-10-23 DIAGNOSIS — I10 Essential (primary) hypertension: Secondary | ICD-10-CM

## 2020-10-24 ENCOUNTER — Telehealth (HOSPITAL_COMMUNITY): Payer: Self-pay | Admitting: Emergency Medicine

## 2020-10-24 LAB — BASIC METABOLIC PANEL
BUN/Creatinine Ratio: 15 (ref 9–23)
BUN: 9 mg/dL (ref 6–24)
CO2: 20 mmol/L (ref 20–29)
Calcium: 8.9 mg/dL (ref 8.7–10.2)
Chloride: 100 mmol/L (ref 96–106)
Creatinine, Ser: 0.59 mg/dL (ref 0.57–1.00)
Glucose: 164 mg/dL — ABNORMAL HIGH (ref 65–99)
Potassium: 3.9 mmol/L (ref 3.5–5.2)
Sodium: 137 mmol/L (ref 134–144)
eGFR: 114 mL/min/{1.73_m2} (ref >59)

## 2020-10-24 NOTE — Telephone Encounter (Signed)
Attempted to call patient regarding upcoming cardiac CT appointment. °Left message on voicemail with name and callback number °Nohemy Koop RN Navigator Cardiac Imaging °[facility] °[phone] Office °[phone] Cell ° °

## 2020-10-27 ENCOUNTER — Other Ambulatory Visit: Payer: Self-pay

## 2020-10-27 ENCOUNTER — Telehealth: Payer: Self-pay

## 2020-10-27 ENCOUNTER — Ambulatory Visit (HOSPITAL_COMMUNITY)
Admission: RE | Admit: 2020-10-27 | Discharge: 2020-10-27 | Disposition: A | Discharge status: Home / Self Care | Payer: Medicare Other | Source: Ambulatory Visit | Attending: Cardiology | Admitting: Cardiology

## 2020-10-27 ENCOUNTER — Encounter (HOSPITAL_COMMUNITY): Payer: Self-pay

## 2020-10-27 DIAGNOSIS — E109 Type 1 diabetes mellitus without complications: Secondary | ICD-10-CM | POA: Insufficient documentation

## 2020-10-27 DIAGNOSIS — R079 Chest pain, unspecified: Secondary | ICD-10-CM | POA: Diagnosis not present

## 2020-10-27 DIAGNOSIS — R072 Precordial pain: Secondary | ICD-10-CM | POA: Insufficient documentation

## 2020-10-27 DIAGNOSIS — Z86718 Personal history of other venous thrombosis and embolism: Secondary | ICD-10-CM | POA: Diagnosis not present

## 2020-10-27 DIAGNOSIS — D6869 Other thrombophilia: Secondary | ICD-10-CM | POA: Insufficient documentation

## 2020-10-27 MED ORDER — IOHEXOL 350 MG/ML SOLN
95.0000 mL | Freq: Once | INTRAVENOUS | Status: AC | PRN
  Administered 2020-10-27: 95 mL via INTRAVENOUS

## 2020-10-27 MED ORDER — NITROGLYCERIN 0.4 MG SL SUBL: 0.4000 mg | SUBLINGUAL_TABLET | SUBLINGUAL | Status: DC | PRN

## 2020-10-27 MED ORDER — NITROGLYCERIN 0.4 MG SL SUBL
0.4000 mg | SUBLINGUAL_TABLET | Freq: Once | SUBLINGUAL | Status: AC
  Administered 2020-10-27: 0.4 mg via SUBLINGUAL

## 2020-10-27 MED ORDER — NITROGLYCERIN 0.4 MG SL SUBL
SUBLINGUAL_TABLET | SUBLINGUAL | Status: AC
  Filled 2020-10-27: qty 1

## 2020-10-27 NOTE — Telephone Encounter (Signed)
Left message on patients voicemail to please return our call.   

## 2020-10-27 NOTE — Telephone Encounter (Signed)
-----   Message from Richardo Priest, MD sent at 10/27/2020  8:01 AM EDT ----- Stable for cardiac CTA

## 2020-10-29 LAB — HM PAP SMEAR: HM Pap smear: NEGATIVE

## 2020-11-02 ENCOUNTER — Other Ambulatory Visit: Payer: Self-pay | Admitting: Cardiology

## 2020-12-23 ENCOUNTER — Ambulatory Visit: Payer: Medicare Other | Admitting: Cardiology

## 2021-04-23 ENCOUNTER — Ambulatory Visit (INDEPENDENT_AMBULATORY_CARE_PROVIDER_SITE_OTHER): Payer: Medicare Other | Admitting: Medical

## 2021-04-23 VITALS — BP 120/59 | HR 64 | Temp 98.1°F | Resp 18 | Ht 59.0 in | Wt 117.4 lb

## 2021-04-23 DIAGNOSIS — E785 Hyperlipidemia, unspecified: Secondary | ICD-10-CM | POA: Diagnosis not present

## 2021-04-23 DIAGNOSIS — E109 Type 1 diabetes mellitus without complications: Secondary | ICD-10-CM | POA: Diagnosis not present

## 2021-04-23 DIAGNOSIS — E559 Vitamin D deficiency, unspecified: Secondary | ICD-10-CM

## 2021-04-23 DIAGNOSIS — E1049 Type 1 diabetes mellitus with other diabetic neurological complication: Secondary | ICD-10-CM | POA: Diagnosis not present

## 2021-04-23 DIAGNOSIS — G6289 Other specified polyneuropathies: Secondary | ICD-10-CM

## 2021-04-23 LAB — LIPID PANEL
Cholesterol: 257 mg/dL — ABNORMAL HIGH (ref 0–200)
HDL: 82 mg/dL (ref >39.00)
LDL Cholesterol: 160 mg/dL — ABNORMAL HIGH (ref 0–99)
NonHDL: 174.9
Total CHOL/HDL Ratio: 3
Triglycerides: 74 mg/dL (ref 0.0–149.0)
VLDL: 14.8 mg/dL (ref 0.0–40.0)

## 2021-04-23 LAB — COMPREHENSIVE METABOLIC PANEL
ALT: 10 U/L (ref 0–35)
AST: 17 U/L (ref 0–37)
Albumin: 4.2 g/dL (ref 3.5–5.2)
Alkaline Phosphatase: 50 U/L (ref 39–117)
BUN: 13 mg/dL (ref 6–23)
CO2: 31 mEq/L (ref 19–32)
Calcium: 9.1 mg/dL (ref 8.4–10.5)
Chloride: 101 mEq/L (ref 96–112)
Creatinine, Ser: 0.67 mg/dL (ref 0.40–1.20)
GFR: 105.95 mL/min (ref >60.00)
Glucose, Bld: 153 mg/dL — ABNORMAL HIGH (ref 70–99)
Potassium: 4.3 mEq/L (ref 3.5–5.1)
Sodium: 138 mEq/L (ref 135–145)
Total Bilirubin: 0.6 mg/dL (ref 0.2–1.2)
Total Protein: 7.7 g/dL (ref 6.0–8.3)

## 2021-04-23 LAB — VITAMIN B12: Vitamin B-12: 1016 pg/mL — ABNORMAL HIGH (ref 211–911)

## 2021-04-23 MED ORDER — ATORVASTATIN CALCIUM 10 MG PO TABS: ORAL_TABLET | ORAL | 3 refills | Status: DC

## 2021-04-23 NOTE — Progress Notes (Signed)
Subjective:    Patient ID: Lisa Levine, female    DOB: [birth year], 45 y.o.   MRN: [id number]  HPI Pt in for follow up.  Pt is diabetic and got message that she was not filling statin.   Wanted to discuss with pt as pharmacy sent me a message that she was not filling the statin. Pt has some chest pain in past and saw cardiologist. They did CT coronary.  Her score was   IMPRESSION: 1. Coronary calcium score of 0. This was 0 percentile for age-, sex, and race-matched controls.   2. Normal coronary origin with right dominance.   3. Normal coronary arteries.   RECOMMENDATIONS: 1. CAD-RADS 0: No evidence of CAD (0%). Consider non-atherosclerotic causes of chest pain.  Pt has lower ext neuropathy. She used to use gabapentin made her feel tired. She is only taking twice a day now and restarted b12 oral.   Pt sees endocrinologist. Last a1c 7.4    Review of Systems  Constitutional:  Negative for chills, fatigue and fever.  HENT:  Negative for congestion and drooling.   Respiratory:  Negative for cough, chest tightness, shortness of breath and wheezing.   Cardiovascular:  Negative for chest pain and palpitations.  Gastrointestinal:  Positive for constipation. Negative for abdominal pain, diarrhea and vomiting.       Bm pattern every day but when uses vit d will go every 2-3 days.  Musculoskeletal:  Negative for back pain and joint swelling.  Skin:  Negative for rash.  Hematological:  Negative for adenopathy. Does not bruise/bleed easily.  Psychiatric/Behavioral:  Negative for behavioral problems, confusion and sleep disturbance. The patient is not nervous/anxious.    Past Medical History:  Diagnosis Date   Allergy    Arthritis    DVT (deep venous thrombosis) ([place])    "often on my LLE" (06/04/2016)   Hyperlipidemia    Hypertension    Migraine    "q couple months" (06/04/2016)   Peripheral neuropathy    Thyroid disease    Type 1 diabetes mellitus (HCC)    Vitamin D deficiency       Social History   Socioeconomic History   Marital status: Married    Spouse name: Not on file   Number of children: 3   Years of education: Not on file   Highest education level: Not on file  Occupational History   Occupation: [employer]  Tobacco Use   Smoking status: Never   Smokeless tobacco: Never  Substance and Sexual Activity   Alcohol use: No   Drug use: No   Sexual activity: Yes  Other Topics Concern   Not on file  Social History Narrative   Not on file   Social Determinants of Health   Financial Resource Strain: Not on file  Food Insecurity: Not on file  Transportation Needs: Not on file  Physical Activity: Not on file  Stress: Not on file  Social Connections: Not on file  Intimate Partner Violence: Not on file    Past Surgical History:  Procedure Laterality Date   insulin pump  2005-present   "upgrades q 3 yrs" (06/04/2016)    Family History  Problem Relation Age of Onset   Breast cancer Mother    Hyperlipidemia Father    Hypertension Father    Colon cancer Paternal Grandfather    Lung cancer Paternal Uncle        smoler   Liver disease Paternal Uncle     No  Known Allergies  Current Outpatient Medications on File Prior to Visit  Medication Sig Dispense Refill   acetaminophen (TYLENOL) 500 MG tablet Take 500 mg by mouth 3 (three) times daily as needed for mild pain.     amitriptyline (ELAVIL) 10 MG tablet Take 1 tablet (10 mg total) by mouth at bedtime.     aspirin EC 81 MG tablet Take 1 tablet (81 mg total) by mouth daily. Swallow whole. 90 tablet 3   atorvastatin (LIPITOR) 10 MG tablet Take 1 tablet (10 mg total) by mouth daily. 30 tablet 3   cetirizine (ZYRTEC) 10 MG tablet Take 10 mg by mouth daily as needed for allergies.     diclofenac Sodium (VOLTAREN) 1 % GEL APPLY 4 GRAMS TOPICALLY 4 TIMES DAILY 100 g 0   fluticasone (FLONASE) 50 MCG/ACT nasal spray SHAKE LIQUID AND USE 2 SPRAYS IN EACH NOSTRIL DAILY 48 g 1   gabapentin  (NEURONTIN) 600 MG tablet Take 600 mg by mouth 4 (four) times daily.      insulin aspart (NOVOLOG) 100 UNIT/ML injection Use as needed in insulin pump.  MDD: 66 units/day     Insulin Human (INSULIN PUMP) SOLN Inject into the skin continuous. Use with Humalog - basal rate     insulin NPH Human (HUMULIN N,NOVOLIN N) 100 UNIT/ML injection Inject 6-45 Units into the skin See admin instructions. Used withInsulin pump 35-45 units per day per basal rate...Marland KitchenMarland KitchenInject 6-12 units subcutaneously per sliding scale as needed for blood sugar correction with insulin pump     PAZEO 0.7 % SOLN Place 1 drop into both eyes daily as needed for allergies.  3   Vitamin D, Ergocalciferol, (DRISDOL) 50000 units CAPS capsule Take 1 capsule (50,000 Units total) by mouth every 7 (seven) days. 12 capsule 0   metoprolol tartrate (LOPRESSOR) 100 MG tablet Take 1 tablet (100 mg total) by mouth once for 1 dose. Take two hours prior to your cardiac CT 1 tablet 0   No current facility-administered medications on file prior to visit.    BP (!) 120/59   Pulse 64   Temp 98.1 F (36.7 C)   Resp 18   Ht 4\' 11"  (1.499 m)   Wt 117 lb 6.4 oz (53.3 kg)   SpO2 100%   BMI 23.71 kg/m        Objective:   Physical Exam  General Mental Status- Alert. General Appearance- Not in acute distress.   Skin General: Color- Normal Color. Moisture- Normal Moisture.  Neck Carotid Arteries- Normal color. Moisture- Normal Moisture. No carotid bruits. No JVD.  Chest and Lung Exam Auscultation: Breath Sounds:-Normal.  Cardiovascular Auscultation:Rythm- Regular. Murmurs & Other Heart Sounds:Auscultation of the heart reveals- No Murmurs.  Abdomen Inspection:-Inspeection Normal. Palpation/Percussion:Note:No mass. Palpation and Percussion of the abdomen reveal- Non Tender, Non Distended + BS, no rebound or guarding.   Neurologic Cranial Nerve exam:- CN III-XII intact(No nystagmus), symmetric smile. Strength:- 5/5 equal and symmetric  strength both upper and lower extremities.       Assessment & Plan:   Patient Instructions  For diabetes continue insulin prescribed by endocrinologist.  For high cholesterol rx low dose atorvastatin 10 mg to use 3 days a week. Your ct coronary is clear but type 1 diabetes and statins strongly recommended. I thinks reasonable to just use 3 times weekly since you have some hesitancy  on statin use and ct study clear presently.  For neuropathy continue gabapentin. Get b12 level today.   Get lipid panel and  cmp today.  Follow up date to be determined after lab review.   Mackie Pai, PA-C

## 2021-04-23 NOTE — Patient Instructions (Addendum)
For diabetes continue insulin prescribed by endocrinologist.  For high cholesterol rx low dose atorvastatin 10 mg to use 3 days a week. Your ct coronary is clear but type 1 diabetes and statins strongly recommended. I thinks reasonable to just use 3 times weekly since you have some hesitancy  on statin use and ct study clear presently.  For neuropathy continue gabapentin. Get b12 level today.   Get lipid panel and cmp today.  For constipation can continue miralax or dulcolax.  For low vit D will check level today.  Follow up date to be determined after lab review.

## 2021-04-27 LAB — VITAMIN D 1,25 DIHYDROXY
Vitamin D 1, 25 (OH)2 Total: 51 pg/mL (ref 18–72)
Vitamin D2 1, 25 (OH)2: <8 pg/mL
Vitamin D3 1, 25 (OH)2: 51 pg/mL

## 2021-07-20 DIAGNOSIS — D485 Neoplasm of uncertain behavior of skin: Secondary | ICD-10-CM | POA: Diagnosis not present

## 2021-07-27 ENCOUNTER — Ambulatory Visit: Payer: Medicare Other | Admitting: Medical

## 2021-08-14 DIAGNOSIS — Z4681 Encounter for fitting and adjustment of insulin pump: Secondary | ICD-10-CM | POA: Diagnosis not present

## 2021-08-14 DIAGNOSIS — E559 Vitamin D deficiency, unspecified: Secondary | ICD-10-CM | POA: Diagnosis not present

## 2021-08-14 DIAGNOSIS — Z9641 Presence of insulin pump (external) (internal): Secondary | ICD-10-CM | POA: Diagnosis not present

## 2021-08-14 DIAGNOSIS — E1042 Type 1 diabetes mellitus with diabetic polyneuropathy: Secondary | ICD-10-CM | POA: Diagnosis not present

## 2021-08-20 ENCOUNTER — Telehealth: Payer: Self-pay | Admitting: Medical

## 2021-08-20 NOTE — Telephone Encounter (Signed)
Spoke to patient to schedule Medicare Annual Wellness Visit (AWV)  ? ?She stated she is having problems with her insurance and wanted to wait to schedule ? ? ?AWVI DUE 11/22/2010 PER [place] ?please schedule at anytime with health coach ? ?This should be a 45 minute visit.  ?

## 2021-09-30 ENCOUNTER — Encounter: Payer: Self-pay | Admitting: *Deleted

## 2021-09-30 ENCOUNTER — Ambulatory Visit (INDEPENDENT_AMBULATORY_CARE_PROVIDER_SITE_OTHER): Payer: Medicare Other | Admitting: Medical

## 2021-09-30 VITALS — BP 125/50 | HR 64 | Temp 98.1°F | Resp 18 | Ht 59.0 in | Wt 120.0 lb

## 2021-09-30 DIAGNOSIS — Z0000 Encounter for general adult medical examination without abnormal findings: Secondary | ICD-10-CM

## 2021-09-30 DIAGNOSIS — E559 Vitamin D deficiency, unspecified: Secondary | ICD-10-CM | POA: Diagnosis not present

## 2021-09-30 DIAGNOSIS — Z1211 Encounter for screening for malignant neoplasm of colon: Secondary | ICD-10-CM

## 2021-09-30 DIAGNOSIS — E0843 Diabetes mellitus due to underlying condition with diabetic autonomic (poly)neuropathy: Secondary | ICD-10-CM

## 2021-09-30 NOTE — Progress Notes (Signed)
? ?Subjective:  ? ? Patient ID: Lisa Levine, female    DOB: 08/16/1975, 46 y.o.   MRN: [id number] ? ?HPI ? ?Wellness exam.  ? 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 Pt is exercising daily. Eating healthy overall. Low carb diet. Non smoker and no alcohol Caffeine 3 cups a day. ? ?Up to date on tdap. ?Up to date on pap. ? ?Diabetic eye exam coming up. ? ?In past pt failed lyrica and tramadol for neuropathy. She still has nerve pain despite 2400 mg total gabapebtib. ? ?Review of Systems  ?Constitutional:  Negative for chills, fatigue and fever.  ?HENT:  Negative for congestion, ear discharge, ear pain and facial swelling.   ?Respiratory:  Negative for cough, chest tightness, shortness of breath and wheezing.   ?Cardiovascular:  Negative for chest pain and palpitations.  ?Gastrointestinal:  Negative for abdominal distention, abdominal pain, blood in stool, constipation, rectal pain and vomiting.  ?Genitourinary:  Negative for dysuria and frequency.  ?Musculoskeletal:  Negative for back pain and joint swelling.  ?Neurological:  Negative for dizziness and headaches.  ?     Neuropathy getting worse  ?Hematological:  Negative for adenopathy.  ?Psychiatric/Behavioral:  Negative for behavioral problems and confusion.   ? ?Past Medical History:  ?Diagnosis Date  ? Allergy   ? Arthritis   ? DVT (deep venous thrombosis) ([place])   ? "often on my LLE" (06/04/2016)  ? Hyperlipidemia   ? Hypertension   ? Migraine   ? "q couple months" (06/04/2016)  ? Peripheral neuropathy   ? Thyroid disease   ? Type 1 diabetes mellitus ([place])   ? Vitamin D deficiency   ? ?  ?Social History  ? ?Socioeconomic History  ? Marital status: Married  ?  Spouse name: Not on file  ? Number of children: 3  ? Years of education: Not on file  ? Highest education level: Not on file  ?Occupational History  ? Occupation: [employer]  ?Tobacco Use  ? Smoking status: Never  ? Smokeless tobacco: Never  ?Substance and Sexual Activity  ? Alcohol use: No  ? Drug  use: No  ? Sexual activity: Yes  ?Other Topics Concern  ? Not on file  ?Social History Narrative  ? Not on file  ? ?Social Determinants of Health  ? ?Financial Resource Strain: Not on file  ?Food Insecurity: Not on file  ?Transportation Needs: Not on file  ?Physical Activity: Not on file  ?Stress: Not on file  ?Social Connections: Not on file  ?Intimate Partner Violence: Not on file  ? ? ?Past Surgical History:  ?Procedure Laterality Date  ? insulin pump  2005-present  ? "upgrades q 3 yrs" (06/04/2016)  ? ? ?Family History  ?Problem Relation Age of Onset  ? Breast cancer Mother   ? Hyperlipidemia Father   ? Hypertension Father   ? Colon cancer Paternal Grandfather   ? Lung cancer Paternal Uncle   ?     smoler  ? Liver disease Paternal Uncle   ? ? ?No Known Allergies ? ?Current Outpatient Medications on File Prior to Visit  ?Medication Sig Dispense Refill  ? acetaminophen (TYLENOL) 500 MG tablet Take 500 mg by mouth 3 (three) times daily as needed for mild pain.    ? amitriptyline (ELAVIL) 10 MG tablet Take 1 tablet (10 mg total) by mouth at bedtime.    ? aspirin EC 81 MG tablet Take 1 tablet (81 mg total) by mouth daily. Swallow whole. 90 tablet 3  ?  atorvastatin (LIPITOR) 10 MG tablet 1 tab po mon, wed and friday 36 tablet 3  ? cetirizine (ZYRTEC) 10 MG tablet Take 10 mg by mouth daily as needed for allergies.    ? diclofenac Sodium (VOLTAREN) 1 % GEL APPLY 4 GRAMS TOPICALLY 4 TIMES DAILY 100 g 0  ? fluticasone (FLONASE) 50 MCG/ACT nasal spray SHAKE LIQUID AND USE 2 SPRAYS IN EACH NOSTRIL DAILY 48 g 1  ? gabapentin (NEURONTIN) 600 MG tablet Take 600 mg by mouth 4 (four) times daily.     ? insulin aspart (NOVOLOG) 100 UNIT/ML injection Use as needed in insulin pump.  MDD: 66 units/day    ? Insulin Human (INSULIN PUMP) SOLN Inject into the skin continuous. Use with Humalog - basal rate    ? insulin NPH Human (HUMULIN N,NOVOLIN N) 100 UNIT/ML injection Inject 6-45 Units into the skin See admin instructions. Used  withInsulin pump 35-45 units per day per basal rate...Marland KitchenMarland KitchenInject 6-12 units subcutaneously per sliding scale as needed for blood sugar correction with insulin pump    ? PAZEO 0.7 % SOLN Place 1 drop into both eyes daily as needed for allergies.  3  ? Vitamin D, Ergocalciferol, (DRISDOL) 50000 units CAPS capsule Take 1 capsule (50,000 Units total) by mouth every 7 (seven) days. 12 capsule 0  ? metoprolol tartrate (LOPRESSOR) 100 MG tablet Take 1 tablet (100 mg total) by mouth once for 1 dose. Take two hours prior to your cardiac CT 1 tablet 0  ? ?No current facility-administered medications on file prior to visit.  ? ? ?BP (!) 125/50   Pulse 64   Temp 98.1 ?F (36.7 ?C)   Resp 18   Ht '4\' 11"'$  (1.499 m)   Wt 120 lb (54.4 kg)   SpO2 99%   BMI 24.24 kg/m?  ?  ?   ?Objective:  ? Physical Exam ? ?General ?Mental Status- Alert. General Appearance- Not in acute distress.  ? ?Skin ?General: Color- Normal Color. Moisture- Normal Moisture. ? ?Neck ?Carotid Arteries- Normal color. Moisture- Normal Moisture. No carotid bruits. No JVD. ? ?Chest and Lung Exam ?Auscultation: ?Breath Sounds:-Normal. ? ?Cardiovascular ?Auscultation:Rythm- Regular. ?Murmurs & Other Heart Sounds:Auscultation of the heart reveals- No Murmurs. ? ?Abdomen ?Inspection:-Inspeection Normal. ?Palpation/Percussion:Note:No mass. Palpation and Percussion of the abdomen reveal- Non Tender, Non Distended + BS, no rebound or guarding. ? ?Neurologic ?Cranial Nerve exam:- CN III-XII intact(No nystagmus), symmetric smile. ?Drift Test:- No drift. ?Romberg Exam:- Negative.  ?Heal to Toe Gait exam:-Normal. ?Finger to Nose:- Normal/Intact ?Strength:- 5/5 equal and symmetric strength both upper and lower extremities.  ? ? ?   ?Assessment & Plan:  ? ?Patient Instructions  ?For you wellness exam today I have ordered cbc, cmp and lipid panel. ? ?Vaccines up to date. ? ?Recommend exercise and healthy diet. ? ?We will let you know lab results as they come in. ? ?Follow up  one year or sooner if needed. ? ?Referral for screening colonoscopy placed. ? ?Discussed neuropathy today and med options. Continue gabapentin 600 mg four times daily and follow up with neurologist. ? ?Diabetes- recently a1c 7.4. continue with endocrinologist. ? ?Vit d def- get level. ? ? ? ? ? ? ? ? ? ? ? Mackie Pai, PA-C  ? ?[phone] charge as did address neuropathy and vit D deficiency. Counseled and discussed options for neuropathy with pt. Discussed reasoning on follow up with her neurologist. ?

## 2021-09-30 NOTE — Addendum Note (Signed)
Addended by: Kem Boroughs D on: 09/30/2021 03:31 PM ? ? Modules accepted: Orders ? ?

## 2021-09-30 NOTE — Patient Instructions (Addendum)
For you wellness exam today I have ordered cbc, cmp and lipid panel. ? ?Vaccines up to date. ? ?Recommend exercise and healthy diet. ? ?We will let you know lab results as they come in. ? ?Follow up one year or sooner if needed. ? ?Referral for screening colonoscopy placed. ? ?Discussed neuropathy today and med options. Continue gabapentin 600 mg four times daily and follow up with neurologist. ? ?Diabetes- recently a1c 7.4. continue with endocrinologist. ? ?Vit d def- get level. ? ?Preventive Care 63-48 Years Old, Female ?Preventive care refers to lifestyle choices and visits with your health care provider that can promote health and wellness. Preventive care visits are also called wellness exams. ?What can I expect for my preventive care visit? ?Counseling ?Your health care provider may ask you questions about your: ?Medical history, including: ?Past medical problems. ?Family medical history. ?Pregnancy history. ?Current health, including: ?Menstrual cycle. ?Method of birth control. ?Emotional well-being. ?Home life and relationship well-being. ?Sexual activity and sexual health. ?Lifestyle, including: ?Alcohol, nicotine or tobacco, and drug use. ?Access to firearms. ?Diet, exercise, and sleep habits. ?Work and work Statistician. ?Sunscreen use. ?Safety issues such as seatbelt and bike helmet use. ?Physical exam ?Your health care provider will check your: ?Height and weight. These may be used to calculate your BMI (body mass index). BMI is a measurement that tells if you are at a healthy weight. ?Waist circumference. This measures the distance around your waistline. This measurement also tells if you are at a healthy weight and may help predict your risk of certain diseases, such as type 2 diabetes and high blood pressure. ?Heart rate and blood pressure. ?Body temperature. ?Skin for abnormal spots. ?What immunizations do I need? ? ?Vaccines are usually given at various ages, according to a schedule. Your health  care provider will recommend vaccines for you based on your age, medical history, and lifestyle or other factors, such as travel or where you work. ?What tests do I need? ?Screening ?Your health care provider may recommend screening tests for certain conditions. This may include: ?Lipid and cholesterol levels. ?Diabetes screening. This is done by checking your blood sugar (glucose) after you have not eaten for a while (fasting). ?Pelvic exam and Pap test. ?Hepatitis B test. ?Hepatitis C test. ?HIV (human immunodeficiency virus) test. ?STI (sexually transmitted infection) testing, if you are at risk. ?Lung cancer screening. ?Colorectal cancer screening. ?Mammogram. Talk with your health care provider about when you should start having regular mammograms. This may depend on whether you have a family history of breast cancer. ?BRCA-related cancer screening. This may be done if you have a family history of breast, ovarian, tubal, or peritoneal cancers. ?Bone density scan. This is done to screen for osteoporosis. ?Talk with your health care provider about your test results, treatment options, and if necessary, the need for more tests. ?Follow these instructions at home: ?Eating and drinking ? ?Eat a diet that includes fresh fruits and vegetables, whole grains, lean protein, and low-fat dairy products. ?Take vitamin and mineral supplements as recommended by your health care provider. ?Do not drink alcohol if: ?Your health care provider tells you not to drink. ?You are pregnant, may be pregnant, or are planning to become pregnant. ?If you drink alcohol: ?Limit how much you have to 0-1 drink a day. ?Know how much alcohol is in your drink. In the [place] one drink equals one 12 oz bottle of beer (355 mL), one 5 oz glass of wine (148 mL), or one 1? oz glass of  hard liquor (44 mL). ?Lifestyle ?Brush your teeth every morning and night with fluoride toothpaste. Floss one time each day. ?Exercise for at least 30 minutes 5 or more  days each week. ?Do not use any products that contain nicotine or tobacco. These products include cigarettes, chewing tobacco, and vaping devices, such as e-cigarettes. If you need help quitting, ask your health care provider. ?Do not use drugs. ?If you are sexually active, practice safe sex. Use a condom or other form of protection to prevent STIs. ?If you do not wish to become pregnant, use a form of birth control. If you plan to become pregnant, see your health care provider for a prepregnancy visit. ?Take aspirin only as told by your health care provider. Make sure that you understand how much to take and what form to take. Work with your health care provider to find out whether it is safe and beneficial for you to take aspirin daily. ?Find healthy ways to manage stress, such as: ?Meditation, yoga, or listening to music. ?Journaling. ?Talking to a trusted person. ?Spending time with friends and family. ?Minimize exposure to UV radiation to reduce your risk of skin cancer. ?Safety ?Always wear your seat belt while driving or riding in a vehicle. ?Do not drive: ?If you have been drinking alcohol. Do not ride with someone who has been drinking. ?When you are tired or distracted. ?While texting. ?If you have been using any mind-altering substances or drugs. ?Wear a helmet and other protective equipment during sports activities. ?If you have firearms in your house, make sure you follow all gun safety procedures. ?Seek help if you have been physically or sexually abused. ?What's next? ?Visit your health care provider once a year for an annual wellness visit. ?Ask your health care provider how often you should have your eyes and teeth checked. ?Stay up to date on all vaccines. ?This information is not intended to replace advice given to you by your health care provider. Make sure you discuss any questions you have with your health care provider. ?Document Revised: 11/05/2020 Document Reviewed: 11/05/2020 ?Elsevier  Patient Education ? [place]. ? ? ? ? ? ? ? ? ? ? ? ? ?

## 2021-10-01 LAB — COMPREHENSIVE METABOLIC PANEL
ALT: 11 U/L (ref 0–35)
AST: 24 U/L (ref 0–37)
Albumin: 4.5 g/dL (ref 3.5–5.2)
Alkaline Phosphatase: 48 U/L (ref 39–117)
BUN: 14 mg/dL (ref 6–23)
CO2: 29 mEq/L (ref 19–32)
Calcium: 9.1 mg/dL (ref 8.4–10.5)
Chloride: 101 mEq/L (ref 96–112)
Creatinine, Ser: 0.76 mg/dL (ref 0.40–1.20)
GFR: 94.7 mL/min (ref >60.00)
Glucose, Bld: 190 mg/dL — ABNORMAL HIGH (ref 70–99)
Potassium: 4.8 mEq/L (ref 3.5–5.1)
Sodium: 138 mEq/L (ref 135–145)
Total Bilirubin: 0.6 mg/dL (ref 0.2–1.2)
Total Protein: 7.9 g/dL (ref 6.0–8.3)

## 2021-10-01 LAB — LIPID PANEL
Cholesterol: 258 mg/dL — ABNORMAL HIGH (ref 0–200)
HDL: 80.1 mg/dL (ref >39.00)
LDL Cholesterol: 164 mg/dL — ABNORMAL HIGH (ref 0–99)
NonHDL: 177.84
Total CHOL/HDL Ratio: 3
Triglycerides: 68 mg/dL (ref 0.0–149.0)
VLDL: 13.6 mg/dL (ref 0.0–40.0)

## 2021-10-01 LAB — VITAMIN D 25 HYDROXY (VIT D DEFICIENCY, FRACTURES): VITD: 18.54 ng/mL — ABNORMAL LOW (ref 30.00–100.00)

## 2021-10-03 MED ORDER — VITAMIN D (ERGOCALCIFEROL) 1.25 MG (50000 UNIT) PO CAPS: 50000.0000 [IU] | ORAL_CAPSULE | ORAL | 0 refills | Status: DC

## 2021-10-03 NOTE — Addendum Note (Signed)
Addended by: Anabel Halon on: 10/03/2021 01:23 PM ? ? Modules accepted: Orders ? ?

## 2021-10-05 ENCOUNTER — Encounter: Payer: Self-pay | Admitting: Medical

## 2021-10-05 MED ORDER — ATORVASTATIN CALCIUM 10 MG PO TABS: 10.0000 mg | ORAL_TABLET | Freq: Every day | ORAL | 3 refills | Status: AC

## 2021-10-05 NOTE — Addendum Note (Signed)
Addended by: Anabel Halon on: 10/05/2021 05:16 PM ? ? Modules accepted: Orders ? ?

## 2021-10-06 MED ORDER — LEVOCETIRIZINE DIHYDROCHLORIDE 5 MG PO TABS: 5.0000 mg | ORAL_TABLET | Freq: Every evening | ORAL | 3 refills | Status: AC

## 2021-10-06 NOTE — Addendum Note (Signed)
Addended by: Anabel Halon on: 10/06/2021 05:43 PM ? ? Modules accepted: Orders ? ?

## 2021-10-14 DIAGNOSIS — H5213 Myopia, bilateral: Secondary | ICD-10-CM | POA: Diagnosis not present

## 2021-11-04 DIAGNOSIS — Z01411 Encounter for gynecological examination (general) (routine) with abnormal findings: Secondary | ICD-10-CM | POA: Diagnosis not present

## 2021-11-04 DIAGNOSIS — B3731 Acute candidiasis of vulva and vagina: Secondary | ICD-10-CM | POA: Diagnosis not present

## 2021-11-04 DIAGNOSIS — N760 Acute vaginitis: Secondary | ICD-10-CM | POA: Diagnosis not present

## 2021-11-04 DIAGNOSIS — B9689 Other specified bacterial agents as the cause of diseases classified elsewhere: Secondary | ICD-10-CM | POA: Diagnosis not present

## 2021-11-17 DIAGNOSIS — H5213 Myopia, bilateral: Secondary | ICD-10-CM | POA: Diagnosis not present

## 2021-12-01 ENCOUNTER — Ambulatory Visit (INDEPENDENT_AMBULATORY_CARE_PROVIDER_SITE_OTHER): Payer: Medicare Other | Admitting: Medical

## 2021-12-01 VITALS — BP 126/70 | HR 70 | Resp 18 | Ht 59.0 in | Wt 122.0 lb

## 2021-12-01 DIAGNOSIS — M7712 Lateral epicondylitis, left elbow: Secondary | ICD-10-CM

## 2021-12-01 DIAGNOSIS — G5602 Carpal tunnel syndrome, left upper limb: Secondary | ICD-10-CM | POA: Diagnosis not present

## 2021-12-01 DIAGNOSIS — M25512 Pain in left shoulder: Secondary | ICD-10-CM | POA: Diagnosis not present

## 2021-12-01 NOTE — Progress Notes (Signed)
Subjective:    Patient ID: Lisa Levine, female    DOB: [birth year], 46 y.o.   MRN: [id number]  HPI Pt in with left arm/elbow pain.  She had hx of rt elbow pain in the past.   Recent left elbow pain for 2 months. She states pain is present if she does not wear brace. She states if does not wear brace will have a lot of pain. 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 In addition her left shoulder hurts and has limited range of motion of left shoulder.    Pt notes that when using ibuprofen recently for implant tooth pain she states helps shoulder, elbow and wrist.     Review of Systems  Constitutional:  Negative for chills, fatigue and fever.  Respiratory:  Negative for cough, chest tightness, shortness of breath and wheezing.   Cardiovascular:  Negative for chest pain and palpitations.  Musculoskeletal:  Negative for back pain.       See hpi.  Skin:  Negative for rash.  Neurological:  Negative for dizziness, speech difficulty, weakness, numbness and headaches.  Hematological:  Negative for adenopathy. Does not bruise/bleed easily.  Psychiatric/Behavioral:  Negative for behavioral problems, confusion, dysphoric mood, self-injury and suicidal ideas. The patient is not nervous/anxious.     Past Medical History:  Diagnosis Date   Allergy    Arthritis    DVT (deep venous thrombosis) ([place])    "often on my LLE" (06/04/2016)   Hyperlipidemia    Hypertension    Migraine    "q couple months" (06/04/2016)   Peripheral neuropathy    Thyroid disease    Type 1 diabetes mellitus ([facility])    Vitamin D deficiency      Social History   Socioeconomic History   Marital status: Married    Spouse name: Not on file   Number of children: 3   Years of education: Not on file   Highest education level: Not on file  Occupational History   Occupation: [employer]  Tobacco Use   Smoking status: Never   Smokeless tobacco: Never  Substance and Sexual  Activity   Alcohol use: No   Drug use: No   Sexual activity: Yes  Other Topics Concern   Not on file  Social History Narrative   Not on file   Social Determinants of Health   Financial Resource Strain: Not on file  Food Insecurity: Not on file  Transportation Needs: Not on file  Physical Activity: Not on file  Stress: Not on file  Social Connections: Not on file  Intimate Partner Violence: Not on file    Past Surgical History:  Procedure Laterality Date   insulin pump  2005-present   "upgrades q 3 yrs" (06/04/2016)    Family History  Problem Relation Age of Onset   Breast cancer Mother    Hyperlipidemia Father    Hypertension Father    Colon cancer Paternal Grandfather    Lung cancer Paternal Uncle        smoler   Liver disease Paternal Uncle     No Known Allergies  Current Outpatient Medications on File Prior to Visit  Medication Sig Dispense Refill   acetaminophen (TYLENOL) 500 MG tablet Take 500 mg by mouth 3 (three) times daily as needed for mild pain.     amitriptyline (ELAVIL) 10 MG tablet Take 1 tablet (10 mg total) by mouth at bedtime.     aspirin EC 81  MG tablet Take 1 tablet (81 mg total) by mouth daily. Swallow whole. 90 tablet 3   atorvastatin (LIPITOR) 10 MG tablet Take 1 tablet (10 mg total) by mouth daily. 90 tablet 3   cetirizine (ZYRTEC) 10 MG tablet Take 10 mg by mouth daily as needed for allergies.     diclofenac Sodium (VOLTAREN) 1 % GEL APPLY 4 GRAMS TOPICALLY 4 TIMES DAILY 100 g 0   fluticasone (FLONASE) 50 MCG/ACT nasal spray SHAKE LIQUID AND USE 2 SPRAYS IN EACH NOSTRIL DAILY 48 g 1   gabapentin (NEURONTIN) 600 MG tablet Take 600 mg by mouth 4 (four) times daily.      insulin aspart (NOVOLOG) 100 UNIT/ML injection Use as needed in insulin pump.  MDD: 66 units/day     Insulin Human (INSULIN PUMP) SOLN Inject into the skin continuous. Use with Humalog - basal rate     insulin NPH Human (HUMULIN N,NOVOLIN N) 100 UNIT/ML injection Inject 6-45  Units into the skin See admin instructions. Used withInsulin pump 35-45 units per day per basal rate...Marland KitchenMarland KitchenInject 6-12 units subcutaneously per sliding scale as needed for blood sugar correction with insulin pump     levocetirizine (XYZAL) 5 MG tablet Take 1 tablet (5 mg total) by mouth every evening. 30 tablet 3   PAZEO 0.7 % SOLN Place 1 drop into both eyes daily as needed for allergies.  3   Vitamin D, Ergocalciferol, (DRISDOL) 1.25 MG (50000 UNIT) CAPS capsule Take 1 capsule (50,000 Units total) by mouth every 7 (seven) days. 8 capsule 0   metoprolol tartrate (LOPRESSOR) 100 MG tablet Take 1 tablet (100 mg total) by mouth once for 1 dose. Take two hours prior to your cardiac CT 1 tablet 0   No current facility-administered medications on file prior to visit.    BP 126/70   Pulse 70   Resp 18   Ht '4\' 11"'$  (1.499 m)   Wt 122 lb (55.3 kg)   SpO2 98%   BMI 24.64 kg/m        Objective:   Physical Exam  General- No acute distress. Pleasant patient. Neurologic- CNII- XII grossly intact.   Left shoulder- pain on rom and decreased abduction. Left elbow- mild lateral epicondlye tenderness on supination and pronation but not on palpation. Left wrist- no numbness or tingling to fingers on wrist manipulation.      Assessment & Plan:   Patient Instructions  Left shoulder pain, lateral epicondyle pain and CTS type signs/symptoms.  Continue elbow brace, add wrist cock up splint, continue ibuprofen and refer to sports med Dr. Raeford Razor.  Follow up date with me as needed prior to sport med referral if needed.   Mackie Pai, PA-C

## 2021-12-01 NOTE — Patient Instructions (Addendum)
Left shoulder pain, lateral epicondyle pain and CTS type signs/symptoms.  Continue elbow brace, add wrist cock up splint, continue ibuprofen and refer to sports med Dr. Raeford Razor.  Follow up date with me as needed prior to sport med referral if needed.

## 2021-12-09 ENCOUNTER — Encounter: Payer: Self-pay | Admitting: Family Medicine

## 2021-12-09 ENCOUNTER — Ambulatory Visit: Payer: Self-pay

## 2021-12-09 ENCOUNTER — Ambulatory Visit (INDEPENDENT_AMBULATORY_CARE_PROVIDER_SITE_OTHER): Payer: Medicare Other | Admitting: Family Medicine

## 2021-12-09 VITALS — BP 100/60 | Ht 59.0 in | Wt 122.0 lb

## 2021-12-09 DIAGNOSIS — N83292 Other ovarian cyst, left side: Secondary | ICD-10-CM | POA: Diagnosis not present

## 2021-12-09 DIAGNOSIS — Z30430 Encounter for insertion of intrauterine contraceptive device: Secondary | ICD-10-CM | POA: Diagnosis not present

## 2021-12-09 DIAGNOSIS — M7502 Adhesive capsulitis of left shoulder: Secondary | ICD-10-CM | POA: Insufficient documentation

## 2021-12-09 DIAGNOSIS — M7712 Lateral epicondylitis, left elbow: Secondary | ICD-10-CM

## 2021-12-09 DIAGNOSIS — M778 Other enthesopathies, not elsewhere classified: Secondary | ICD-10-CM | POA: Diagnosis not present

## 2021-12-09 DIAGNOSIS — Z3202 Encounter for pregnancy test, result negative: Secondary | ICD-10-CM | POA: Diagnosis not present

## 2021-12-09 NOTE — Progress Notes (Signed)
  Lisa Levine - 46 y.o. female MRN [id number]  Date of birth: [birth year]  SUBJECTIVE:  Including CC & ROS.  No chief complaint on file.   Lisa Levine is a 46 y.o. female that is presenting with acute left shoulder and elbow pain.  Having limited range of motion of the left shoulder.  No significant tenderness at the left elbow.  Review of the office note from 7/11 shows she was provided a wrist brace.   Review of Systems See HPI   HISTORY: Past Medical, Surgical, Social, and Family History Reviewed & Updated per EMR.   Pertinent Historical Findings include:  Past Medical History:  Diagnosis Date   Allergy    Arthritis    DVT (deep venous thrombosis) ([facility])    "often on my LLE" (06/04/2016)   Hyperlipidemia    Hypertension    Migraine    "q couple months" (06/04/2016)   Peripheral neuropathy    Thyroid disease    Type 1 diabetes mellitus ([place])    Vitamin D deficiency     Past Surgical History:  Procedure Laterality Date   insulin pump  2005-present   "upgrades q 3 yrs" (06/04/2016)     PHYSICAL EXAM:  VS: BP 100/60 (BP Location: Left Arm, Patient Position: Sitting)   Ht '4\' 11"'$  (1.499 m)   Wt 122 lb (55.3 kg)   BMI 24.64 kg/m  Physical Exam Gen: NAD, alert, cooperative with exam, well-appearing MSK:  Neurovascularly intact     Aspiration/Injection Procedure Note Dawnisha T Boldman Dec 25, 1975  Procedure: Injection Indications: Left shoulder pain  Procedure Details Consent: Risks of procedure as well as the alternatives and risks of each were explained to the (patient/caregiver).  Consent for procedure obtained. Time Out: Verified patient identification, verified procedure, site/side was marked, verified correct patient position, special equipment/implants available, medications/allergies/relevent history reviewed, required imaging and test results available.  Performed.  The area was cleaned with iodine and alcohol swabs.    The left glenohumeral joint was injected using 3 cc  of 1% lidocaine on a 21-gauge 2 inch needle.  The syringe was switched to mixture containing 3 cc of 1% lidocaine,1 cc's of 40 mg Kenalog and 3 cc's of 0.25% bupivacaine was injected.  Ultrasound was used. Images were obtained in short views showing the injection.     A sterile dressing was applied.  Patient did tolerate procedure well.     ASSESSMENT & PLAN:   Lateral epicondylitis, left elbow Acutely occurring.  Reports pain at the elbow but no tenderness on exam.  Pain may be referral from the shoulder. -Counseled on home exercise therapy and supportive care. -Pursue shockwave therapy.  Capsulitis of left shoulder Acutely occurring.  She has limited external rotation -Counseled on home exercise therapy and supportive care. -Injection today. -Could consider physical therapy or Hydro dilation.

## 2021-12-09 NOTE — Patient Instructions (Signed)
Good to see you Please try heat before exercise and ice after  Please try the exercise   Please send me a message in MyChart with any questions or updates.  Please see me back to start shockwave on the elbow.   --Dr. Raeford Razor

## 2021-12-09 NOTE — Assessment & Plan Note (Signed)
Acutely occurring.  She has limited external rotation -Counseled on home exercise therapy and supportive care. -Injection today. -Could consider physical therapy or Hydro dilation.

## 2021-12-09 NOTE — Assessment & Plan Note (Signed)
Acutely occurring.  Reports pain at the elbow but no tenderness on exam.  Pain may be referral from the shoulder. -Counseled on home exercise therapy and supportive care. -Pursue shockwave therapy.

## 2021-12-13 ENCOUNTER — Other Ambulatory Visit: Payer: Self-pay

## 2021-12-13 ENCOUNTER — Encounter (HOSPITAL_BASED_OUTPATIENT_CLINIC_OR_DEPARTMENT_OTHER): Payer: Self-pay | Admitting: Emergency Medicine

## 2021-12-13 DIAGNOSIS — R0789 Other chest pain: Secondary | ICD-10-CM | POA: Diagnosis not present

## 2021-12-13 DIAGNOSIS — R112 Nausea with vomiting, unspecified: Secondary | ICD-10-CM | POA: Insufficient documentation

## 2021-12-13 DIAGNOSIS — Z5321 Procedure and treatment not carried out due to patient leaving prior to being seen by health care provider: Secondary | ICD-10-CM | POA: Insufficient documentation

## 2021-12-13 DIAGNOSIS — R42 Dizziness and giddiness: Secondary | ICD-10-CM | POA: Insufficient documentation

## 2021-12-13 LAB — CBG MONITORING, ED: Glucose-Capillary: 156 mg/dL — ABNORMAL HIGH (ref 70–99)

## 2021-12-13 MED ORDER — ONDANSETRON 4 MG PO TBDP
4.0000 mg | ORAL_TABLET | Freq: Once | ORAL | Status: AC
  Administered 2021-12-13: 4 mg via ORAL
  Filled 2021-12-13: qty 1

## 2021-12-13 NOTE — ED Triage Notes (Signed)
Pt reports NV today and chest tightness; she is unable to keep anything down; this started after she changed her insulin pod with possibly expired insulin; c/o dizziness

## 2021-12-14 ENCOUNTER — Emergency Department (HOSPITAL_BASED_OUTPATIENT_CLINIC_OR_DEPARTMENT_OTHER)
Admission: EM | Admit: 2021-12-14 | Discharge: 2021-12-14 | Disposition: A | Discharge status: Home / Self Care | Payer: Medicare Other | Attending: Emergency Medicine | Admitting: Emergency Medicine

## 2021-12-14 NOTE — ED Notes (Signed)
Called x1, no answer

## 2021-12-15 ENCOUNTER — Other Ambulatory Visit: Payer: Self-pay

## 2021-12-15 ENCOUNTER — Encounter (HOSPITAL_BASED_OUTPATIENT_CLINIC_OR_DEPARTMENT_OTHER): Payer: Self-pay | Admitting: Emergency Medicine

## 2021-12-15 ENCOUNTER — Emergency Department (HOSPITAL_BASED_OUTPATIENT_CLINIC_OR_DEPARTMENT_OTHER)
Admission: EM | Admit: 2021-12-15 | Discharge: 2021-12-15 | Disposition: A | Discharge status: Home / Self Care | Payer: Medicare Other | Attending: Emergency Medicine | Admitting: Emergency Medicine

## 2021-12-15 ENCOUNTER — Emergency Department (HOSPITAL_BASED_OUTPATIENT_CLINIC_OR_DEPARTMENT_OTHER): Payer: Medicare Other

## 2021-12-15 DIAGNOSIS — R82998 Other abnormal findings in urine: Secondary | ICD-10-CM | POA: Diagnosis not present

## 2021-12-15 DIAGNOSIS — E109 Type 1 diabetes mellitus without complications: Secondary | ICD-10-CM | POA: Insufficient documentation

## 2021-12-15 DIAGNOSIS — Z7982 Long term (current) use of aspirin: Secondary | ICD-10-CM | POA: Insufficient documentation

## 2021-12-15 DIAGNOSIS — R5383 Other fatigue: Secondary | ICD-10-CM | POA: Insufficient documentation

## 2021-12-15 DIAGNOSIS — R110 Nausea: Secondary | ICD-10-CM

## 2021-12-15 DIAGNOSIS — Z794 Long term (current) use of insulin: Secondary | ICD-10-CM | POA: Insufficient documentation

## 2021-12-15 DIAGNOSIS — Z79899 Other long term (current) drug therapy: Secondary | ICD-10-CM | POA: Diagnosis not present

## 2021-12-15 DIAGNOSIS — I10 Essential (primary) hypertension: Secondary | ICD-10-CM | POA: Insufficient documentation

## 2021-12-15 DIAGNOSIS — Z86718 Personal history of other venous thrombosis and embolism: Secondary | ICD-10-CM | POA: Insufficient documentation

## 2021-12-15 DIAGNOSIS — R112 Nausea with vomiting, unspecified: Secondary | ICD-10-CM | POA: Diagnosis not present

## 2021-12-15 DIAGNOSIS — R0789 Other chest pain: Secondary | ICD-10-CM | POA: Diagnosis not present

## 2021-12-15 LAB — URINALYSIS, ROUTINE W REFLEX MICROSCOPIC
Bilirubin Urine: NEGATIVE
Glucose, UA: NEGATIVE mg/dL
Ketones, ur: 40 mg/dL — AB
Nitrite: NEGATIVE
Protein, ur: NEGATIVE mg/dL
Specific Gravity, Urine: >=1.03 (ref 1.005–1.030)
pH: 5.5 (ref 5.0–8.0)

## 2021-12-15 LAB — CBC
HCT: 41.4 % (ref 36.0–46.0)
Hemoglobin: 14 g/dL (ref 12.0–15.0)
MCH: 31.8 pg (ref 26.0–34.0)
MCHC: 33.8 g/dL (ref 30.0–36.0)
MCV: 94.1 fL (ref 80.0–100.0)
Platelets: 366 10*3/uL (ref 150–400)
RBC: 4.4 MIL/uL (ref 3.87–5.11)
RDW: 12.1 % (ref 11.5–15.5)
WBC: 8.3 10*3/uL (ref 4.0–10.5)
nRBC: 0 % (ref 0.0–0.2)

## 2021-12-15 LAB — URINALYSIS, MICROSCOPIC (REFLEX): Bacteria, UA: NONE SEEN

## 2021-12-15 LAB — TROPONIN I (HIGH SENSITIVITY): Troponin I (High Sensitivity): <2 ng/L (ref <18)

## 2021-12-15 LAB — COMPREHENSIVE METABOLIC PANEL
ALT: 18 U/L (ref 0–44)
AST: 17 U/L (ref 15–41)
Albumin: 4 g/dL (ref 3.5–5.0)
Alkaline Phosphatase: 49 U/L (ref 38–126)
Anion gap: 7 (ref 5–15)
BUN: 11 mg/dL (ref 6–20)
CO2: 27 mmol/L (ref 22–32)
Calcium: 8.5 mg/dL — ABNORMAL LOW (ref 8.9–10.3)
Chloride: 101 mmol/L (ref 98–111)
Creatinine, Ser: 0.64 mg/dL (ref 0.44–1.00)
GFR, Estimated: >60 mL/min (ref >60)
Glucose, Bld: 225 mg/dL — ABNORMAL HIGH (ref 70–99)
Potassium: 3.6 mmol/L (ref 3.5–5.1)
Sodium: 135 mmol/L (ref 135–145)
Total Bilirubin: 0.7 mg/dL (ref 0.3–1.2)
Total Protein: 7.8 g/dL (ref 6.5–8.1)

## 2021-12-15 LAB — PREGNANCY, URINE: Preg Test, Ur: NEGATIVE

## 2021-12-15 LAB — CBG MONITORING, ED: Glucose-Capillary: 181 mg/dL — ABNORMAL HIGH (ref 70–99)

## 2021-12-15 MED ORDER — ONDANSETRON HCL 4 MG/2ML IJ SOLN
4.0000 mg | Freq: Once | INTRAMUSCULAR | Status: AC
Start: 2021-12-15 — End: 2021-12-15
  Administered 2021-12-15: 4 mg via INTRAVENOUS
  Filled 2021-12-15: qty 2

## 2021-12-15 MED ORDER — ONDANSETRON 8 MG PO TBDP: 8.0000 mg | ORAL_TABLET | Freq: Three times a day (TID) | ORAL | 0 refills | Status: AC | PRN

## 2021-12-15 MED ORDER — SODIUM CHLORIDE 0.9 % IV BOLUS
1000.0000 mL | Freq: Once | INTRAVENOUS | Status: AC
Start: 2021-12-15 — End: 2021-12-15
  Administered 2021-12-15: 1000 mL via INTRAVENOUS

## 2021-12-15 NOTE — ED Provider Notes (Signed)
[facility] Provider Note   CSN: [id number] Arrival date & time: 12/15/21  1838     History PMH: Diabetes type 1, hyperlipidemia, hypertension, history of DVT, migraines Chief Complaint  Patient presents with   Nausea   Fatigue    Lisa Levine is a 46 y.o. female. Patient presents with 1 week of malaise feelings, nausea and vomiting.  She says that she is recently switched out her insulin pump and got a new batch of insulin.  She did not notice these changes until that happened.  She states her blood sugars have been under control, but she has had persistent nausea as well as vomiting.  She says she has not really been able to keep down any liquids or food.  She also had associated chest tightness 2 days ago and again yesterday.  The symptoms have actually resolved.  She says that she feels dehydrated, and every time she walks she has trouble catching her breath gets really winded easily.   He states nobody else has been sick. She also states she took a COVID test today which was negative. These symptoms are very atypical for her as she is normally very energetic and all of her chronic health conditions are very well controlled at the moment. She denies any abdominal pain, constipation, diarrhea, dysuria, hematuria, shortness of breath, neck stiffness, fevers, chills.  HPI     Home Medications Prior to Admission medications   Medication Sig Start Date End Date Taking? Authorizing Provider  ondansetron (ZOFRAN-ODT) 8 MG disintegrating tablet Take 1 tablet (8 mg total) by mouth every 8 (eight) hours as needed for up to 7 days for nausea or vomiting. 12/15/21 12/22/21 Yes Stpehen Petitjean, Adora Fridge, PA-C  acetaminophen (TYLENOL) 500 MG tablet Take 500 mg by mouth 3 (three) times daily as needed for mild pain.    [provider]  amitriptyline (ELAVIL) 10 MG tablet Take 1 tablet (10 mg total) by mouth at bedtime. 10/27/16   Geradine Girt, DO  aspirin EC 81 MG  tablet Take 1 tablet (81 mg total) by mouth daily. Swallow whole. 10/01/20   Richardo Priest, MD  atorvastatin (LIPITOR) 10 MG tablet Take 1 tablet (10 mg total) by mouth daily. 10/05/21   Saguier, Percell Miller, PA-C  cetirizine (ZYRTEC) 10 MG tablet Take 10 mg by mouth daily as needed for allergies.    [provider]  diclofenac Sodium (VOLTAREN) 1 % GEL APPLY 4 GRAMS TOPICALLY 4 TIMES DAILY 06/26/19   Saguier, Percell Miller, PA-C  fluticasone [facility]) 50 MCG/ACT nasal spray SHAKE LIQUID AND USE 2 SPRAYS IN [facility] NOSTRIL DAILY 09/20/16   Saguier, Percell Miller, PA-C  gabapentin (NEURONTIN) 600 MG tablet Take 600 mg by mouth 4 (four) times daily.     [provider]  insulin aspart (NOVOLOG) 100 UNIT/ML injection Use as needed in insulin pump.  MDD: 66 units/day 09/22/20   [provider]  Insulin Human (INSULIN PUMP) SOLN Inject into the skin continuous. Use with Humalog - basal rate    [provider]  insulin NPH Human (HUMULIN N,NOVOLIN N) 100 UNIT/ML injection Inject 6-45 Units into the skin See admin instructions. Used withInsulin pump 35-45 units per day per basal rate...Marland KitchenMarland KitchenInject 6-12 units subcutaneously per sliding scale as needed for blood sugar correction with insulin pump    [provider]  levocetirizine (XYZAL) 5 MG tablet Take 1 tablet (5 mg total) by mouth every evening. 10/06/21   Saguier, Percell Miller, PA-C  metoprolol tartrate (  LOPRESSOR) 100 MG tablet Take 1 tablet (100 mg total) by mouth once for 1 dose. Take two hours prior to your cardiac CT 10/01/20 10/01/20  Richardo Priest, MD  PAZEO 0.7 % SOLN Place 1 drop into both eyes daily as needed for allergies. 09/10/16   [provider]  Vitamin D, Ergocalciferol, (DRISDOL) 1.25 MG (50000 UNIT) CAPS capsule Take 1 capsule (50,000 Units total) by mouth every 7 (seven) days. 10/03/21   Saguier, Percell Miller, PA-C      Allergies    Patient has no known allergies.    Review of Systems   Review of Systems  Constitutional:   Positive for appetite change and fatigue. Negative for chills and fever.  Respiratory:  Positive for chest tightness. Negative for cough and shortness of breath.   Gastrointestinal:  Positive for nausea and vomiting. Negative for abdominal distention, abdominal pain, constipation and diarrhea.  Genitourinary:  Negative for dysuria, flank pain and hematuria.  Neurological:  Negative for dizziness, syncope and weakness.  All other systems reviewed and are negative.   Physical Exam Updated Vital Signs BP 137/83   Pulse 81   Temp 98.7 F (37.1 C)   Resp 16   Ht '4\' 10"'$  (1.473 m)   Wt 54.4 kg   LMP 12/10/2021   SpO2 100%   BMI 25.07 kg/m  Physical Exam Vitals and nursing note reviewed.  Constitutional:      General: She is not in acute distress.    Appearance: Normal appearance. She is not ill-appearing, toxic-appearing or diaphoretic.  HENT:     Head: Normocephalic and atraumatic.     Nose: No nasal deformity.     Mouth/Throat:     Lips: Pink. No lesions.     Mouth: Mucous membranes are moist. No injury, lacerations, oral lesions or angioedema.     Pharynx: Oropharynx is clear. Uvula midline. No pharyngeal swelling, oropharyngeal exudate, posterior oropharyngeal erythema or uvula swelling.  Eyes:     General: Gaze aligned appropriately. No scleral icterus.       Right eye: No discharge.        Left eye: No discharge.     Conjunctiva/sclera: Conjunctivae normal.     Right eye: Right conjunctiva is not injected. No exudate or hemorrhage.    Left eye: Left conjunctiva is not injected. No exudate or hemorrhage. Cardiovascular:     Rate and Rhythm: Normal rate and regular rhythm.     Pulses: Normal pulses.          Radial pulses are 2+ on the right side and 2+ on the left side.       Dorsalis pedis pulses are 2+ on the right side and 2+ on the left side.     Heart sounds: Normal heart sounds, S1 normal and S2 normal. Heart sounds not distant. No murmur heard.    No friction rub.  No gallop. No S3 or S4 sounds.  Pulmonary:     Effort: Pulmonary effort is normal. No accessory muscle usage or respiratory distress.     Breath sounds: Normal breath sounds. No stridor. No wheezing, rhonchi or rales.  Chest:     Chest wall: No tenderness.  Abdominal:     General: Abdomen is flat. There is no distension.     Palpations: Abdomen is soft. There is no mass or pulsatile mass.     Tenderness: There is no abdominal tenderness. There is no right CVA tenderness, left CVA tenderness, guarding or rebound.  Musculoskeletal:  Right lower leg: No edema.     Left lower leg: No edema.  Skin:    General: Skin is warm and dry.     Coloration: Skin is not jaundiced or pale.     Findings: No bruising, erythema, lesion or rash.  Neurological:     General: No focal deficit present.     Mental Status: She is alert and oriented to person, place, and time.     GCS: GCS eye subscore is 4. GCS verbal subscore is 5. GCS motor subscore is 6.  Psychiatric:        Mood and Affect: Mood normal.        Behavior: Behavior normal. Behavior is cooperative.     ED Results / Procedures / Treatments   Labs (all labs ordered are listed, but only abnormal results are displayed) Labs Reviewed  COMPREHENSIVE METABOLIC PANEL - Abnormal; Notable for the following components:      Result Value   Glucose, Bld 225 (*)    Calcium 8.5 (*)    All other components within normal limits  URINALYSIS, ROUTINE W REFLEX MICROSCOPIC - Abnormal; Notable for the following components:   APPearance HAZY (*)    Hgb urine dipstick SMALL (*)    Ketones, ur 40 (*)    Leukocytes,Ua SMALL (*)    All other components within normal limits  CBG MONITORING, ED - Abnormal; Notable for the following components:   Glucose-Capillary 181 (*)    All other components within normal limits  CBC  PREGNANCY, URINE  URINALYSIS, MICROSCOPIC (REFLEX)  TROPONIN I (HIGH SENSITIVITY)  TROPONIN I (HIGH SENSITIVITY)     EKG None  Radiology DG Chest Port 1 View  Result Date: 12/15/2021 CLINICAL DATA:  Nausea, vomiting and fatigue for 2 days. History of diabetes. EXAM: PORTABLE CHEST 1 VIEW COMPARISON:  CT 09/02/2020.  Radiographs 08/25/2020. FINDINGS: 2122 hours. The heart size and mediastinal contours are normal. The lungs are clear. There is no pleural effusion or pneumothorax. No acute osseous findings are identified. IMPRESSION: Stable chest.  No active cardiopulmonary process. Electronically Signed   By: Richardean Sale M.D.   On: 12/15/2021 21:40    Procedures Procedures  This patient was on telemetry or cardiac monitoring during their time in the ED.    Medications Ordered in ED Medications  sodium chloride 0.9 % bolus 1,000 mL (0 mLs Intravenous Stopped 12/15/21 2305)  ondansetron (ZOFRAN) injection 4 mg (4 mg Intravenous Given 12/15/21 2143)    ED Course/ Medical Decision Making/ A&P                           Medical Decision Making Amount and/or Complexity of Data Reviewed Labs: ordered. Radiology: ordered.  Risk Prescription drug management.    MDM  This is a 46 y.o. female who presents to the ED with fatigue, nausea, and vomiting for one week The differential of this patient includes but is not limited to ACS, gastroenteritis, insulin problem, DKA, etc.   Initial Impression  Patient is well-appearing and in no acute distress.  She has stable vitals and is a's febrile. Her exam is unremarkable with no nuchal rigidity or meningeal signs.  She has a benign abdominal exam.  She is not actively vomiting with me.  I do not think that abdominal CT is warranted at this time.  We will give IV Zofran as well as fluids and reassess.  She did state that she had chest tightness  2 days prior as well as yesterday.  She had an EKG done the other day when she left without being seen.  Reviewed this and it looks unremarkable.  Added on cardiac markers and chest x-ray to evaluate for ACS.   I  personally ordered, reviewed, and interpreted all laboratory work and imaging and agree with radiologist interpretation. Results interpreted below: CBC without leukocytosis or any other abnormalities.  CMP does show glucose of 225 without any evidence of acidosis or anion gap.  LFTs, electrolytes, kidney function are normal.  Urinalysis does show 40 ketones, but is otherwise unremarkable.  She is not pregnant.  Initial troponin is less than 2.  No second troponin is indicated.  Chest x-ray shows no acute abnormalities.  EKG is comparable to prior of couple of days ago with no ischemic changes.  Assessment/Plan:  Work-up is overall unremarkable without any elevated cardiac enzymes or concerning findings to suggest this is a cardiac etiology.  I do not think we need any CT imaging of her abdomen as she has no pain and she has not had any recurrence of vomiting since she has been here.  She actually feels better after having IV fluids and feels like the metallic taste in her mouth is improved.  I do not think she needs any further work-up or require admission to the hospital.  I recommend that she contact her endocrinologist to talk about the problem with the insulin that she started this week.  I also recommend that she follow-up with her PCP for these symptoms.  Return precautions are provided.   Charting Requirements Additional history is obtained from:  Independent historian External Records from outside source obtained and reviewed including: Prior labs, recent office visit today for IUD Social Determinants of Health:  none Pertinant PMH that complicates patient's illness: n/a  Patient Care Problems that were addressed during this visit: - Nausea: Acute illness with systemic symptoms This patient was maintained on a cardiac monitor/telemetry. I personally viewed and interpreted the cardiac monitor which reveals an underlying rhythm of NSR Medications given in ED: IVF, zofran Reevaluation of the  patient after these medicines showed that the patient improved I have reviewed home medications and made changes accordingly.  Critical Care Interventions: n/a Consultations: n/a Disposition: discharge  Portions of this note were generated with Dragon dictation software. Dictation errors may occur despite best attempts at proofreading.    Final Clinical Impression(s) / ED Diagnoses Final diagnoses:  Nausea    Rx / DC Orders ED Discharge Orders          Ordered    ondansetron (ZOFRAN-ODT) 8 MG disintegrating tablet  Every 8 hours PRN        12/15/21 2259              Adolphus Birchwood, PA-C 12/15/21 2328    Malvin Johns, MD 12/15/21 [phone]

## 2021-12-15 NOTE — Discharge Instructions (Signed)
Your workup today was very reassuring. Please contact your Endocrinologist regarding your symptoms as well as following up with your PCP.

## 2021-12-15 NOTE — ED Triage Notes (Signed)
N/V, fatigue since Sunday.  Sx started after changing the insulin in her pump.  Denies cp, abdominal pain. Pt here on 7/24, but LWBS due to extended wait times.

## 2022-01-07 ENCOUNTER — Ambulatory Visit: Payer: Medicare Other | Admitting: Family Medicine

## 2022-01-08 ENCOUNTER — Ambulatory Visit (INDEPENDENT_AMBULATORY_CARE_PROVIDER_SITE_OTHER): Payer: Medicare Other | Admitting: Family Medicine

## 2022-01-08 ENCOUNTER — Encounter: Payer: Self-pay | Admitting: Family Medicine

## 2022-01-08 VITALS — BP 128/80 | Ht <= 58 in | Wt 119.0 lb

## 2022-01-08 DIAGNOSIS — M778 Other enthesopathies, not elsewhere classified: Secondary | ICD-10-CM

## 2022-01-08 DIAGNOSIS — M7712 Lateral epicondylitis, left elbow: Secondary | ICD-10-CM

## 2022-01-08 NOTE — Progress Notes (Signed)
  Lisa Levine - 46 y.o. female MRN [id number]  Date of birth: Jun 30, 1975  SUBJECTIVE:  Including CC & ROS.  No chief complaint on file.   Lisa Levine is a 46 y.o. female that is following up for her left shoulder and elbow pain.  The shoulder is doing well with the elbow is still painful.  She has tried the nitro patches with limited improvement..   Review of Systems See HPI   HISTORY: Past Medical, Surgical, Social, and Family History Reviewed & Updated per EMR.   Pertinent Historical Findings include:  Past Medical History:  Diagnosis Date   Allergy    Arthritis    DVT (deep venous thrombosis) (HCC)    "often on my LLE" (06/04/2016)   Hyperlipidemia    Hypertension    Migraine    "q couple months" (06/04/2016)   Peripheral neuropathy    Thyroid disease    Type 1 diabetes mellitus ([place])    Vitamin D deficiency     Past Surgical History:  Procedure Laterality Date   insulin pump  2005-present   "upgrades q 3 yrs" (06/04/2016)     PHYSICAL EXAM:  VS: BP 128/80 (BP Location: Right Arm, Patient Position: Sitting)   Ht '4\' 10"'$  (1.473 m)   Wt 119 lb (54 kg)   LMP 12/10/2021   BMI 24.87 kg/m  Physical Exam Gen: NAD, alert, cooperative with exam, well-appearing MSK:  Neurovascularly intact       ASSESSMENT & PLAN:   Capsulitis of left shoulder Well since the injection.  Has good range of motion and strength. -Counseled on home exercise therapy and supportive care. -Could consider further imaging.  Lateral epicondylitis, left elbow Acutely occurring.  No improvement with nitro patches.  May have component of ulnar nerve as she does have radiation down the arm into the hand. -Counseled on home exercise therapy and supportive care. -Pursue shockwave therapy. -Could consider nerve study or injection.

## 2022-01-08 NOTE — Assessment & Plan Note (Signed)
Well since the injection.  Has good range of motion and strength. -Counseled on home exercise therapy and supportive care. -Could consider further imaging.

## 2022-01-08 NOTE — Patient Instructions (Signed)
Good to see you Please try the voltaren  Please continue the exercises  Please send me a message in MyChart with any questions or updates.  Please see me back to start shockwave therapy.   --Dr. Raeford Razor

## 2022-01-08 NOTE — Assessment & Plan Note (Signed)
Acutely occurring.  No improvement with nitro patches.  May have component of ulnar nerve as she does have radiation down the arm into the hand. -Counseled on home exercise therapy and supportive care. -Pursue shockwave therapy. -Could consider nerve study or injection.

## 2022-01-13 ENCOUNTER — Ambulatory Visit (INDEPENDENT_AMBULATORY_CARE_PROVIDER_SITE_OTHER): Payer: Self-pay | Admitting: Family Medicine

## 2022-01-13 DIAGNOSIS — M7712 Lateral epicondylitis, left elbow: Secondary | ICD-10-CM

## 2022-01-13 NOTE — Assessment & Plan Note (Signed)
Completed shockwave therapy  

## 2022-01-13 NOTE — Progress Notes (Signed)
  Lisa Levine - 46 y.o. female MRN [id number]  Date of birth: [birth year]  SUBJECTIVE:  Including CC & ROS.  No chief complaint on file.   Lisa Levine is a 46 y.o. female that is here for shockwave therapy.    Review of Systems See HPI   HISTORY: Past Medical, Surgical, Social, and Family History Reviewed & Updated per EMR.   Pertinent Historical Findings include:  Past Medical History:  Diagnosis Date   Allergy    Arthritis    DVT (deep venous thrombosis) ([place])    "often on my LLE" (06/04/2016)   Hyperlipidemia    Hypertension    Migraine    "q couple months" (06/04/2016)   Peripheral neuropathy    Thyroid disease    Type 1 diabetes mellitus ([place])    Vitamin D deficiency     Past Surgical History:  Procedure Laterality Date   insulin pump  2005-present   "upgrades q 3 yrs" (06/04/2016)     PHYSICAL EXAM:  VS: Ht '4\' 10"'$  (1.473 m)   Wt 119 lb (54 kg)   LMP 12/10/2021   BMI 24.87 kg/m  Physical Exam Gen: NAD, alert, cooperative with exam, well-appearing MSK:  Neurovascularly intact    ECSWT Note Lisa Levine 01-13-1976  Procedure: ECSWT Indications: left elbow pain  Procedure Details Consent: Risks of procedure as well as the alternatives and risks of each were explained to the (patient/caregiver).  Consent for procedure obtained. Time Out: Verified patient identification, verified procedure, site/side was marked, verified correct patient position, special equipment/implants available, medications/allergies/relevent history reviewed, required imaging and test results available.  Performed.  The area was cleaned with iodine and alcohol swabs.    The left elbow was targeted for Extracorporeal shockwave therapy.   Preset: lateral epicondylitis Power Level: 70 Frequency: 10 Impulse/cycles: 2000 Head size: small  Session: 1  Patient did tolerate procedure well.    ASSESSMENT & PLAN:   Lateral epicondylitis, left elbow Completed shockwave therapy

## 2022-01-14 ENCOUNTER — Other Ambulatory Visit: Payer: Self-pay | Admitting: Medical

## 2022-01-14 MED ORDER — DICLOFENAC SODIUM 1 % EX GEL: 4.0000 g | Freq: Four times a day (QID) | CUTANEOUS | 0 refills | Status: DC

## 2022-01-18 ENCOUNTER — Ambulatory Visit (INDEPENDENT_AMBULATORY_CARE_PROVIDER_SITE_OTHER): Payer: Medicare Other | Admitting: Family Medicine

## 2022-01-18 ENCOUNTER — Encounter: Payer: Self-pay | Admitting: Family Medicine

## 2022-01-18 DIAGNOSIS — M7712 Lateral epicondylitis, left elbow: Secondary | ICD-10-CM

## 2022-01-18 NOTE — Assessment & Plan Note (Signed)
Completed shockwave therapy  

## 2022-01-18 NOTE — Progress Notes (Signed)
  Lisa Levine - 46 y.o. female MRN [id number]  Date of birth: 1976/05/01  SUBJECTIVE:  Including CC & ROS.  No chief complaint on file.   Lisa Levine is a 46 y.o. female that is here for shockwave therapy.    Review of Systems See HPI   HISTORY: Past Medical, Surgical, Social, and Family History Reviewed & Updated per EMR.   Pertinent Historical Findings include:  Past Medical History:  Diagnosis Date   Allergy    Arthritis    DVT (deep venous thrombosis) ([place])    "often on my LLE" (06/04/2016)   Hyperlipidemia    Hypertension    Migraine    "q couple months" (06/04/2016)   Peripheral neuropathy    Thyroid disease    Type 1 diabetes mellitus ([place])    Vitamin D deficiency     Past Surgical History:  Procedure Laterality Date   insulin pump  2005-present   "upgrades q 3 yrs" (06/04/2016)     PHYSICAL EXAM:  VS: Ht '4\' 10"'$  (1.473 m)   Wt 119 lb (54 kg)   BMI 24.87 kg/m  Physical Exam Gen: NAD, alert, cooperative with exam, well-appearing MSK:  Neurovascularly intact    ECSWT Note Lisa Levine 11-Feb-1976  Procedure: ECSWT Indications: left elbow pain  Procedure Details Consent: Risks of procedure as well as the alternatives and risks of each were explained to the (patient/caregiver).  Consent for procedure obtained. Time Out: Verified patient identification, verified procedure, site/side was marked, verified correct patient position, special equipment/implants available, medications/allergies/relevent history reviewed, required imaging and test results available.  Performed.  The area was cleaned with iodine and alcohol swabs.    The left elbow was targeted for Extracorporeal shockwave therapy.   Preset: Lateral epicondylitis Power Level: 80 Frequency: 10 Impulse/cycles: 2200 Head size: small  Session: 2  Patient did tolerate procedure well.    ASSESSMENT & PLAN:   Lateral epicondylitis, left elbow Completed shockwave therapy    .

## 2022-01-21 ENCOUNTER — Ambulatory Visit (INDEPENDENT_AMBULATORY_CARE_PROVIDER_SITE_OTHER): Payer: Medicare Other | Admitting: Family Medicine

## 2022-01-21 ENCOUNTER — Encounter: Payer: Self-pay | Admitting: Family Medicine

## 2022-01-21 ENCOUNTER — Ambulatory Visit: Payer: Self-pay

## 2022-01-21 VITALS — BP 108/62 | Ht <= 58 in | Wt 119.0 lb

## 2022-01-21 DIAGNOSIS — M7712 Lateral epicondylitis, left elbow: Secondary | ICD-10-CM | POA: Diagnosis not present

## 2022-01-21 MED ORDER — BETAMETHASONE SOD PHOS & ACET 6 (3-3) MG/ML IJ SUSP
6.0000 mg | Freq: Once | INTRAMUSCULAR | Status: AC
  Administered 2022-01-21: 6 mg via INTRA_ARTICULAR

## 2022-01-21 NOTE — Assessment & Plan Note (Signed)
Completed injection today. 

## 2022-01-21 NOTE — Progress Notes (Signed)
  KYRIELLE URBANSKI - 46 y.o. female MRN [id number]  Date of birth: 08-18-1975  SUBJECTIVE:  Including CC & ROS.  No chief complaint on file.   Adaja DANNIEL GRENZ is a 46 y.o. female that is  here for a elbow injection.   Review of Systems See HPI   HISTORY: Past Medical, Surgical, Social, and Family History Reviewed & Updated per EMR.   Pertinent Historical Findings include:  Past Medical History:  Diagnosis Date   Allergy    Arthritis    DVT (deep venous thrombosis) (HCC)    "often on my LLE" (06/04/2016)   Hyperlipidemia    Hypertension    Migraine    "q couple months" (06/04/2016)   Peripheral neuropathy    Thyroid disease    Type 1 diabetes mellitus ([place])    Vitamin D deficiency     Past Surgical History:  Procedure Laterality Date   insulin pump  2005-present   "upgrades q 3 yrs" (06/04/2016)     PHYSICAL EXAM:  VS: BP 108/62 (BP Location: Left Arm, Patient Position: Sitting)   Ht '4\' 10"'$  (1.473 m)   Wt 119 lb (54 kg)   BMI 24.87 kg/m  Physical Exam Gen: NAD, alert, cooperative with exam, well-appearing MSK:  Neurovascularly intact    Limited ultrasound: left elbow :  No hyperemia of the lateral epicondyle  No joint effusion   Summary: no acute changes of the lateral epicondyle  Ultrasound and interpretation by Clearance Coots, MD   Aspiration/Injection Procedure Note Sontee T Lupton 07-Jan-1976  Procedure: Injection Indications: left elbow pain  Procedure Details Consent: Risks of procedure as well as the alternatives and risks of each were explained to the (patient/caregiver).  Consent for procedure obtained. Time Out: Verified patient identification, verified procedure, site/side was marked, verified correct patient position, special equipment/implants available, medications/allergies/relevent history reviewed, required imaging and test results available.  Performed.  The area was cleaned with iodine and alcohol swabs.    The left lateral epicondyle was injected using 1  cc's of 6 mg betamethasone and 2 cc's of 0.25% bupivacaine with a 25 1 1/2" needle.  Ultrasound was used. Images were obtained in long views showing the injection.     A sterile dressing was applied.  Patient did tolerate procedure well.  ASSESSMENT & PLAN:   Lateral epicondylitis, left elbow Completed injection today.

## 2022-02-08 DIAGNOSIS — E559 Vitamin D deficiency, unspecified: Secondary | ICD-10-CM | POA: Diagnosis not present

## 2022-02-08 DIAGNOSIS — E1042 Type 1 diabetes mellitus with diabetic polyneuropathy: Secondary | ICD-10-CM | POA: Diagnosis not present

## 2022-02-08 DIAGNOSIS — Z9641 Presence of insulin pump (external) (internal): Secondary | ICD-10-CM | POA: Diagnosis not present

## 2022-02-26 DIAGNOSIS — E1042 Type 1 diabetes mellitus with diabetic polyneuropathy: Secondary | ICD-10-CM | POA: Diagnosis not present

## 2022-03-10 ENCOUNTER — Other Ambulatory Visit: Payer: Self-pay | Admitting: Medical

## 2022-03-12 ENCOUNTER — Encounter: Payer: Self-pay | Admitting: Family Medicine

## 2022-03-17 ENCOUNTER — Ambulatory Visit: Payer: Medicare Other | Admitting: Family Medicine

## 2022-03-17 ENCOUNTER — Ambulatory Visit: Payer: Self-pay

## 2022-03-17 VITALS — BP 108/78 | Ht 58.5 in | Wt 118.0 lb

## 2022-03-17 DIAGNOSIS — M7712 Lateral epicondylitis, left elbow: Secondary | ICD-10-CM | POA: Diagnosis not present

## 2022-03-17 DIAGNOSIS — G5602 Carpal tunnel syndrome, left upper limb: Secondary | ICD-10-CM | POA: Diagnosis not present

## 2022-03-17 DIAGNOSIS — M25512 Pain in left shoulder: Secondary | ICD-10-CM

## 2022-03-17 DIAGNOSIS — M25532 Pain in left wrist: Secondary | ICD-10-CM | POA: Insufficient documentation

## 2022-03-17 DIAGNOSIS — M25522 Pain in left elbow: Secondary | ICD-10-CM

## 2022-03-17 NOTE — Patient Instructions (Addendum)
[facility] [place], [address], [place], [place] [address] Phone: [phone] Please call to schedule your MRI  You have mild carpal tunnel syndrome. Wear the wrist brace at nighttime. Ok to take aleve 1-2 tabs twice a day with food if needed.  We will go ahead with an MRI of your elbow as this should have improved with all the conservative measures for tennis elbow. We will call you with the results.  You have rotator cuff impingement of your left shoulder. Try to avoid painful activities (overhead activities, lifting with extended arm) as much as possible. Consider physical therapy with transition to home exercise program. Do home exercise program with theraband and scapular stabilization exercises daily 3 sets of 10 once a day. Follow up will depend on the MRI results of your elbow.

## 2022-03-17 NOTE — Progress Notes (Signed)
    SUBJECTIVE:   CHIEF COMPLAINT / HPI:   Left shoulder Patient is a 46 year old female presenting with left shoulder and elbow pain. This has been going on for about a year.   Was having difficulty with movement of the arm especially with reaching to the back. Tried cortisone shot (glenohumeral), shockwave therapy and nitro patches Cortisone shot did not make much improvement but patient says she has been able to have more mobility with the shoulder. Still endorses pain of the left shoulder and reporting some tingling of the left hand which she is not sure if this is related to the tennis elbow.  Left elbow pain She endorses pain on the lateral aspect of the left elbow. Was diagnosed with tennis elbow about a month ago. She received steroid shot which she said improved her pain significantly for a month. However pain has returned and she is also having some numbness and tingling in the hand.   Sometimes she wakes up and feels like her hands went to sleep. 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   PERTINENT  PMH / PSH: Type 1 DM  OBJECTIVE:   BP 108/78   Ht 4' 10.5" (1.486 m)   Wt 118 lb (53.5 kg)   BMI 24.24 kg/m    Physical Exam  General: Alert, well appearing, NAD   Left shoulder No gross deformity No TTP AC joint, biceps tendon.  FROM with painful arc.  5/5 strength IR, ER, flexion/abduction Positive Neer's test.  Negative Hawkins and empty can Sensation intact to light touch bilaterally.   Left elbow exam No notable deformity, effusion or erythema TTP of the lateral epicondyles FROM with normal strength.  Pain with resisted wrist extension. Positive Maudsley test. Sensation intact to light touch bilaterally.   Left wrist Exam No erythema, or edema of the left wrist.  TTP on palmar surface of the radial aspect of the left wrist.  FROM with strength. Normal adduction and  abduction of  interossesi muscles. Positive Tinel's. negative finkelstein, phalens.  Limited MSK u/s left wrist:  Media nerve volume 0.10cm2  Limited MSK u/s left elbow: small area of hypoechoic change common extensor tendon at insertion on lateral epicondyle and neovessel consistent with lateral epicondylitis.  Complete MSK u/s left shoulder: Biceps tendon: intact on long and short views Pec major tendon: intact Subscapularis: intact without abnormalities AC joint: no arthropathy or effusion Infraspinatus: intact without abnormalities Supraspinatus: intact on long and short views Posterior glenohumeral joint: no effusion, paralabral cyst, visible labral tear.  Impression:  Normal ultrasound of left shoulder  ASSESSMENT/PLAN:  Left shoulder pain -  no evidence adhesive capsulitis.  Ultrasound reassuring.  Consistent with impingement.  Home exercises reviewed.  Consider physical therapy. Left lateral epicondylitis - not improving with exercise program, nitro patches, shockwave treatment, injection.  Will go ahead with MRI to further assess. Left carpal tunnel syndrome - mild, ultrasound median nerve volume just at cutoff for this condition.  Wrist brace at bedtime.  Aleve if needed.  Lisa Bleacher, MD [place]

## 2022-03-19 ENCOUNTER — Encounter: Payer: Self-pay | Admitting: Family Medicine

## 2022-03-25 ENCOUNTER — Ambulatory Visit
Admission: RE | Admit: 2022-03-25 | Discharge: 2022-03-25 | Disposition: A | Discharge status: Home / Self Care | Payer: Medicare Other | Source: Ambulatory Visit | Attending: Family Medicine | Admitting: Family Medicine

## 2022-03-25 DIAGNOSIS — M7712 Lateral epicondylitis, left elbow: Secondary | ICD-10-CM

## 2022-03-25 DIAGNOSIS — M25522 Pain in left elbow: Secondary | ICD-10-CM | POA: Diagnosis not present

## 2022-03-27 ENCOUNTER — Ambulatory Visit (HOSPITAL_BASED_OUTPATIENT_CLINIC_OR_DEPARTMENT_OTHER)
Admission: RE | Admit: 2022-03-27 | Discharge: 2022-03-27 | Disposition: A | Discharge status: Home / Self Care | Payer: Medicare Other | Source: Ambulatory Visit | Attending: Family Medicine | Admitting: Family Medicine

## 2022-03-27 DIAGNOSIS — M25522 Pain in left elbow: Secondary | ICD-10-CM | POA: Diagnosis not present

## 2022-03-27 DIAGNOSIS — M8548 Solitary bone cyst, other site: Secondary | ICD-10-CM | POA: Diagnosis not present

## 2022-03-27 DIAGNOSIS — S56512A Strain of other extensor muscle, fascia and tendon at forearm level, left arm, initial encounter: Secondary | ICD-10-CM | POA: Diagnosis not present

## 2022-03-27 DIAGNOSIS — M7712 Lateral epicondylitis, left elbow: Secondary | ICD-10-CM | POA: Diagnosis not present

## 2022-03-28 DIAGNOSIS — E1042 Type 1 diabetes mellitus with diabetic polyneuropathy: Secondary | ICD-10-CM | POA: Diagnosis not present

## 2022-03-29 ENCOUNTER — Ambulatory Visit (INDEPENDENT_AMBULATORY_CARE_PROVIDER_SITE_OTHER): Payer: Medicare Other | Admitting: Family Medicine

## 2022-03-29 ENCOUNTER — Encounter: Payer: Self-pay | Admitting: Family Medicine

## 2022-03-29 VITALS — BP 118/82 | Ht 58.5 in | Wt 118.0 lb

## 2022-03-29 DIAGNOSIS — M7712 Lateral epicondylitis, left elbow: Secondary | ICD-10-CM

## 2022-03-29 MED ORDER — IBUPROFEN 600 MG PO TABS: 600.0000 mg | ORAL_TABLET | Freq: Three times a day (TID) | ORAL | 1 refills | Status: AC | PRN

## 2022-03-29 MED ORDER — TRAMADOL HCL 50 MG PO TABS: 50.0000 mg | ORAL_TABLET | Freq: Four times a day (QID) | ORAL | 0 refills | Status: DC | PRN

## 2022-03-29 NOTE — Progress Notes (Signed)
Patient returned today to go over MRI results and for her severe lateral left elbow pain.  MRI confirmed lateral epicondylitis - moderate in severity with partial tear confirmed that was seen on ultrasound.  She has not improved with exercise program, bracing, nitro patches, shockwave treatment x 4, steroid injection - will refer to Dr. Fredna Dow to discuss operative options.  In meantime tylenol, tramadol, ibuprofen.  Continue with brace.

## 2022-03-29 NOTE — Patient Instructions (Addendum)
You have severe tennis elbow that has not responded to conservative treatment. We will refer you to a surgeon to discuss operative treatment. Tylenol '500mg'$  1-2 tabs three times a day. Ibuprofen '600mg'$  with food three times a day for pain and inflammation. Tramadol as needed for severe pain up to 4 times a day (take at a time of day when you aren't going to be driving or working). Tramadol works better if you take as the same time as tylenol too.   [facility] [place], [place] [address] [phone]  Appt: 04/05/22 @ 10:30 am with Dr. Leanora Cover  Please bring your MRI on a disc to this appt.

## 2022-04-05 DIAGNOSIS — G5602 Carpal tunnel syndrome, left upper limb: Secondary | ICD-10-CM | POA: Diagnosis not present

## 2022-04-05 DIAGNOSIS — M7712 Lateral epicondylitis, left elbow: Secondary | ICD-10-CM | POA: Diagnosis not present

## 2022-04-09 DIAGNOSIS — M7712 Lateral epicondylitis, left elbow: Secondary | ICD-10-CM | POA: Diagnosis not present

## 2022-04-09 DIAGNOSIS — M25632 Stiffness of left wrist, not elsewhere classified: Secondary | ICD-10-CM | POA: Diagnosis not present

## 2022-04-09 DIAGNOSIS — R29898 Other symptoms and signs involving the musculoskeletal system: Secondary | ICD-10-CM | POA: Diagnosis not present

## 2022-04-13 ENCOUNTER — Encounter: Payer: Self-pay | Admitting: Family Medicine

## 2022-04-13 ENCOUNTER — Ambulatory Visit (INDEPENDENT_AMBULATORY_CARE_PROVIDER_SITE_OTHER): Payer: Medicare Other | Admitting: Family Medicine

## 2022-04-13 VITALS — BP 124/78 | HR 79 | Temp 97.5°F | Wt 117.8 lb

## 2022-04-13 DIAGNOSIS — L244 Irritant contact dermatitis due to drugs in contact with skin: Secondary | ICD-10-CM | POA: Diagnosis not present

## 2022-04-13 MED ORDER — TRIAMCINOLONE ACETONIDE 0.5 % EX OINT: 1.0000 | TOPICAL_OINTMENT | Freq: Two times a day (BID) | CUTANEOUS | 3 refills | Status: DC

## 2022-04-13 NOTE — Progress Notes (Signed)
Assessment/Plan:   Problem List Items Addressed This Visit   None Visit Diagnoses     Irritant contact dermatitis due to drug in contact with skin    -  Primary   Relevant Medications   triamcinolone ointment (KENALOG) 0.5 %      Patient with topical irritant dermatitis, likely from either Voltaren gel or IcyHot.  Patient has no other systemic signs of allergy or anaphylaxis.  Patient has significant pain due to tennis elbow and carpal tunnel treatment to include NSAIDs, tramadol, topical analgesic as noted above.  Patient treatment also complicated by having diabetes. Recommend discontinuing Voltaren and IcyHot and other topical agents. Bracing is likely safe to continue, but recommend monitoring for continued irritation Recommend against using systemic oral steroids given diabetes, however will recommend trying topical triamcinolone as prescribed Given complexity and pain, recommend patient follow-up with sports medicine and/or orthopedics to discuss alternatives    Subjective:  HPI:  Lisa Levine is a 46 y.o. female who has Allergic rhinitis; Diabetic neuropathy ([place]); Hyperlipidemia; Low serum vitamin D; Hyperthyroidism; Skin lesion; IDDM (insulin dependent diabetes mellitus) ([place]); Sinusitis, acute maxillary; Syncope; Migraine; Carotid artery syndrome hemispheric; TIA (transient ischemic attack); Hypokalemia; Normocytic normochromic anemia; Lateral epicondylitis of right elbow; Vitamin D deficiency; Type 1 diabetes mellitus ([place]); Thyroid disease; Peripheral neuropathy; Hypertension; DVT (deep venous thrombosis) ([place]); Allergy; Capsulitis of left shoulder; Lateral epicondylitis, left elbow; and Wrist pain, left on their problem list..   She  has a past medical history of Allergy, Arthritis, DVT (deep venous thrombosis) ([place]), Hyperlipidemia, Hypertension, Migraine, Peripheral neuropathy, Thyroid disease, Type 1 diabetes mellitus ([place]), and Vitamin D deficiency..   She presents with  chief complaint of Rash (Left elbow rash x 2.5 days has treated with Voltaren cream and icy hot with no relief. ) .   Rash: Patient complains of rash involving the left arm. Rash started 3 days ago. Appearance of rash at onset: Color of lesion(s): Erythematous, Texture of lesion(s): raised,  Maculopapular . Rash has not changed over time Discomfort associated with rash: is painful and is pruritic causes burning.  Denies: abdominal pain, arthralgia, congestion, cough, crankiness, decrease in appetite, decrease in energy level, fever, headache, irritability, myalgia, nausea, sore throat, and vomiting. Patient has not had previous evaluation of rash. Patient has not had previous treatment.  Patient has not had contacts with similar rash. Patient has identified precipitant.  Patient has tried Benadryl and hydrocortisone topically without improvement.  Of note, patient has pain from tennis elbow and carpel tunnel.  For the past 2 months, patient has been using daily voltaren gel, Icy hot topical ointment and patches, bracing, tramadol, meloxicam and ibuprofen for pain.  She follows with orthopedic and sports medicine for pain mgmt and has steroid injection 12/2021 and is awaiting surgery.  Patient is concerned that if she stops using the topical ointments and bracing that she will not have good pain control. Past Surgical History:  Procedure Laterality Date   insulin pump  2005-present   "upgrades q 3 yrs" (06/04/2016)    Outpatient Medications Prior to Visit  Medication Sig Dispense Refill   acetaminophen (TYLENOL) 500 MG tablet Take 500 mg by mouth 3 (three) times daily as needed for mild pain.     aspirin EC 81 MG tablet Take 1 tablet (81 mg total) by mouth daily. Swallow whole. 90 tablet 3   atorvastatin (LIPITOR) 10 MG tablet Take 1 tablet (10 mg total) by mouth daily. 90 tablet 3   cetirizine (  ZYRTEC) 10 MG tablet Take 10 mg by mouth daily as needed for allergies.     diclofenac Sodium (VOLTAREN) 1 %  GEL APPLY 4 GRAMS TOPICALLY TO THE AFFECTED AREA FOUR TIMES DAILY 100 g 0   gabapentin (NEURONTIN) 600 MG tablet Take 600 mg by mouth 4 (four) times daily.      ibuprofen (ADVIL) 600 MG tablet Take 1 tablet (600 mg total) by mouth every 8 (eight) hours as needed. 90 tablet 1   insulin aspart (NOVOLOG) 100 UNIT/ML injection Use as needed in insulin pump.  MDD: 66 units/day     Insulin Human (INSULIN PUMP) SOLN Inject into the skin continuous. Use with Humalog - basal rate     insulin NPH Human (HUMULIN N,NOVOLIN N) 100 UNIT/ML injection Inject 6-45 Units into the skin See admin instructions. Used withInsulin pump 35-45 units per day per basal rate...Marland KitchenMarland KitchenInject 6-12 units subcutaneously per sliding scale as needed for blood sugar correction with insulin pump     levocetirizine (XYZAL) 5 MG tablet Take 1 tablet (5 mg total) by mouth every evening. 30 tablet 3   PAZEO 0.7 % SOLN Place 1 drop into both eyes daily as needed for allergies.  3   traMADol (ULTRAM) 50 MG tablet Take 1 tablet (50 mg total) by mouth every 6 (six) hours as needed. 20 tablet 0   Vitamin D, Ergocalciferol, (DRISDOL) 1.25 MG (50000 UNIT) CAPS capsule Take 1 capsule (50,000 Units total) by mouth every 7 (seven) days. 8 capsule 0   amitriptyline (ELAVIL) 10 MG tablet Take 1 tablet (10 mg total) by mouth at bedtime. (Patient not taking: Reported on 04/13/2022)     No facility-administered medications prior to visit.    Family History  Problem Relation Age of Onset   Breast cancer Mother    Hyperlipidemia Father    Hypertension Father    Colon cancer Paternal Grandfather    Lung cancer Paternal Uncle        smoler   Liver disease Paternal Uncle     Social History   Socioeconomic History   Marital status: Married    Spouse name: Not on file   Number of children: 3   Years of education: Not on file   Highest education level: Not on file  Occupational History   Occupation: [employer]  Tobacco Use   Smoking  status: Never   Smokeless tobacco: Never  Substance and Sexual Activity   Alcohol use: No   Drug use: No   Sexual activity: Yes  Other Topics Concern   Not on file  Social History Narrative   Not on file   Social Determinants of Health   Financial Resource Strain: Not on file  Food Insecurity: Not on file  Transportation Needs: Not on file  Physical Activity: Not on file  Stress: Not on file  Social Connections: Not on file  Intimate Partner Violence: Not on file                                                                                                 Objective:  Physical Exam: BP 124/78 (BP Location: Left Arm, Patient Position: Sitting, Cuff Size: Large)   Pulse 79   Temp (!) 97.5 F (36.4 C) (Temporal)   Wt 117 lb 12.8 oz (53.4 kg)   SpO2 98%   BMI 24.20 kg/m    General: No acute distress. Awake and conversant.  Eyes: Normal conjunctiva, anicteric. Round symmetric pupils.  ENT: Hearing grossly intact. No nasal discharge.  Neck: Neck is supple. No masses or thyromegaly.  Respiratory: Respirations are non-labored. No auditory wheezing.  Skin: Maculopapular rash without scaling, distribution is left arm from elbow to wrist, mostly on ventral side, with some spreading to the dorsal aspect Psych: Alert and oriented. Cooperative, Appropriate mood and affect, Normal judgment.  CV: No cyanosis or JVD MSK: Normal ambulation. No clubbing  Neuro: Sensation and CN II-XII grossly normal.        Alesia Banda, MD, MS

## 2022-04-21 DIAGNOSIS — M25632 Stiffness of left wrist, not elsewhere classified: Secondary | ICD-10-CM | POA: Diagnosis not present

## 2022-04-21 DIAGNOSIS — R29898 Other symptoms and signs involving the musculoskeletal system: Secondary | ICD-10-CM | POA: Diagnosis not present

## 2022-04-21 DIAGNOSIS — R531 Weakness: Secondary | ICD-10-CM | POA: Diagnosis not present

## 2022-04-21 DIAGNOSIS — R202 Paresthesia of skin: Secondary | ICD-10-CM | POA: Diagnosis not present

## 2022-04-21 DIAGNOSIS — M25522 Pain in left elbow: Secondary | ICD-10-CM | POA: Diagnosis not present

## 2022-04-21 DIAGNOSIS — M25512 Pain in left shoulder: Secondary | ICD-10-CM | POA: Diagnosis not present

## 2022-04-21 DIAGNOSIS — M7712 Lateral epicondylitis, left elbow: Secondary | ICD-10-CM | POA: Diagnosis not present

## 2022-04-27 DIAGNOSIS — E1042 Type 1 diabetes mellitus with diabetic polyneuropathy: Secondary | ICD-10-CM | POA: Diagnosis not present

## 2022-04-28 DIAGNOSIS — M7712 Lateral epicondylitis, left elbow: Secondary | ICD-10-CM | POA: Diagnosis not present

## 2022-04-29 ENCOUNTER — Ambulatory Visit: Payer: Medicare Other | Admitting: Family Medicine

## 2022-04-29 ENCOUNTER — Ambulatory Visit: Payer: Self-pay

## 2022-04-29 ENCOUNTER — Ambulatory Visit (HOSPITAL_BASED_OUTPATIENT_CLINIC_OR_DEPARTMENT_OTHER)
Admission: RE | Admit: 2022-04-29 | Discharge: 2022-04-29 | Disposition: A | Discharge status: Home / Self Care | Payer: Medicare Other | Source: Ambulatory Visit | Attending: Family Medicine | Admitting: Family Medicine

## 2022-04-29 ENCOUNTER — Encounter: Payer: Self-pay | Admitting: Family Medicine

## 2022-04-29 ENCOUNTER — Other Ambulatory Visit: Payer: Self-pay | Admitting: Orthopedic Surgery

## 2022-04-29 VITALS — BP 118/68 | Ht 58.5 in | Wt 118.0 lb

## 2022-04-29 DIAGNOSIS — M5412 Radiculopathy, cervical region: Secondary | ICD-10-CM | POA: Diagnosis not present

## 2022-04-29 DIAGNOSIS — M542 Cervicalgia: Secondary | ICD-10-CM | POA: Diagnosis not present

## 2022-04-29 DIAGNOSIS — M7502 Adhesive capsulitis of left shoulder: Secondary | ICD-10-CM

## 2022-04-29 MED ORDER — BETAMETHASONE SOD PHOS & ACET 6 (3-3) MG/ML IJ SUSP
6.0000 mg | Freq: Once | INTRAMUSCULAR | Status: AC
  Administered 2022-04-29: 6 mg via INTRA_ARTICULAR

## 2022-04-29 NOTE — Patient Instructions (Signed)
Good to see you Please alternate heat before exercise and ice after  Please try the exercises  We'll call with the neck xrays We'll get the MRi at [facility]   Please send me a message in MyChart with any questions or updates.  We'll setup a virtual visit after the MRI is resulted.   --Dr. Raeford Razor

## 2022-04-29 NOTE — Assessment & Plan Note (Signed)
Acute on chronic in nature.  Continues to have limited external rotation.  We have tried 1 injection thus far. -Counseled on home exercise therapy and supportive care. -Hydro dilation today. -That her nerve block or physical therapy.

## 2022-04-29 NOTE — Progress Notes (Signed)
  Lisa Levine - 46 y.o. female MRN [id number]  Date of birth: 1976/04/20  SUBJECTIVE:  Including CC & ROS.  No chief complaint on file.   Lisa Levine is a 46 y.o. female that is presenting with acute on chronic left shoulder pain and left arm pain.  A recent EMG that suggested a a C6 radiculopathy.  Her symptoms have been ongoing for several months.  She has completed physical therapy for her left arm.  Continues to have left shoulder pain.    Review of Systems See HPI   HISTORY: Past Medical, Surgical, Social, and Family History Reviewed & Updated per EMR.   Pertinent Historical Findings include:  Past Medical History:  Diagnosis Date   Allergy    Arthritis    DVT (deep venous thrombosis) ([place])    "often on my LLE" (06/04/2016)   Hyperlipidemia    Hypertension    Migraine    "q couple months" (06/04/2016)   Peripheral neuropathy    Thyroid disease    Type 1 diabetes mellitus ([place])    Vitamin D deficiency     Past Surgical History:  Procedure Laterality Date   insulin pump  2005-present   "upgrades q 3 yrs" (06/04/2016)     PHYSICAL EXAM:  VS: BP 118/68   Ht 4' 10.5" (1.486 m)   Wt 118 lb (53.5 kg)   BMI 24.24 kg/m  Physical Exam Gen: NAD, alert, cooperative with exam, well-appearing MSK:  Cervical spine: Limited lateral rotation to the left. Limited flexion and extension. Positive Spurling's test. Neurovascularly intact     Aspiration/Injection Procedure Note Chava T Galindo [birth year]  Procedure: Injection Indications: Left shoulder pain  Procedure Details Consent: Risks of procedure as well as the alternatives and risks of each were explained to the (patient/caregiver).  Consent for procedure obtained. Time Out: Verified patient identification, verified procedure, site/side was marked, verified correct patient position, special equipment/implants available, medications/allergies/relevent history reviewed, required imaging and test results available.  Performed.  The  area was cleaned with iodine and alcohol swabs.    The left glenohumeral joint was injected using 5 cc of 1% lidocaine on a 22-gauge 3-1/2 inch needle.  The syringe was switched to mixture containing 1 cc's of 6 mg betamethasone, 15 cc of normal saline and 5 cc's of 0.25% bupivacaine was injected.  Ultrasound was used. Images were obtained in long views showing the injection.     A sterile dressing was applied.  Patient did tolerate procedure well.     ASSESSMENT & PLAN:   Cervical radiculopathy Acute on chronic in nature.  Having sensation that originates at the cervical spine with altered sensation in the hand.  Recent EMG was demonstrating a C6 radiculopathy.  Has completed greater than 6 weeks of physician directed home exercise therapy.  Has tried medications. -Counseled on home exercise therapy and supportive care. -X-ray. -MRI of the cervical spine to evaluate for nerve impingement and epidural use.  Adhesive capsulitis of left shoulder Acute on chronic in nature.  Continues to have limited external rotation.  We have tried 1 injection thus far. -Counseled on home exercise therapy and supportive care. -Hydro dilation today. -That her nerve block or physical therapy.

## 2022-04-29 NOTE — Assessment & Plan Note (Signed)
Acute on chronic in nature.  Having sensation that originates at the cervical spine with altered sensation in the hand.  Recent EMG was demonstrating a C6 radiculopathy.  Has completed greater than 6 weeks of physician directed home exercise therapy.  Has tried medications. -Counseled on home exercise therapy and supportive care. -X-ray. -MRI of the cervical spine to evaluate for nerve impingement and epidural use.

## 2022-05-03 ENCOUNTER — Encounter: Payer: Self-pay | Admitting: Family Medicine

## 2022-05-04 ENCOUNTER — Telehealth: Payer: Self-pay | Admitting: Family Medicine

## 2022-05-04 NOTE — Telephone Encounter (Signed)
Called patient  Rosemarie Ax, MD [facility] 05/04/2022, 1:52 PM

## 2022-05-18 ENCOUNTER — Encounter: Payer: Self-pay | Admitting: Family Medicine

## 2022-05-20 ENCOUNTER — Ambulatory Visit
Admission: RE | Admit: 2022-05-20 | Discharge: 2022-05-20 | Disposition: A | Discharge status: Home / Self Care | Payer: Medicare Other | Source: Ambulatory Visit | Attending: Family Medicine | Admitting: Family Medicine

## 2022-05-20 DIAGNOSIS — Z8669 Personal history of other diseases of the nervous system and sense organs: Secondary | ICD-10-CM | POA: Diagnosis not present

## 2022-05-20 DIAGNOSIS — M47812 Spondylosis without myelopathy or radiculopathy, cervical region: Secondary | ICD-10-CM | POA: Diagnosis not present

## 2022-05-20 DIAGNOSIS — M50222 Other cervical disc displacement at C5-C6 level: Secondary | ICD-10-CM | POA: Diagnosis not present

## 2022-05-20 DIAGNOSIS — M5021 Other cervical disc displacement,  high cervical region: Secondary | ICD-10-CM | POA: Diagnosis not present

## 2022-05-20 DIAGNOSIS — M5412 Radiculopathy, cervical region: Secondary | ICD-10-CM

## 2022-05-26 ENCOUNTER — Encounter: Payer: Self-pay | Admitting: Family Medicine

## 2022-05-26 ENCOUNTER — Telehealth (INDEPENDENT_AMBULATORY_CARE_PROVIDER_SITE_OTHER): Payer: Medicare Other | Admitting: Family Medicine

## 2022-05-26 VITALS — Ht 58.5 in | Wt 118.0 lb

## 2022-05-26 DIAGNOSIS — M5412 Radiculopathy, cervical region: Secondary | ICD-10-CM | POA: Diagnosis not present

## 2022-05-26 NOTE — Assessment & Plan Note (Signed)
MRi was reassuring but does appear that the left side impingement as she report ongoing left arm pain.  - counseled on supportive care and home exercise therapy  - pursue epidural.

## 2022-05-26 NOTE — Progress Notes (Signed)
Virtual Visit via Video Note  I connected with Lisa Levine on 05/26/22 at  8:00 AM EST by a video enabled telemedicine application and verified that I am speaking with the correct person using two identifiers.  Location: Patient: work Provider: office   I discussed the limitations of evaluation and management by telemedicine and the availability of in person appointments. The patient expressed understanding and agreed to proceed.  History of Present Illness:  [name] is a 47 yo F that is following up after the MRi of the cervical spine. The MRi is read as normal but there is narrowing at the C6 nerve root that would correlate with her EMG.    Observations/Objective:   Assessment and Plan:  Cervical radiculopathy:  MRi was reassuring but does appear that the left side impingement as she report ongoing left arm pain.  - counseled on supportive care and home exercise therapy  - pursue epidural.   Follow Up Instructions:    I discussed the assessment and treatment plan with the patient. The patient was provided an opportunity to ask questions and all were answered. The patient agreed with the plan and demonstrated an understanding of the instructions.   The patient was advised to call back or seek an in-person evaluation if the symptoms worsen or if the condition fails to improve as anticipated.   Clearance Coots, MD

## 2022-05-27 DIAGNOSIS — E1042 Type 1 diabetes mellitus with diabetic polyneuropathy: Secondary | ICD-10-CM | POA: Diagnosis not present

## 2022-06-01 ENCOUNTER — Ambulatory Visit
Admission: RE | Admit: 2022-06-01 | Discharge: 2022-06-01 | Disposition: A | Discharge status: Home / Self Care | Payer: Medicare Other | Source: Ambulatory Visit | Attending: Family Medicine | Admitting: Family Medicine

## 2022-06-01 DIAGNOSIS — M5412 Radiculopathy, cervical region: Secondary | ICD-10-CM

## 2022-06-01 DIAGNOSIS — M47812 Spondylosis without myelopathy or radiculopathy, cervical region: Secondary | ICD-10-CM | POA: Diagnosis not present

## 2022-06-01 MED ORDER — TRIAMCINOLONE ACETONIDE 40 MG/ML IJ SUSP (RADIOLOGY)
60.0000 mg | Freq: Once | INTRAMUSCULAR | Status: AC
  Administered 2022-06-01: 60 mg via EPIDURAL

## 2022-06-01 MED ORDER — IOPAMIDOL (ISOVUE-M 300) INJECTION 61%
1.0000 mL | Freq: Once | INTRAMUSCULAR | Status: AC
  Administered 2022-06-01: 1 mL via EPIDURAL

## 2022-06-01 NOTE — Discharge Instructions (Signed)

## 2022-06-02 ENCOUNTER — Encounter (HOSPITAL_BASED_OUTPATIENT_CLINIC_OR_DEPARTMENT_OTHER): Payer: Self-pay | Admitting: Orthopedic Surgery

## 2022-06-09 NOTE — Progress Notes (Signed)
Texted pt. Reminder for coming in for labs and pre-surgical drink pick up. and she texted and states surgery for tomorrow has been cancelled.

## 2022-06-10 ENCOUNTER — Ambulatory Visit (HOSPITAL_BASED_OUTPATIENT_CLINIC_OR_DEPARTMENT_OTHER): Admission: RE | Admit: 2022-06-10 | Payer: Medicare Other | Source: Home / Self Care | Admitting: Orthopedic Surgery

## 2022-06-10 DIAGNOSIS — E109 Type 1 diabetes mellitus without complications: Secondary | ICD-10-CM

## 2022-06-10 SURGERY — TENNIS ELBOW RELEASE/NIRSCHEL PROCEDURE
Anesthesia: Choice | Site: Elbow | Laterality: Left

## 2022-06-16 DIAGNOSIS — E569 Vitamin deficiency, unspecified: Secondary | ICD-10-CM | POA: Diagnosis not present

## 2022-06-16 DIAGNOSIS — E1042 Type 1 diabetes mellitus with diabetic polyneuropathy: Secondary | ICD-10-CM | POA: Diagnosis not present

## 2022-06-22 ENCOUNTER — Ambulatory Visit: Payer: Medicare Other | Admitting: Family Medicine

## 2022-06-22 ENCOUNTER — Encounter: Payer: Self-pay | Admitting: Family Medicine

## 2022-06-22 VITALS — BP 118/76 | Ht 58.5 in | Wt 117.0 lb

## 2022-06-22 DIAGNOSIS — M7502 Adhesive capsulitis of left shoulder: Secondary | ICD-10-CM | POA: Diagnosis not present

## 2022-06-22 DIAGNOSIS — M5412 Radiculopathy, cervical region: Secondary | ICD-10-CM

## 2022-06-22 MED ORDER — DICLOFENAC SODIUM 1 % EX GEL: CUTANEOUS | 5 refills | Status: AC

## 2022-06-22 NOTE — Patient Instructions (Signed)
Good to see you Please continue working on the range of motion  We'll make a referral for the suprascapular nerve block. They should give you a call to schedule   Please send me a message in MyChart with any questions or updates.  Please see me back as needed.   --Dr. Raeford Razor

## 2022-06-22 NOTE — Progress Notes (Unsigned)
  Lisa Levine - 47 y.o. female MRN [id number]  Date of birth: 1975/10/15  SUBJECTIVE:  Including CC & ROS.  No chief complaint on file.   Lisa Levine is a 47 y.o. female that is following up for her radicular pain and left frozen shoulder.  The epidural has relieved the pain that she is experiencing down the left arm.  She still experiences anterior shoulder pain.  We have tried a steroid injection and Hydro dilation for the left shoulder.  She has been doing physical therapy at the [place].   Review of Systems See HPI   HISTORY: Past Medical, Surgical, Social, and Family History Reviewed & Updated per EMR.   Pertinent Historical Findings include:  Past Medical History:  Diagnosis Date   Allergy    Arthritis    DVT (deep venous thrombosis) ([facility])    "often on my LLE" (06/04/2016)   Hyperlipidemia    Hypertension    Migraine    "q couple months" (06/04/2016)   Peripheral neuropathy    Thyroid disease    Type 1 diabetes mellitus ([place])    Vitamin D deficiency     Past Surgical History:  Procedure Laterality Date   insulin pump  2005-present   "upgrades q 3 yrs" (06/04/2016)     PHYSICAL EXAM:  VS: BP 118/76   Ht 4' 10.5" (1.486 m)   Wt 117 lb (53.1 kg)   LMP 06/02/2010 (Approximate) Comment: pt states she has not had a period in 12-13 years  BMI 24.04 kg/m  Physical Exam Gen: NAD, alert, cooperative with exam, well-appearing MSK:  Neurovascularly intact       ASSESSMENT & PLAN:   Cervical radiculopathy Doing well since the epidural.  This has resolved the pain of the elbow as well as in her wrist. -Counseled on home exercise therapy and supportive care. -Can repeat epidural if needed.  Adhesive capsulitis of left shoulder Continues to have limited range of motion and tightness on exam.  We have tried a steroid injection and dilation of the capsule.  She did have a recent fall which could have exacerbated -Counseled on home exercise therapy and supportive care. -Referral  to for consideration of a suprascapular nerve ablation.

## 2022-06-22 NOTE — Assessment & Plan Note (Signed)
Doing well since the epidural.  This has resolved the pain of the elbow as well as in her wrist. -Counseled on home exercise therapy and supportive care. -Can repeat epidural if needed.

## 2022-06-22 NOTE — Assessment & Plan Note (Signed)
Continues to have limited range of motion and tightness on exam.  We have tried a steroid injection and dilation of the capsule.  She did have a recent fall which could have exacerbated -Counseled on home exercise therapy and supportive care. -Referral to for consideration of a suprascapular nerve ablation.

## 2022-06-28 DIAGNOSIS — R300 Dysuria: Secondary | ICD-10-CM | POA: Diagnosis not present

## 2022-06-28 DIAGNOSIS — B3731 Acute candidiasis of vulva and vagina: Secondary | ICD-10-CM | POA: Diagnosis not present

## 2022-06-29 ENCOUNTER — Encounter: Payer: Self-pay | Admitting: Student in an Organized Health Care Education/Training Program

## 2022-06-29 ENCOUNTER — Ambulatory Visit
Payer: Medicare Other | Attending: Student in an Organized Health Care Education/Training Program | Admitting: Student in an Organized Health Care Education/Training Program

## 2022-06-29 VITALS — BP 118/77 | HR 80 | Temp 98.6°F | Resp 16 | Ht 58.5 in | Wt 118.0 lb

## 2022-06-29 DIAGNOSIS — M25512 Pain in left shoulder: Secondary | ICD-10-CM | POA: Diagnosis not present

## 2022-06-29 DIAGNOSIS — G5682 Other specified mononeuropathies of left upper limb: Secondary | ICD-10-CM | POA: Insufficient documentation

## 2022-06-29 DIAGNOSIS — G5681 Other specified mononeuropathies of right upper limb: Secondary | ICD-10-CM | POA: Insufficient documentation

## 2022-06-29 DIAGNOSIS — M7502 Adhesive capsulitis of left shoulder: Secondary | ICD-10-CM | POA: Insufficient documentation

## 2022-06-29 DIAGNOSIS — G8929 Other chronic pain: Secondary | ICD-10-CM | POA: Insufficient documentation

## 2022-06-29 DIAGNOSIS — E1042 Type 1 diabetes mellitus with diabetic polyneuropathy: Secondary | ICD-10-CM | POA: Diagnosis not present

## 2022-06-29 NOTE — Progress Notes (Signed)
Patient: Lisa Levine  Service Category: E/M  Provider: Gillis Santa, MD  DOB: August 13, 1975  DOS: 06/29/2022  Referring Provider: Rosemarie Ax, MD  MRN: [id number]  Setting: Ambulatory outpatient  PCP: Mackie Pai, PA-C  Type: New Patient  Specialty: Interventional Pain Management    Location: Office  Delivery: Face-to-face     Primary Reason(s) for Visit: Encounter for initial evaluation of one or more chronic problems (new to examiner) potentially causing chronic pain, and posing a threat to normal musculoskeletal function. (Level of risk: High) CC: Shoulder Pain (Left )  HPI  Lisa Levine is a 47 y.o. year old, female patient, who comes for the first time to our practice referred by Rosemarie Ax, MD for our initial evaluation of her chronic pain. She has Allergic rhinitis; Diabetic neuropathy ([place]); Hyperlipidemia; Low serum vitamin D; Hyperthyroidism; Skin lesion; IDDM (insulin dependent diabetes mellitus) ([place]); Sinusitis, acute maxillary; Syncope; Migraine; Carotid artery syndrome hemispheric; TIA (transient ischemic attack); Hypokalemia; Normocytic normochromic anemia; Lateral epicondylitis of right elbow; Vitamin D deficiency; Type 1 diabetes mellitus ([place]); Thyroid disease; Peripheral neuropathy; Hypertension; DVT (deep venous thrombosis) ([place]); Allergy; Adhesive capsulitis of left shoulder; Lateral epicondylitis, left elbow; Wrist pain, left; Cervical radiculopathy; Suprascapular nerve entrapment, left; and Chronic left shoulder pain on their problem list. Today she comes in for evaluation of her Shoulder Pain (Left )  Pain Assessment: Location: Left Shoulder Radiating: down the left arm Onset: More than a month ago Duration: Chronic pain Quality: Discomfort, Constant, Sharp Severity: 8 /10 (subjective, self-reported pain score)  Effect on ADL: liimited ROM  sharp pain with movement. Timing: Constant Modifying factors: nothing currently BP: 118/77  HR: 80  Onset and Duration:  Present longer than 3 months Cause of pain: Unknown Severity: Getting worse and NAS-11 at its worse: 8/10 Timing: Morning, During activity or exercise, and After a period of immobility Aggravating Factors: Lifiting and Motion Alleviating Factors: Cold packs, Hot packs, and Chiropractic manipulations Associated Problems: Numbness, Pain that wakes patient up, and Pain that does not allow patient to sleep Quality of Pain: Aching, Sharp, and Throbbing Previous Examinations or Tests: MRI scan Previous Treatments: Chiropractic manipulations, Epidural steroid injections, and Physical Therapy  Lisa Levine is being evaluated for possible interventional pain management therapies for the treatment of her chronic pain.   History of left shoulder pain related to frozen shoulder, adhesive capsulitis that results in decreased mobility and limited range of motion.  She has significant left shoulder pain.  She has tried left shoulder glenohumeral joint injection with limited response.  She has done physical therapy, chiropractic therapy, epidural steroid injection.  She has tried acetaminophen, Voltaren gel, is currently on gabapentin.  She is being referred here for consideration of left suprascapular nerve block and possible RFA.  Meds   Current Outpatient Medications:    acetaminophen (TYLENOL) 500 MG tablet, Take 500 mg by mouth 3 (three) times daily as needed for mild pain., Disp: , Rfl:    atorvastatin (LIPITOR) 10 MG tablet, Take 1 tablet (10 mg total) by mouth daily., Disp: 90 tablet, Rfl: 3   cetirizine (ZYRTEC) 10 MG tablet, Take 10 mg by mouth daily as needed for allergies., Disp: , Rfl:    chlorhexidine (PERIDEX) 0.12 % solution, Use as directed 5 mLs in the mouth or throat 2 (two) times daily., Disp: , Rfl:    diclofenac Sodium (VOLTAREN) 1 % GEL, APPLY 4 GRAMS TOPICALLY TO THE AFFECTED AREA FOUR TIMES DAILY, Disp: 100 g, Rfl: 5  fluconazole (DIFLUCAN) 150 MG tablet, Take 150 mg by mouth once., Disp:  , Rfl:    gabapentin (NEURONTIN) 600 MG tablet, Take 600 mg by mouth 4 (four) times daily. , Disp: , Rfl:    insulin aspart (NOVOLOG) 100 UNIT/ML injection, Use as needed in insulin pump.  MDD: 66 units/day, Disp: , Rfl:    Insulin Human (INSULIN PUMP) SOLN, Inject into the skin continuous. Use with Humalog - basal rate, Disp: , Rfl:    insulin NPH Human (HUMULIN N,NOVOLIN N) 100 UNIT/ML injection, Inject 6-45 Units into the skin See admin instructions. Used withInsulin pump 35-45 units per day per basal rate...Marland KitchenMarland KitchenInject 6-12 units subcutaneously per sliding scale as needed for blood sugar correction with insulin pump, Disp: , Rfl:    traMADol (ULTRAM) 50 MG tablet, Take 1 tablet (50 mg total) by mouth every 6 (six) hours as needed., Disp: 20 tablet, Rfl: 0   triamcinolone ointment (KENALOG) 0.5 %, Apply 1 Application topically 2 (two) times daily. For moderate to severe eczema.  Do not use for more than 1 week at a time., Disp: 60 g, Rfl: 3   Vitamin D, Ergocalciferol, (DRISDOL) 1.25 MG (50000 UNIT) CAPS capsule, Take 1 capsule (50,000 Units total) by mouth every 7 (seven) days., Disp: 8 capsule, Rfl: 0   amitriptyline (ELAVIL) 10 MG tablet, Take 1 tablet (10 mg total) by mouth at bedtime. (Patient not taking: Reported on 04/13/2022), Disp: , Rfl:    ibuprofen (ADVIL) 600 MG tablet, Take 1 tablet (600 mg total) by mouth every 8 (eight) hours as needed. (Patient not taking: Reported on 06/29/2022), Disp: 90 tablet, Rfl: 1   levocetirizine (XYZAL) 5 MG tablet, Take 1 tablet (5 mg total) by mouth every evening. (Patient not taking: Reported on 06/29/2022), Disp: 30 tablet, Rfl: 3  Imaging Review  Cervical Imaging: Cervical MR wo contrast: Results for orders placed during the hospital encounter of 05/20/22  MR CERVICAL SPINE WO CONTRAST  Narrative CLINICAL DATA:  47 year old female with neck, shoulder, elbow pain for 7-8 months. Myelopathy.  EXAM: MRI CERVICAL SPINE WITHOUT  CONTRAST  TECHNIQUE: Multiplanar, multisequence MR imaging of the cervical spine was performed. No intravenous contrast was administered.  COMPARISON:  Cervical spine radiographs 04/29/2022. Chest CTA 09/02/2020.  FINDINGS: Alignment: Straightening of cervical lordosis stable from the prior radiographs. Cervicothoracic junction alignment is within normal limits.  Vertebrae: No cervical marrow edema or evidence of acute osseous abnormality. Background T1 bone marrow signal is at the lower limits of normal. A small round and slightly granular T2/STIR hyperintense 5-6 mm area in the visible anterior T3 vertebral body is most likely a benign hemangioma with atypical T1 signal - and is faintly apparent on CTA chest last year.  Cord: Normal.  Capacious spinal canal.  Posterior Fossa, vertebral arteries, paraspinal tissues: Cervicomedullary junction is within normal limits. Negative visible posterior fossa. Preserved major vascular flow voids in the neck. Negative visible neck soft tissues and lung apices.  Disc levels:  C2-C3:  Mild facet hypertrophy on the left.  Otherwise negative.  C3-C4: Subtle disc desiccation and disc bulging. Mild left facet hypertrophy. No stenosis.  C4-C5:  Negative.  C5-C6:  Subtle disc bulging.  Otherwise negative.  C6-C7:  Negative.  C7-T1:  Negative.  Negative visible upper thoracic levels.  IMPRESSION: Essentially normal for age MRI appearance of the Cervical Spine. Capacious spinal canal with minimal degeneration and no spinal stenosis or neural impingement.   Electronically Signed By: Genevie Ann M.D. On: 05/25/2022 05:49  MR CERVICAL SPINE W WO CONTRAST  Narrative CLINICAL DATA:  History of migraine headaches. Acute onset numbness and tingling of the right hand.  EXAM: MRI HEAD WITHOUT AND WITH CONTRAST  MRI CERVICAL SPINE WITHOUT AND WITH CONTRAST  TECHNIQUE: Multiplanar, multiecho pulse sequences of the brain and  surrounding structures, and cervical spine, to include the craniocervical junction and cervicothoracic junction, were obtained without and with intravenous contrast.  CONTRAST:  6m MULTIHANCE GADOBENATE DIMEGLUMINE 529 MG/ML IV SOLN  COMPARISON:  1. Head CT 10/27/2016 2. Brain MRI 06/05/2016  FINDINGS: MRI HEAD FINDINGS  Brain: The midline structures are normal. There is no focal diffusion restriction to indicate acute infarct. Scattered nonspecific foci of hyperintense T2 weighted signal in the subcortical and deep white matter are unchanged. No intraparenchymal hematoma or chronic microhemorrhage. Brain volume is normal for age without age-advanced or lobar predominant atrophy. The dura is normal and there is no extra-axial collection. No contrast-enhancing lesions.  Vascular: Major intracranial arterial and venous sinus flow voids are preserved.  Skull and upper cervical spine: The visualized skull base, calvarium, upper cervical spine and extracranial soft tissues are normal.  Sinuses/Orbits: No fluid levels or advanced mucosal thickening. No mastoid effusion. Normal orbits.  MRI CERVICAL SPINE FINDINGS  Alignment: Normal  Vertebrae: Normal  Cord: Normal signal and caliber.  Posterior Fossa, vertebral arteries, paraspinal tissues: Normal  Disc levels: No cervical spinal canal stenosis or neural foraminal stenosis.  IMPRESSION: 1. Unchanged appearance of multiple nonspecific white matter hyperintensities in the brain. Differential considerations remain broad, including migraine headaches, chronic microangiopathy, demyelinating disease or sequelae of remote infection/inflammation. No contrast-enhancing lesions. 2. Normal MRI of the cervical spine.   Electronically Signed By: KUlyses JarredM.D. On: 10/27/2016 16:48   Narrative CLINICAL DATA:  Neck pain  EXAM: CERVICAL SPINE - 2-3 VIEW  COMPARISON:  MRI 10/27/2016  FINDINGS: Straightening of the  cervical spine. Vertebral body heights and disc spaces appear normal. Dens and lateral masses are within normal limits  IMPRESSION: Straightening of the cervical spine. No significant degenerative change.   Electronically Signed By: KDonavan FoilM.D. On: 04/30/2022 21:29  DG Elbow Complete Right  Narrative CLINICAL DATA:  Chronic right elbow pain which has worsened over the past month. No known injury.  EXAM: RIGHT ELBOW - COMPLETE 3+ VIEW  COMPARISON:  Plain films right elbow 11/25/2016.  FINDINGS: There is no evidence of fracture, dislocation, or joint effusion. There is no evidence of arthropathy or other focal bone abnormality. Soft tissues are unremarkable.  IMPRESSION: Negative exam.   Electronically Signed By: TInge RiseM.D. On: 09/01/2018 09:15  Complexity Note: Imaging results reviewed.                         ROS  Cardiovascular: No reported cardiovascular signs or symptoms such as High blood pressure, coronary artery disease, abnormal heart rate or rhythm, heart attack, blood thinner therapy or heart weakness and/or failure Pulmonary or Respiratory: No reported pulmonary signs or symptoms such as wheezing and difficulty taking a deep full breath (Asthma), difficulty blowing air out (Emphysema), coughing up mucus (Bronchitis), persistent dry cough, or temporary stoppage of breathing during sleep Neurological: No reported neurological signs or symptoms such as seizures, abnormal skin sensations, urinary and/or fecal incontinence, being born with an abnormal open spine and/or a tethered spinal cord Psychological-Psychiatric: No reported psychological or psychiatric signs or symptoms such as difficulty sleeping, anxiety, depression, delusions or hallucinations (schizophrenial), mood swings (bipolar  disorders) or suicidal ideations or attempts Gastrointestinal: No reported gastrointestinal signs or symptoms such as vomiting or evacuating blood, reflux,  heartburn, alternating episodes of diarrhea and constipation, inflamed or scarred liver, or pancreas or irrregular and/or infrequent bowel movements Genitourinary: No reported renal or genitourinary signs or symptoms such as difficulty voiding or producing urine, peeing blood, non-functioning kidney, kidney stones, difficulty emptying the bladder, difficulty controlling the flow of urine, or chronic kidney disease Hematological: No reported hematological signs or symptoms such as prolonged bleeding, low or poor functioning platelets, bruising or bleeding easily, hereditary bleeding problems, low energy levels due to low hemoglobin or being anemic Endocrine: No reported endocrine signs or symptoms such as high or low blood sugar, rapid heart rate due to high thyroid levels, obesity or weight gain due to slow thyroid or thyroid disease Rheumatologic: No reported rheumatological signs and symptoms such as fatigue, joint pain, tenderness, swelling, redness, heat, stiffness, decreased range of motion, with or without associated rash Musculoskeletal: Negative for myasthenia gravis, muscular dystrophy, multiple sclerosis or malignant hyperthermia Work History: Working full time  Allergies  Ms. Skillman has No Known Allergies.  Laboratory Chemistry Profile   Renal Lab Results  Component Value Date   BUN 11 12/15/2021   CREATININE 0.64 12/15/2021   BCR 15 10/23/2020   GFR 94.70 09/30/2021   GFRAA >60 10/25/2017   GFRNONAA >60 12/15/2021   PROTEINUR NEGATIVE 12/15/2021     Electrolytes Lab Results  Component Value Date   NA 135 12/15/2021   K 3.6 12/15/2021   CL 101 12/15/2021   CALCIUM 8.5 (L) 12/15/2021   MG 1.9 10/27/2016     Hepatic Lab Results  Component Value Date   AST 17 12/15/2021   ALT 18 12/15/2021   ALBUMIN 4.0 12/15/2021   ALKPHOS 49 12/15/2021   LIPASE 31 10/25/2017     ID Lab Results  Component Value Date   PREGTESTUR NEGATIVE 12/15/2021     Bone Lab Results   Component Value Date   VD25OH 18.54 (L) 09/30/2021   VD125OH2TOT 51 04/23/2021   [id number] 51 04/23/2021   [id number] <8 04/23/2021     Endocrine Lab Results  Component Value Date   GLUCOSE 225 (H) 12/15/2021   GLUCOSEU NEGATIVE 12/15/2021   HGBA1C 7.8 (H) 09/05/2018   TSH 1.50 08/21/2020   FREET4 0.94 08/21/2020     Neuropathy Lab Results  Component Value Date   VITAMINB12 1,016 (H) 04/23/2021   HGBA1C 7.8 (H) 09/05/2018     CNS No results found for: "COLORCSF", "APPEARCSF", "RBCCOUNTCSF", "WBCCSF", "POLYSCSF", "LYMPHSCSF", "EOSCSF", "PROTEINCSF", "GLUCCSF", "JCVIRUS", "CSFOLI", "IGGCSF", "LABACHR", "ACETBL"   Inflammation (CRP: Acute  ESR: Chronic) Lab Results  Component Value Date   CRP 0.7 10/16/2015   ESRSEDRATE 13 10/16/2015     Rheumatology Lab Results  Component Value Date   RF <10 10/16/2015   ANA POS (A) 10/16/2015     Coagulation Lab Results  Component Value Date   INR 0.93 10/27/2016   LABPROT 12.5 10/27/2016   APTT 31 10/27/2016   PLT 366 12/15/2021   DDIMER 0.41 10/25/2017     Cardiovascular Lab Results  Component Value Date   TROPONINI <0.03 10/25/2017   HGB 14.0 12/15/2021   HCT 41.4 12/15/2021     Screening Lab Results  Component Value Date   PREGTESTUR NEGATIVE 12/15/2021     Cancer No results found for: "CEA", "CA125", "LABCA2"   Allergens No results found for: "ALMOND", "APPLE", "ASPARAGUS", "AVOCADO", "BANANA", "BARLEY", "BASIL", "BAYLEAF", "GREENBEAN", "  LIMABEAN", "WHITEBEAN", "BEEFIGE", "REDBEET", "BLUEBERRY", "BROCCOLI", "CABBAGE", "MELON", "CARROT", "CASEIN", "CASHEWNUT", "CAULIFLOWER", "CELERY"     Note: Lab results reviewed.  [place]  Drug: Ms. Mick  reports no history of drug use. Alcohol:  reports no history of alcohol use. Tobacco:  reports that she has never smoked. She has never used smokeless tobacco. Medical:  has a past medical history of Allergy, Arthritis, DVT (deep venous thrombosis) ([place]), Hyperlipidemia,  Hypertension, Migraine, Peripheral neuropathy, Thyroid disease, Type 1 diabetes mellitus ([place]), and Vitamin D deficiency. Family: family history includes Breast cancer in her mother; Colon cancer in her paternal grandfather; Hyperlipidemia in her father; Hypertension in her father; Liver disease in her paternal uncle; Lung cancer in her paternal uncle.  Past Surgical History:  Procedure Laterality Date   insulin pump  2005-present   "upgrades q 3 yrs" (06/04/2016)   Active Ambulatory Problems    Diagnosis Date Noted   Allergic rhinitis 02/14/2015   Diabetic neuropathy ([place]) 02/14/2015   Hyperlipidemia 02/14/2015   Low serum vitamin D 02/14/2015   Hyperthyroidism 02/14/2015   Skin lesion 02/14/2015   IDDM (insulin dependent diabetes mellitus) ([place]) 02/14/2015   Sinusitis, acute maxillary 05/22/2015   Syncope 06/04/2016   Migraine 06/05/2016   Carotid artery syndrome hemispheric 10/27/2016   TIA (transient ischemic attack) 10/27/2016   Hypokalemia 10/27/2016   Normocytic normochromic anemia 10/27/2016   Lateral epicondylitis of right elbow 11/01/2018   Vitamin D deficiency    Type 1 diabetes mellitus ([facility])    Thyroid disease    Peripheral neuropathy    Hypertension    DVT (deep venous thrombosis) ([facility])    Allergy    Adhesive capsulitis of left shoulder 12/09/2021   Lateral epicondylitis, left elbow 12/09/2021   Wrist pain, left 03/17/2022   Cervical radiculopathy 04/29/2022   Suprascapular nerve entrapment, left 06/29/2022   Chronic left shoulder pain 06/29/2022   Resolved Ambulatory Problems    Diagnosis Date Noted   Bursitis 10/27/2016   Arthritis    No Additional Past Medical History   Constitutional Exam  General appearance: Well nourished, well developed, and well hydrated. In no apparent acute distress Vitals:   06/29/22 1304  BP: 118/77  Pulse: 80  Resp: 16  Temp: 98.6 F (37 C)  TempSrc: Temporal  SpO2: 100%  Weight: 118 lb (53.5 kg)  Height: 4' 10.5"  (1.486 m)   BMI Assessment: Estimated body mass index is 24.24 kg/m as calculated from the following:   Height as of this encounter: 4' 10.5" (1.486 m).   Weight as of this encounter: 118 lb (53.5 kg).  BMI interpretation table: BMI level Category Range association with higher incidence of chronic pain  <18 kg/m2 Underweight   18.5-24.9 kg/m2 Ideal body weight   25-29.9 kg/m2 Overweight Increased incidence by 20%  30-34.9 kg/m2 Obese (Class I) Increased incidence by 68%  35-39.9 kg/m2 Severe obesity (Class II) Increased incidence by 136%  >40 kg/m2 Extreme obesity (Class III) Increased incidence by 254%   Patient's current BMI Ideal Body weight  Body mass index is 24.24 kg/m. Patient must be at least 60 in tall to calculate ideal body weight   BMI Readings from Last 4 Encounters:  06/29/22 24.24 kg/m  06/22/22 24.04 kg/m  05/26/22 24.24 kg/m  04/29/22 24.24 kg/m   Wt Readings from Last 4 Encounters:  06/29/22 118 lb (53.5 kg)  06/22/22 117 lb (53.1 kg)  05/26/22 118 lb (53.5 kg)  04/29/22 118 lb (53.5 kg)    Psych/Mental status: Alert,  oriented x 3 (person, place, & time)       Eyes: PERLA Respiratory: No evidence of acute respiratory distress  Cervical Spine Area Exam  Skin & Axial Inspection: No masses, redness, edema, swelling, or associated skin lesions Alignment: Symmetrical Functional ROM: Unrestricted ROM      Stability: No instability detected Muscle Tone/Strength: Functionally intact. No obvious neuro-muscular anomalies detected. Sensory (Neurological): Unimpaired Palpation: No palpable anomalies             Upper Extremity (UE) Exam    Side: Right upper extremity  Side: Left upper extremity  Skin & Extremity Inspection: Skin color, temperature, and hair growth are WNL. No peripheral edema or cyanosis. No masses, redness, swelling, asymmetry, or associated skin lesions. No contractures.  Skin & Extremity Inspection: Skin color, temperature, and hair growth  are WNL. No peripheral edema or cyanosis. No masses, redness, swelling, asymmetry, or associated skin lesions. No contractures.  Functional ROM: Unrestricted ROM          Functional ROM: Pain restricted ROM for shoulder  Muscle Tone/Strength: Functionally intact. No obvious neuro-muscular anomalies detected.  Muscle Tone/Strength: Functionally intact. No obvious neuro-muscular anomalies detected.  Sensory (Neurological): Unimpaired          Sensory (Neurological): Arthropathic arthralgia and neurogenic pain pattern of left shoulder          Palpation: No palpable anomalies              Palpation: No palpable anomalies              Provocative Test(s):  Phalen's test: deferred Tinel's test: deferred Apley's scratch test (touch opposite shoulder):  Action 1 (Across chest): Adequate ROM Action 2 (Overhead): Adequate ROM Action 3 (LB reach): Adequate ROM   Provocative Test(s):  Phalen's test: deferred Tinel's test: deferred Apley's scratch test (touch opposite shoulder):  Action 1 (Across chest): Decreased ROM Action 2 (Overhead): Decreased ROM Action 3 (LB reach): Decreased ROM     Assessment  Primary Diagnosis & Pertinent Problem List: The primary encounter diagnosis was Suprascapular nerve entrapment, left. Diagnoses of Chronic left shoulder pain and Adhesive capsulitis of left shoulder were also pertinent to this visit.  Visit Diagnosis (New problems to examiner): 1. Suprascapular nerve entrapment, left   2. Chronic left shoulder pain   3. Adhesive capsulitis of left shoulder    Plan of Care (Initial workup plan)  Discussed left suprascapular nerve block for persistent and severe left shoulder pain related to adhesive capsulitis.  Risks and benefits reviewed.  Informed patient that if this is a positive diagnostic nerve block that we can consider left shoulder suprascapular RFA.  Patient in agreement with plan.  Encourage patient to continue with home stretching exercises that she  does although she has severely limited range of motion of her left shoulder.   Procedure Orders         SUPRASCAPULAR NERVE BLOCK         Provider-requested follow-up: Return in about 1 week (around 07/06/2022) for Left SSNB, in clinic NS.  No future appointments.  Duration of encounter: 77mnutes.  Total time on encounter, as per AMA guidelines included both the face-to-face and non-face-to-face time personally spent by the physician and/or other qualified health care professional(s) on the day of the encounter (includes time in activities that require the physician or other qualified health care professional and does not include time in activities normally performed by clinical staff). Physician's time may include the following activities when performed: Preparing to  see the patient (e.g., pre-charting review of records, searching for previously ordered imaging, lab work, and nerve conduction tests) Review of prior analgesic pharmacotherapies. Reviewing PMP Interpreting ordered tests (e.g., lab work, imaging, nerve conduction tests) Performing post-procedure evaluations, including interpretation of diagnostic procedures Obtaining and/or reviewing separately obtained history Performing a medically appropriate examination and/or evaluation Counseling and educating the patient/family/caregiver Ordering medications, tests, or procedures Referring and communicating with other health care professionals (when not separately reported) Documenting clinical information in the electronic or other health record Independently interpreting results (not separately reported) and communicating results to the patient/ family/caregiver Care coordination (not separately reported)  Note by: Gillis Santa, MD Date: 06/29/2022; Time: 1:36 PM

## 2022-06-29 NOTE — Progress Notes (Signed)
Safety precautions to be maintained throughout the outpatient stay will include: orient to surroundings, keep bed in low position, maintain call bell within reach at all times, provide assistance with transfer out of bed and ambulation.  

## 2022-06-29 NOTE — Patient Instructions (Signed)
____________________________________________________________________________________________  General Risks and Possible Complications  Patient Responsibilities: It is important that you read this as it is part of your informed consent. It is our duty to inform you of the risks and possible complications associated with treatments offered to you. It is your responsibility as a patient to read this and to ask questions about anything that is not clear or that you believe was not covered in this document.  Patient's Rights: You have the right to refuse treatment. You also have the right to change your mind, even after initially having agreed to have the treatment done. However, under this last option, if you wait until the last second to change your mind, you may be charged for the materials used up to that point.  Introduction: Medicine is not an Chief Strategy Officer. Everything in Medicine, including the lack of treatment(s), carries the potential for danger, harm, or loss (which is by definition: Risk). In Medicine, a complication is a secondary problem, condition, or disease that can aggravate an already existing one. All treatments carry the risk of possible complications. The fact that a side effects or complications occurs, does not imply that the treatment was conducted incorrectly. It must be clearly understood that these can happen even when everything is done following the highest safety standards.  No treatment: You can choose not to proceed with the proposed treatment alternative. The "PRO(s)" would include: avoiding the risk of complications associated with the therapy. The "CON(s)" would include: not getting any of the treatment benefits. These benefits fall under one of three categories: diagnostic; therapeutic; and/or palliative. Diagnostic benefits include: getting information which can ultimately lead to improvement of the disease or symptom(s). Therapeutic benefits are those associated with  the successful treatment of the disease. Finally, palliative benefits are those related to the decrease of the primary symptoms, without necessarily curing the condition (example: decreasing the pain from a flare-up of a chronic condition, such as incurable terminal cancer).  General Risks and Complications: These are associated to most interventional treatments. They can occur alone, or in combination. They fall under one of the following six (6) categories: no benefit or worsening of symptoms; bleeding; infection; nerve damage; allergic reactions; and/or death. No benefits or worsening of symptoms: In Medicine there are no guarantees, only probabilities. No healthcare provider can ever guarantee that a medical treatment will work, they can only state the probability that it may. Furthermore, there is always the possibility that the condition may worsen, either directly, or indirectly, as a consequence of the treatment. Bleeding: This is more common if the patient is taking a blood thinner, either prescription or over the counter (example: Goody Powders, Fish oil, Aspirin, Garlic, etc.), or if suffering a condition associated with impaired coagulation (example: Hemophilia, cirrhosis of the liver, low platelet counts, etc.). However, even if you do not have one on these, it can still happen. If you have any of these conditions, or take one of these drugs, make sure to notify your treating physician. Infection: This is more common in patients with a compromised immune system, either due to disease (example: diabetes, cancer, human immunodeficiency virus [HIV], etc.), or due to medications or treatments (example: therapies used to treat cancer and rheumatological diseases). However, even if you do not have one on these, it can still happen. If you have any of these conditions, or take one of these drugs, make sure to notify your treating physician. Nerve Damage: This is more common when the treatment is an  invasive one, but it can also happen with the use of medications, such as those used in the treatment of cancer. The damage can occur to small secondary nerves, or to large primary ones, such as those in the spinal cord and brain. This damage may be temporary or permanent and it may lead to impairments that can range from temporary numbness to permanent paralysis and/or brain death. Allergic Reactions: Any time a substance or material comes in contact with our body, there is the possibility of an allergic reaction. These can range from a mild skin rash (contact dermatitis) to a severe systemic reaction (anaphylactic reaction), which can result in death. Death: In general, any medical intervention can result in death, most of the time due to an unforeseen complication. ____________________________________________________________________________________________    ______________________________________________________________________  Preparing for your procedure  During your procedure appointment there will be: No Prescription Refills. No disability issues to discussed. No medication changes or discussions.  Instructions: Food intake: Avoid eating anything solid for at least 8 hours prior to your procedure. Clear liquid intake: You may take clear liquids such as water up to 2 hours prior to your procedure. (No carbonated drinks. No soda.) Transportation: Unless otherwise stated by your physician, bring a driver. Morning Medicines: Except for blood thinners, take all of your other morning medications with a sip of water. Make sure to take your heart and blood pressure medicines. If your blood pressure's lower number is above 100, the case will be rescheduled. Blood thinners: Make sure to stop your blood thinners as instructed.  If you take a blood thinner, but were not instructed to stop it, call our office [phone] and ask to talk to a nurse. Not stopping a blood thinner prior to certain  procedures could lead to serious complications. Diabetics on insulin: Notify the staff so that you can be scheduled 1st case in the morning. If your diabetes requires high dose insulin, take only  of your normal insulin dose the morning of the procedure and notify the staff that you have done so. Preventing infections: Shower with an antibacterial soap the morning of your procedure.  Build-up your immune system: Take 1000 mg of Vitamin C with every meal (3 times a day) the day prior to your procedure. Antibiotics: Inform the nursing staff if you are taking any antibiotics or if you have any conditions that may require antibiotics prior to procedures. (Example: recent joint implants)   Pregnancy: If you are pregnant make sure to notify the nursing staff. Not doing so may result in injury to the fetus, including death.  Sickness: If you have a cold, fever, or any active infections, call and cancel or reschedule your procedure. Receiving steroids while having an infection may result in complications. Arrival: You must be in the facility at least 30 minutes prior to your scheduled procedure. Tardiness: Your scheduled time is also the cutoff time. If you do not arrive at least 15 minutes prior to your procedure, you will be rescheduled.  Children: Do not bring any children with you. Make arrangements to keep them home. Dress appropriately: There is always a possibility that your clothing may get soiled. Avoid long dresses. Valuables: Do not bring any jewelry or valuables.  Reasons to call and reschedule or cancel your procedure: (Following these recommendations will minimize the risk of a serious complication.) Surgeries: Avoid having procedures within 2 weeks of any surgery. (Avoid for 2 weeks before or after any surgery). Flu Shots: Avoid having procedures within 2  weeks of a flu shots or . (Avoid for 2 weeks before or after immunizations). Barium: Avoid having a procedure within 7-10 days after having  had a radiological study involving the use of radiological contrast. (Myelograms, Barium swallow or enema study). Heart attacks: Avoid any elective procedures or surgeries for the initial 6 months after a "Myocardial Infarction" (Heart Attack). Blood thinners: It is imperative that you stop these medications before procedures. Let us know if you if you take any blood thinner.  Infection: Avoid procedures during or within two weeks of an infection (including chest colds or gastrointestinal problems). Symptoms associated with infections include: Localized redness, fever, chills, night sweats or profuse sweating, burning sensation when voiding, cough, congestion, stuffiness, runny nose, sore throat, diarrhea, nausea, vomiting, cold or Flu symptoms, recent or current infections. It is specially important if the infection is over the area that we intend to treat. Heart and lung problems: Symptoms that may suggest an active cardiopulmonary problem include: cough, chest pain, breathing difficulties or shortness of breath, dizziness, ankle swelling, uncontrolled high or unusually low blood pressure, and/or palpitations. If you are experiencing any of these symptoms, cancel your procedure and contact your primary care physician for an evaluation.  Remember:  Regular Business hours are:  Monday to Thursday 8:00 AM to 4:00 PM  Provider's Schedule: Milinda Pointer, MD:  Procedure days: Tuesday and Thursday 7:30 AM to 4:00 PM  Gillis Santa, MD:  Procedure days: Monday and Wednesday 7:30 AM to 4:00 PM  ______________________________________________________________________   Selective Nerve Root Block Patient Information  Description: Specific nerve roots exit the spinal canal and these nerves can be compressed and inflamed by a bulging disc and bone spurs.  By injecting steroids on the nerve root, we can potentially decrease the inflammation surrounding these nerves, which often leads to decreased pain.   Also, by injecting local anesthesia on the nerve root, this can provide [place] helpful information to give to your referring doctor if it decreases your pain.  Selective nerve root blocks can be done along the spine from the neck to the low back depending on the location of your pain.   After numbing the skin with local anesthesia, a small needle is passed to the nerve root and the position of the needle is verified using x-ray pictures.  After the needle is in correct position, we then deposit the medication.  You may experience a pressure sensation while this is being done.  The entire block usually lasts less than 15 minutes.  Conditions that may be treated with selective nerve root blocks: Low back and leg pain Spinal stenosis Diagnostic block prior to potential surgery Neck and arm pain Post laminectomy syndrome  Preparation for the injection:  Do not eat any solid food or dairy products within 8 hours of your appointment. You may drink clear liquids up to 3 hours before an appointment.  Clear liquids include water, black coffee, juice or soda.  No milk or cream please. You may take your regular medications, including pain medications, with a sip of water before your appointment.  Diabetics should hold regular insulin (if taken separately) and take 1/2 normal NPH dose the morning of the procedure.  Carry some sugar containing items with you to your appointment. A driver must accompany you and be prepared to drive you home after your procedure. Bring all your current medications with you. An IV may be inserted and sedation may be given at the discretion of the physician. A blood pressure cuff,  EKG, and other monitors will often be applied during the procedure.  Some patients may need to have extra oxygen administered for a short period. You will be asked to provide medical information, including allergies, prior to the procedure.  We must know immediately if you are taking blood  Thinners (like  Coumadin) or if you are allergic to IV iodine contrast (dye).  Possible side-effects: All are usually temporary Bleeding from needle site Light headedness Numbness and tingling Decreased blood pressure Weakness in arms/legs Pressure sensation in back/neck Pain at injection site (several days)  Possible complications: All are extremely rare Infection Nerve injury Spinal headache (a headache wore with upright position)  Call if you experience: Fever/chills associated with headache or increased back/neck pain Headache worsened by an upright position New onset weakness or numbness of an extremity below the injection site Hives or difficulty breathing (go to the emergency room) Inflammation or drainage at the injection site(s) Severe back/neck pain greater than usual New symptoms which are concerning to you  Please note:  Although the local anesthetic injected can often make your back or neck feel good for several hours after the injection the pain will likely return.  It takes 3-5 days for steroids to work on the nerve root. You may not notice any pain relief for at least one week.  If effective, we will often do a series of 3 injections spaced 3-6 weeks apart to maximally decrease your pain.    If you have any questions, please call [phone] [facility]

## 2022-07-12 ENCOUNTER — Ambulatory Visit
Payer: Medicare Other | Attending: Student in an Organized Health Care Education/Training Program | Admitting: Student in an Organized Health Care Education/Training Program

## 2022-07-12 ENCOUNTER — Encounter: Payer: Self-pay | Admitting: Student in an Organized Health Care Education/Training Program

## 2022-07-12 ENCOUNTER — Ambulatory Visit
Admission: RE | Admit: 2022-07-12 | Discharge: 2022-07-12 | Disposition: A | Discharge status: Home / Self Care | Payer: Medicare Other | Source: Ambulatory Visit | Attending: Student in an Organized Health Care Education/Training Program | Admitting: Student in an Organized Health Care Education/Training Program

## 2022-07-12 DIAGNOSIS — G8929 Other chronic pain: Secondary | ICD-10-CM

## 2022-07-12 DIAGNOSIS — M7502 Adhesive capsulitis of left shoulder: Secondary | ICD-10-CM | POA: Diagnosis not present

## 2022-07-12 DIAGNOSIS — G5682 Other specified mononeuropathies of left upper limb: Secondary | ICD-10-CM | POA: Diagnosis not present

## 2022-07-12 DIAGNOSIS — M25512 Pain in left shoulder: Secondary | ICD-10-CM | POA: Diagnosis not present

## 2022-07-12 MED ORDER — DEXAMETHASONE SODIUM PHOSPHATE 10 MG/ML IJ SOLN
INTRAMUSCULAR | Status: AC
  Filled 2022-07-12: qty 1

## 2022-07-12 MED ORDER — IOHEXOL 180 MG/ML  SOLN
10.0000 mL | Freq: Once | INTRAMUSCULAR | Status: AC
  Administered 2022-07-12: 10 mL via INTRA_ARTICULAR

## 2022-07-12 MED ORDER — LIDOCAINE HCL (PF) 2 % IJ SOLN
INTRAMUSCULAR | Status: AC
  Filled 2022-07-12: qty 10

## 2022-07-12 MED ORDER — ROPIVACAINE HCL 2 MG/ML IJ SOLN
INTRAMUSCULAR | Status: AC
  Filled 2022-07-12: qty 20

## 2022-07-12 MED ORDER — LIDOCAINE HCL 2 % IJ SOLN
20.0000 mL | Freq: Once | INTRAMUSCULAR | Status: AC
  Administered 2022-07-12: 200 mg

## 2022-07-12 MED ORDER — IOHEXOL 180 MG/ML  SOLN
INTRAMUSCULAR | Status: AC
  Filled 2022-07-12: qty 20

## 2022-07-12 MED ORDER — DEXAMETHASONE SODIUM PHOSPHATE 10 MG/ML IJ SOLN
10.0000 mg | Freq: Once | INTRAMUSCULAR | Status: AC
  Administered 2022-07-12: 10 mg

## 2022-07-12 MED ORDER — ROPIVACAINE HCL 2 MG/ML IJ SOLN
4.0000 mL | Freq: Once | INTRAMUSCULAR | Status: AC
  Administered 2022-07-12: 4 mL via INTRA_ARTICULAR

## 2022-07-12 NOTE — Progress Notes (Signed)
Safety precautions to be maintained throughout the outpatient stay will include: orient to surroundings, keep bed in low position, maintain call bell within reach at all times, provide assistance with transfer out of bed and ambulation.  

## 2022-07-12 NOTE — Patient Instructions (Signed)

## 2022-07-12 NOTE — Progress Notes (Signed)
PROVIDER NOTE: Interpretation of information contained herein should be left to medically-trained personnel. Specific patient instructions are provided elsewhere under "Patient Instructions" section of medical record. This document was created in part using STT-dictation technology, any transcriptional errors that may result from this process are unintentional.  Patient: Lisa Levine Type: Established DOB: 1975/06/14 MRN: [id number] PCP: Lisa Pai, PA-C  Service: Procedure DOS: 07/12/2022 Setting: Ambulatory Location: Ambulatory outpatient facility Delivery: Face-to-face Provider: Gillis Santa, MD Specialty: Interventional Pain Management Specialty designation: 09 Location: Outpatient facility Ref. Prov.: Lisa Santa, MD       Interventional Therapy   Procedure: Suprascapular nerve block (SSNB) #1  Laterality:  Left Level: Superior to scapular spine, lateral to supraspinatus fossa (Suprascapular notch).  Imaging: Fluoroscopic guidance         Anesthesia: Local anesthesia (1-2% Lidocaine) DOS: 07/12/2022  Performed by: Lisa Santa, MD  Purpose: Diagnostic/Therapeutic Indications: Shoulder pain, severe enough to impact quality of life and/or function. 1. Suprascapular nerve entrapment, left   2. Chronic left shoulder pain   3. Adhesive capsulitis of left shoulder    NAS-11 score:   Pre-procedure: 9 /10   Post-procedure: 0-No pain/10     Target: Suprascapular nerve Location: midway between the medial border of the scapula and the acromion as it runs through the suprascapular notch. Region: Suprascapular, posterior shoulder  Approach: Percutaneous  Neuroanatomy: The suprascapular nerve is the lateral branch of the superior trunk of the brachial plexus. It receives nerve fibers that originate in the nerve roots C5 and C6 (and sometimes C4). It is a mixed nerve, meaning that it provides both sensory and motor supply for the suprascapular region. Function: The main function of  this nerve is to provide motor innervation for two muscles, the supraspinatus and infraspinatus muscles. They are part of the rotator cuff muscles. In addition, the suprascapular nerve provides a sensory supply to the joints of the scapula (glenohumeral and acromioclavicular joints). Rationale (medical necessity): procedure needed and proper for the diagnosis and/or treatment of the patient's medical symptoms and needs.  Position / Prep / Materials:  Position: Prone Materials:  Tray: Block Needle(s):  Type: Spinal  Gauge (G): 22  Length: 3.5 in.  Qty: 1 Prep solution: DuraPrep (Iodine Povacrylex [0.7% available iodine] and Isopropyl Alcohol, 74% w/w) Prep Area: Entire posterior shoulder area. From upper spine to shoulder proper (upper arm), and from lateral neck to lower tip of shoulder blade.   Pre-op H&P Assessment:  [name] is a 47 y.o. (year old), female patient, seen today for interventional treatment. She  has a past surgical history that includes insulin pump (2005-present). [name] has a current medication list which includes the following prescription(s): acetaminophen, atorvastatin, cetirizine, chlorhexidine, diclofenac sodium, fluconazole, gabapentin, ibuprofen, insulin aspart, insulin pump, insulin nph human, levocetirizine, meloxicam, tramadol, triamcinolone ointment, vitamin d (ergocalciferol), and amitriptyline. Her primarily concern today is the Shoulder Pain  Initial Vital Signs:  Pulse/HCG Rate: 71ECG Heart Rate: 79 Temp: 98.1 F (36.7 C) Resp: 18 BP: 130/79 SpO2: 99 %  BMI: Estimated body mass index is 24.24 kg/m as calculated from the following:   Height as of this encounter: 4' 10.5" (1.486 m).   Weight as of this encounter: 118 lb (53.5 kg).  Risk Assessment: Allergies: Reviewed. She has No Known Allergies.  Allergy Precautions: None required Coagulopathies: Reviewed. None identified.  Blood-thinner therapy: None at this time Active Infection(s): Reviewed.  None identified. [name] is afebrile  Site Confirmation: [name] was asked to confirm the procedure  and laterality before marking the site Procedure checklist: Completed Consent: Before the procedure and under the influence of no sedative(s), amnesic(s), or anxiolytics, the patient was informed of the treatment options, risks and possible complications. To fulfill our ethical and legal obligations, as recommended by the American Medical Association's Code of Ethics, I have informed the patient of my clinical impression; the nature and purpose of the treatment or procedure; the risks, benefits, and possible complications of the intervention; the alternatives, including doing nothing; the risk(s) and benefit(s) of the alternative treatment(s) or procedure(s); and the risk(s) and benefit(s) of doing nothing. The patient was provided information about the general risks and possible complications associated with the procedure. These may include, but are not limited to: failure to achieve desired goals, infection, bleeding, organ or nerve damage, allergic reactions, paralysis, and death. In addition, the patient was informed of those risks and complications associated to the procedure, such as failure to decrease pain; infection; bleeding; organ or nerve damage with subsequent damage to sensory, motor, and/or autonomic systems, resulting in permanent pain, numbness, and/or weakness of one or several areas of the body; allergic reactions; (i.e.: anaphylactic reaction); and/or death. Furthermore, the patient was informed of those risks and complications associated with the medications. These include, but are not limited to: allergic reactions (i.e.: anaphylactic or anaphylactoid reaction(s)); adrenal axis suppression; blood sugar elevation that in diabetics may result in ketoacidosis or comma; water retention that in patients with history of congestive heart failure may result in shortness of breath, pulmonary edema,  and decompensation with resultant heart failure; weight gain; swelling or edema; medication-induced neural toxicity; particulate matter embolism and blood vessel occlusion with resultant organ, and/or nervous system infarction; and/or aseptic necrosis of one or more joints. Finally, the patient was informed that Medicine is not an exact science; therefore, there is also the possibility of unforeseen or unpredictable risks and/or possible complications that may result in a catastrophic outcome. The patient indicated having understood very clearly. We have given the patient no guarantees and we have made no promises. Enough time was given to the patient to ask questions, all of which were answered to the patient's satisfaction. Ms. Justin has indicated that she wanted to continue with the procedure. Attestation: I, the ordering provider, attest that I have discussed with the patient the benefits, risks, side-effects, alternatives, likelihood of achieving goals, and potential problems during recovery for the procedure that I have provided informed consent. Date  Time: 07/12/2022  1:12 PM  Pre-Procedure Preparation:  Monitoring: As per clinic protocol. Respiration, ETCO2, SpO2, BP, heart rate and rhythm monitor placed and checked for adequate function Safety Precautions: Patient was assessed for positional comfort and pressure points before starting the procedure. Time-out: I initiated and conducted the "Time-out" before starting the procedure, as per protocol. The patient was asked to participate by confirming the accuracy of the "Time Out" information. Verification of the correct person, site, and procedure were performed and confirmed by me, the nursing staff, and the patient. "Time-out" conducted as per Joint Commission's Universal Protocol (UP.01.01.01). Time: 1338  Description of Procedure:          Procedural Technique Safety Precautions: Aspiration looking for blood return was conducted prior to all  injections. At no point did we inject any substances, as a needle was being advanced. No attempts were made at seeking any paresthesias. Safe injection practices and needle disposal techniques used. Medications properly checked for expiration dates. SDV (single dose vial) medications used. Description of the Procedure:  Protocol guidelines were followed. The patient was placed in position over the procedure table. The target area was identified and the area prepped in the usual manner. Skin & deeper tissues infiltrated with local anesthetic. Appropriate amount of time allowed to pass for local anesthetics to take effect. The procedure needles were then advanced to the target area. Proper needle placement secured. Negative aspiration confirmed. Solution injected in intermittent fashion, asking for systemic symptoms every 0.5cc of injectate. The needles were then removed and the area cleansed, making sure to leave some of the prepping solution back to take advantage of its long term bactericidal properties.  5 cc solution made of 4 cc of 0.2% ropivacaine, 1 cc of Decadron 10 mg/cc, injected along left SSN after contrast    Vitals:   07/12/22 1316 07/12/22 1337 07/12/22 1342 07/12/22 1346  BP: 130/79 133/76  132/81  Pulse: 71     Resp: 18 18 17 15  $ Temp: 98.1 F (36.7 C)     TempSrc: Temporal     SpO2: 99% 100% 98% 100%  Weight: 118 lb (53.5 kg)     Height: 4' 10.5" (1.486 m)        Start Time: 1338 hrs. End Time: 1345 hrs.  Imaging Guidance (Spinal):          Type of Imaging Technique: Fluoroscopy Guidance (Spinal) Indication(s): Assistance in needle guidance and placement for procedures requiring needle placement in or near specific anatomical locations not easily accessible without such assistance. Exposure Time: Please see nurses notes. Contrast: None used. Fluoroscopic Guidance: I was personally present during the use of fluoroscopy. "Tunnel Vision Technique" used to obtain the best  possible view of the target area. Parallax error corrected before commencing the procedure. "Direction-depth-direction" technique used to introduce the needle under continuous pulsed fluoroscopy. Once target was reached, antero-posterior, oblique, and lateral fluoroscopic projection used confirm needle placement in all planes. Images permanently stored in EMR. Interpretation: No contrast injected. I personally interpreted the imaging intraoperatively. Adequate needle placement confirmed in multiple planes. Permanent images saved into the patient's record.  Antibiotic Prophylaxis:   Anti-infectives (From admission, onward)    None      Indication(s): None identified  Post-operative Assessment:  Post-procedure Vital Signs:  Pulse/HCG Rate: 7179 Temp: 98.1 F (36.7 C) Resp: 15 BP: 132/81 SpO2: 100 %  EBL: None  Complications: No immediate post-treatment complications observed by team, or reported by patient.  Note: The patient tolerated the entire procedure well. A repeat set of vitals were taken after the procedure and the patient was kept under observation following institutional policy, for this type of procedure. Post-procedural neurological assessment was performed, showing return to baseline, prior to discharge. The patient was provided with post-procedure discharge instructions, including a section on how to identify potential problems. Should any problems arise concerning this procedure, the patient was given instructions to immediately contact us, at any time, without hesitation. In any case, we plan to contact the patient by telephone for a follow-up status report regarding this interventional procedure.  Comments:  No additional relevant information.  Plan of Care (POC)  Orders:  Orders Placed This Encounter  Procedures   DG PAIN [facility] C-ARM 1-60 MIN NO REPORT    Intraoperative interpretation by procedural physician at [facility].    Standing Status:    Standing    Number of Occurrences:   1    Order Specific Question:   Reason for exam:    Answer:   Assistance in needle  guidance and placement for procedures requiring needle placement in or near specific anatomical locations not easily accessible without such assistance.     Medications ordered for procedure: Meds ordered this encounter  Medications   iohexol (OMNIPAQUE) 180 MG/ML injection 10 mL    Must be Myelogram-compatible. If not available, you may substitute with a water-soluble, non-ionic, hypoallergenic, myelogram-compatible radiological contrast medium.   lidocaine (XYLOCAINE) 2 % (with pres) injection 400 mg   dexamethasone (DECADRON) injection 10 mg   ropivacaine (PF) 2 mg/mL (0.2%) (NAROPIN) injection 4 mL   Medications administered: We administered iohexol, lidocaine, dexamethasone, and ropivacaine (PF) 2 mg/mL (0.2%).  See the medical record for exact dosing, route, and time of administration.  Follow-up plan:   Return in about 4 weeks (around 08/09/2022) for Post Procedure Evaluation, virtual.       Left SSNB 07/12/22    Recent Visits Date Type Provider Dept  06/29/22 Office Visit Lisa Santa, MD [facility]  Showing recent visits within past 90 days and meeting all other requirements Today's Visits Date Type Provider Dept  07/12/22 Procedure visit Lisa Santa, MD [facility]  Showing today's visits and meeting all other requirements Future Appointments Date Type Provider Dept  08/11/22 Appointment Lisa Santa, MD [facility]  Showing future appointments within next 90 days and meeting all other requirements  Disposition: Discharge home  Discharge (Date  Time): 07/12/2022; 1356 hrs.   Primary Care Physician: Lisa Pai, PA-C Location: [facility] Note by: Lisa Santa, MD (TTS technology used. I apologize for any typographical errors that were not detected and corrected.) Date:  07/12/2022; Time: 2:15 PM  Disclaimer:  Medicine is not an Chief Strategy Officer. The only guarantee in medicine is that nothing is guaranteed. It is important to note that the decision to proceed with this intervention was based on the information collected from the patient. The Data and conclusions were drawn from the patient's questionnaire, the interview, and the physical examination. Because the information was provided in large part by the patient, it cannot be guaranteed that it has not been purposely or unconsciously manipulated. Every effort has been made to obtain as much relevant data as possible for this evaluation. It is important to note that the conclusions that lead to this procedure are derived in large part from the available data. Always take into account that the treatment will also be dependent on availability of resources and existing treatment guidelines, considered by other Pain Management Practitioners as being common knowledge and practice, at the time of the intervention. For Medico-Legal purposes, it is also important to point out that variation in procedural techniques and pharmacological choices are the acceptable norm. The indications, contraindications, technique, and results of the above procedure should only be interpreted and judged by a Board-Certified Interventional Pain Specialist with extensive familiarity and expertise in the same exact procedure and technique.

## 2022-07-13 ENCOUNTER — Telehealth: Payer: Self-pay | Admitting: *Deleted

## 2022-07-13 NOTE — Telephone Encounter (Signed)
Attempted to call for post procedure follow-up. Message left. 

## 2022-07-16 ENCOUNTER — Other Ambulatory Visit: Payer: Self-pay | Admitting: Family Medicine

## 2022-07-16 DIAGNOSIS — M5412 Radiculopathy, cervical region: Secondary | ICD-10-CM

## 2022-07-16 NOTE — Progress Notes (Signed)
Ordered cervical epidural for exacerbation of left sided radicular symptoms.   Rosemarie Ax, MD [facility] 07/16/2022, 7:59 AM

## 2022-07-20 ENCOUNTER — Ambulatory Visit
Admission: RE | Admit: 2022-07-20 | Discharge: 2022-07-20 | Disposition: A | Discharge status: Home / Self Care | Payer: Medicare Other | Source: Ambulatory Visit | Attending: Family Medicine | Admitting: Family Medicine

## 2022-07-20 DIAGNOSIS — M5412 Radiculopathy, cervical region: Secondary | ICD-10-CM

## 2022-07-20 DIAGNOSIS — K080 Exfoliation of teeth due to systemic causes: Secondary | ICD-10-CM | POA: Diagnosis not present

## 2022-07-20 MED ORDER — IOPAMIDOL (ISOVUE-M 300) INJECTION 61%
1.0000 mL | Freq: Once | INTRAMUSCULAR | Status: AC
  Administered 2022-07-20: 1 mL via EPIDURAL

## 2022-07-20 MED ORDER — TRIAMCINOLONE ACETONIDE 40 MG/ML IJ SUSP (RADIOLOGY)
60.0000 mg | Freq: Once | INTRAMUSCULAR | Status: AC
  Administered 2022-07-20: 60 mg via EPIDURAL

## 2022-07-20 NOTE — Discharge Instructions (Signed)

## 2022-07-23 ENCOUNTER — Other Ambulatory Visit: Payer: Medicare Other

## 2022-07-31 DIAGNOSIS — E1042 Type 1 diabetes mellitus with diabetic polyneuropathy: Secondary | ICD-10-CM | POA: Diagnosis not present

## 2022-08-11 ENCOUNTER — Ambulatory Visit
Payer: Medicare Other | Attending: Student in an Organized Health Care Education/Training Program | Admitting: Student in an Organized Health Care Education/Training Program

## 2022-08-11 DIAGNOSIS — G8929 Other chronic pain: Secondary | ICD-10-CM

## 2022-08-11 DIAGNOSIS — G5682 Other specified mononeuropathies of left upper limb: Secondary | ICD-10-CM | POA: Diagnosis not present

## 2022-08-11 DIAGNOSIS — M7502 Adhesive capsulitis of left shoulder: Secondary | ICD-10-CM | POA: Diagnosis not present

## 2022-08-11 DIAGNOSIS — M25512 Pain in left shoulder: Secondary | ICD-10-CM | POA: Diagnosis not present

## 2022-08-11 NOTE — Progress Notes (Signed)
Patient: Lisa Levine  Service Category: E/M  Provider: Gillis Santa, MD  DOB: 25-Aug-1975  DOS: 08/11/2022  Location: Office  MRN: [id number]  Setting: Ambulatory outpatient  Referring Provider: Mackie Pai, PA-C  Type: Established Patient  Specialty: Interventional Pain Management  PCP: Mackie Pai, PA-C  Location: Remote location  Delivery: TeleHealth     Virtual Encounter - Pain Management PROVIDER NOTE: Information contained herein reflects review and annotations entered in association with encounter. Interpretation of such information and data should be left to medically-trained personnel. Information provided to patient can be located elsewhere in the medical record under "Patient Instructions". Document created using STT-dictation technology, any transcriptional errors that may result from process are unintentional.    Contact & Pharmacy Preferred: [phone] Home: [phone] (home) Mobile: [phone] (mobile) E-mail: [email or web address]  [facility] [phone] - [place], [place] - [address] AT [address] [place] [address] Phone: [phone] Fax: [facility] [phone] - [place], [place] - [address] AT [address] [place] [address] Phone: [phone] Fax: [phone]   Pre-screening  Lisa Levine offered "in-person" vs "virtual" encounter. She indicated preferring virtual for this encounter.   Reason COVID-19*  Social distancing based on CDC and AMA recommendations.   I contacted Lisa Levine on 08/11/2022 via telephone.      I clearly identified myself as Gillis Santa, MD. I verified that I was speaking with the correct person using two identifiers (Name: Lisa Levine, and date of birth: April 20, 1976).  Consent I sought verbal advanced consent from Lisa Levine for virtual visit interactions. I informed Lisa Levine of possible security and privacy concerns, risks,  and limitations associated with providing "not-in-person" medical evaluation and management services. I also informed Lisa Levine of the availability of "in-person" appointments. Finally, I informed her that there would be a charge for the virtual visit and that she could be  personally, fully or partially, financially responsible for it. Lisa Levine expressed understanding and agreed to proceed.   Historic Elements   Lisa Levine is a 47 y.o. year old, female patient evaluated today after our last contact on 07/12/2022. Lisa Levine  has a past medical history of Allergy, Arthritis, DVT (deep venous thrombosis) ([place]), Hyperlipidemia, Hypertension, Migraine, Peripheral neuropathy, Thyroid disease, Type 1 diabetes mellitus ([place]), and Vitamin D deficiency. She also  has a past surgical history that includes insulin pump (2005-present). Lisa Levine has a current medication list which includes the following prescription(s): acetaminophen, atorvastatin, cetirizine, chlorhexidine, diclofenac sodium, fluconazole, gabapentin, ibuprofen, insulin aspart, insulin pump, insulin nph human, levocetirizine, meloxicam, tramadol, triamcinolone ointment, vitamin d (ergocalciferol), and amitriptyline. She  reports that she has never smoked. She has never used smokeless tobacco. She reports that she does not drink alcohol and does not use drugs. Lisa Levine has No Known Allergies.  BMI: Estimated body mass index is 24.24 kg/m as calculated from the following:   Height as of 07/12/22: 4' 10.5" (1.486 m).   Weight as of 07/12/22: 118 lb (53.5 kg). Last encounter: 06/29/2022. Last procedure: 07/12/2022.  HPI  Today, she is being contacted for a post-procedure assessment.   Post-procedure evaluation   Procedure: Suprascapular nerve block (SSNB) #1  Laterality:  Left Level: Superior to scapular spine, lateral to supraspinatus fossa (Suprascapular notch).  Imaging: Fluoroscopic guidance         Anesthesia: Local  anesthesia (1-2% Lidocaine) DOS:  07/12/2022  Performed by: Gillis Santa, MD  Purpose: Diagnostic/Therapeutic Indications: Shoulder pain, severe enough to impact quality of life and/or function. 1. Suprascapular nerve entrapment, left   2. Chronic left shoulder pain   3. Adhesive capsulitis of left shoulder    NAS-11 score:   Pre-procedure: 9 /10   Post-procedure: 0-No pain/10      Effectiveness:  Initial hour after procedure: 100 %  Subsequent 4-6 hours post-procedure: 100 %  Analgesia past initial 6 hours: 80 % (current)  Ongoing improvement:  Analgesic:  80% Function: Somewhat improved ROM: Minimal improvement   Laboratory Chemistry Profile   Renal Lab Results  Component Value Date   BUN 11 12/15/2021   CREATININE 0.64 12/15/2021   BCR 15 10/23/2020   GFR 94.70 09/30/2021   GFRAA >60 10/25/2017   GFRNONAA >60 12/15/2021    Hepatic Lab Results  Component Value Date   AST 17 12/15/2021   ALT 18 12/15/2021   ALBUMIN 4.0 12/15/2021   ALKPHOS 49 12/15/2021   LIPASE 31 10/25/2017    Electrolytes Lab Results  Component Value Date   NA 135 12/15/2021   K 3.6 12/15/2021   CL 101 12/15/2021   CALCIUM 8.5 (L) 12/15/2021   MG 1.9 10/27/2016    Bone Lab Results  Component Value Date   VD25OH 18.54 (L) 09/30/2021   VD125OH2TOT 51 04/23/2021   [id number] 51 04/23/2021   [id number] <8 04/23/2021    Inflammation (CRP: Acute Phase) (ESR: Chronic Phase) Lab Results  Component Value Date   CRP 0.7 10/16/2015   ESRSEDRATE 13 10/16/2015         Note: Above Lab results reviewed.  Imaging  DG INJECT DIAG/THERA/INC NEEDLE/CATH/PLC EPI/CERV/THOR W/IMG CLINICAL DATA:  Left upper extremity radicular pain. Good relief from the initial injection but with recurrence of symptoms.  FLUOROSCOPY: Radiation Exposure Index (as provided by the fluoroscopic device): 10.20 mGy Kerma  PROCEDURE: CERVICAL EPIDURAL INJECTION  An interlaminar approach was performed on the left at C7-T1. A 20 gauge epidural  needle was advanced using loss-of-resistance technique.  DIAGNOSTIC EPIDURAL INJECTION  Injection of Isovue-M 300 shows a good epidural pattern with spread above and below the level of needle placement, primarily on the left. No vascular opacification is seen. THERAPEUTIC  EPIDURAL INJECTION  1.5 ml of Kenalog 40 mixed with 1 ml of 1% Lidocaine and 2 ml of normal saline were then instilled. The procedure was well-tolerated, and the patient was discharged thirty minutes following the injection in good condition.  IMPRESSION: Technically successful repeat epidural injection on the left at C7-T1.  Electronically Signed   By: Nelson Chimes M.D.   On: 07/20/2022 08:44  Assessment  The primary encounter diagnosis was Suprascapular nerve entrapment, left. Diagnoses of Chronic left shoulder pain and Adhesive capsulitis of left shoulder were also pertinent to this visit.  Plan of Care  1. Suprascapular nerve entrapment, left - Radiofrequency Shoulder Joint; Standing  2. Chronic left shoulder pain - Radiofrequency Shoulder Joint; Standing  3. Adhesive capsulitis of left shoulder - Radiofrequency Shoulder Joint; Standing  Excellent response to left suprascapular nerve block.  Patient endorses significant analgesic benefit however she still has limited range of motion.  Encouraged her to continue working with physical therapy.  We can consider a pulsed radiofrequency ablation of her left suprascapular nerve should her pain return.  I also informed the patient that we could perform a cervical epidural steroid injection here as well if she is having trouble  scheduling with radiology.  Patient was very appreciative and states that she will follow-up as needed.  Orders:  Orders Placed This Encounter  Procedures   Radiofrequency Shoulder Joint    For shoulder pain.    Standing Status:   Standing    Number of Occurrences:   2    Standing Expiration Date:   02/11/2023    Scheduling  Instructions:     Procedure: Suprascapular Nerve Radiofrequency Ablation     Laterality: LEFT     Level(s): Suprascapular nerve     Sedation: pts choice     Scheduling Timeframe: PRN    Order Specific Question:   Where will this procedure be performed?    Answer:   [facility]   Follow-up plan:   Return if symptoms worsen or fail to improve, for PRN suprascapular nerve RFA.      Left SSNB 07/12/22    Recent Visits Date Type Provider Dept  07/12/22 Procedure visit Gillis Santa, MD [facility]  06/29/22 Office Visit Gillis Santa, MD [facility]  Showing recent visits within past 90 days and meeting all other requirements Today's Visits Date Type Provider Dept  08/11/22 Office Visit Gillis Santa, MD [facility]  Showing today's visits and meeting all other requirements Future Appointments No visits were found meeting these conditions. Showing future appointments within next 90 days and meeting all other requirements  I discussed the assessment and treatment plan with the patient. The patient was provided an opportunity to ask questions and all were answered. The patient agreed with the plan and demonstrated an understanding of the instructions.  Patient advised to call back or seek an in-person evaluation if the symptoms or condition worsens.  Duration of encounter: 69minutes.  Note by: Gillis Santa, MD Date: 08/11/2022; Time: 4:53 PM

## 2022-08-30 DIAGNOSIS — E1042 Type 1 diabetes mellitus with diabetic polyneuropathy: Secondary | ICD-10-CM | POA: Diagnosis not present

## 2022-09-06 ENCOUNTER — Encounter: Payer: Self-pay | Admitting: *Deleted

## 2022-09-29 DIAGNOSIS — E1042 Type 1 diabetes mellitus with diabetic polyneuropathy: Secondary | ICD-10-CM | POA: Diagnosis not present

## 2022-10-06 ENCOUNTER — Telehealth: Payer: Self-pay

## 2022-10-06 DIAGNOSIS — K080 Exfoliation of teeth due to systemic causes: Secondary | ICD-10-CM | POA: Diagnosis not present

## 2022-10-06 NOTE — Telephone Encounter (Signed)
Dr Quincy Simmonds said this was OK

## 2022-10-06 NOTE — Telephone Encounter (Signed)
The patient called and said she wants to proceed with the RFA instead of coming in for a visit. You have a PRN order, so I will go ahead and try to get prior authorization.

## 2022-10-06 NOTE — Telephone Encounter (Signed)
I will ok this wit Dr. Cherylann Ratel.

## 2022-10-14 ENCOUNTER — Ambulatory Visit: Payer: Medicare Other | Admitting: Student in an Organized Health Care Education/Training Program

## 2022-10-15 DIAGNOSIS — D485 Neoplasm of uncertain behavior of skin: Secondary | ICD-10-CM | POA: Diagnosis not present

## 2022-10-25 ENCOUNTER — Ambulatory Visit: Payer: Medicare Other | Admitting: Student in an Organized Health Care Education/Training Program

## 2022-10-27 ENCOUNTER — Ambulatory Visit
Payer: Medicare Other | Attending: Student in an Organized Health Care Education/Training Program | Admitting: Student in an Organized Health Care Education/Training Program

## 2022-10-27 ENCOUNTER — Encounter: Payer: Self-pay | Admitting: Student in an Organized Health Care Education/Training Program

## 2022-10-27 ENCOUNTER — Ambulatory Visit: Payer: Medicare Other | Admitting: Student in an Organized Health Care Education/Training Program

## 2022-10-27 ENCOUNTER — Ambulatory Visit
Admission: RE | Admit: 2022-10-27 | Discharge: 2022-10-27 | Disposition: A | Discharge status: Home / Self Care | Payer: Medicare Other | Source: Ambulatory Visit | Attending: Student in an Organized Health Care Education/Training Program | Admitting: Student in an Organized Health Care Education/Training Program

## 2022-10-27 VITALS — BP 118/78 | HR 71 | Temp 97.4°F | Resp 16 | Ht <= 58 in | Wt 118.0 lb

## 2022-10-27 DIAGNOSIS — G8929 Other chronic pain: Secondary | ICD-10-CM | POA: Insufficient documentation

## 2022-10-27 DIAGNOSIS — M7502 Adhesive capsulitis of left shoulder: Secondary | ICD-10-CM | POA: Diagnosis not present

## 2022-10-27 DIAGNOSIS — G5682 Other specified mononeuropathies of left upper limb: Secondary | ICD-10-CM | POA: Diagnosis not present

## 2022-10-27 DIAGNOSIS — M25512 Pain in left shoulder: Secondary | ICD-10-CM | POA: Diagnosis not present

## 2022-10-27 MED ORDER — ROPIVACAINE HCL 2 MG/ML IJ SOLN
INTRAMUSCULAR | Status: AC
  Filled 2022-10-27: qty 20

## 2022-10-27 MED ORDER — DEXAMETHASONE SODIUM PHOSPHATE 10 MG/ML IJ SOLN
10.0000 mg | Freq: Once | INTRAMUSCULAR | Status: AC
  Administered 2022-10-27: 10 mg

## 2022-10-27 MED ORDER — DEXAMETHASONE SODIUM PHOSPHATE 10 MG/ML IJ SOLN
INTRAMUSCULAR | Status: AC
  Filled 2022-10-27: qty 1

## 2022-10-27 MED ORDER — LIDOCAINE HCL 2 % IJ SOLN
20.0000 mL | Freq: Once | INTRAMUSCULAR | Status: AC
  Administered 2022-10-27: 400 mg

## 2022-10-27 MED ORDER — MIDAZOLAM HCL 2 MG/2ML IJ SOLN
INTRAMUSCULAR | Status: AC
  Filled 2022-10-27: qty 2

## 2022-10-27 MED ORDER — LIDOCAINE HCL 2 % IJ SOLN
INTRAMUSCULAR | Status: AC
  Filled 2022-10-27: qty 20

## 2022-10-27 MED ORDER — ROPIVACAINE HCL 2 MG/ML IJ SOLN
9.0000 mL | Freq: Once | INTRAMUSCULAR | Status: AC
  Administered 2022-10-27: 9 mL via PERINEURAL

## 2022-10-27 NOTE — Patient Instructions (Signed)

## 2022-10-27 NOTE — Progress Notes (Signed)
Safety precautions to be maintained throughout the outpatient stay will include: orient to surroundings, keep bed in low position, maintain call bell within reach at all times, provide assistance with transfer out of bed and ambulation.  

## 2022-10-27 NOTE — Progress Notes (Signed)
PROVIDER NOTE: Interpretation of information contained herein should be left to medically-trained personnel. Specific patient instructions are provided elsewhere under "Patient Instructions" section of medical record. This document was created in part using STT-dictation technology, any transcriptional errors that may result from this process are unintentional.  Patient: Lisa Levine Type: Established DOB: November 17, 1975 MRN: [id number] PCP: Esperanza Richters, PA-C  Service: Procedure DOS: 10/27/2022 Setting: Ambulatory Location: Ambulatory outpatient facility Delivery: Face-to-face Provider: Edward Jolly, MD Specialty: Interventional Pain Management Specialty designation: 09 Location: Outpatient facility Ref. Prov.: Saguier, Ramon Dredge, PA-C       Interventional Therapy   Procedure: Suprascapular nerve pulsed Radiofrequency Ablation (RFA) #1  Laterality: Left  Level: Superior to scapular spine, lateral to supraspinatus fossa (Suprascapular notch).  Imaging: Fluoroscopic guidance         Anesthesia: Local anesthesia (1-2% Lidocaine) DOS: 10/27/2022  Performed by: Edward Jolly, MD  Purpose: Therapeutic Indications: Shoulder pain severe enough to impact quality of life or function. Indications: 1. Suprascapular nerve entrapment, left   2. Chronic left shoulder pain   3. Adhesive capsulitis of left shoulder    Lisa Levine has been dealing with the above chronic pain for longer than three months and has either failed to respond, was unable to tolerate, or simply did not get enough benefit from other more conservative therapies including, but not limited to: 1. Over-the-counter medications 2. Anti-inflammatory medications 3. Muscle relaxants 4. Membrane stabilizers 5. Opioids 6. Physical therapy and/or chiropractic manipulation 7. Modalities (Heat, ice, etc.) 8. Invasive techniques such as nerve blocks. Lisa Levine has attained more than 50% relief of the pain from a series of diagnostic injections  conducted in separate occasions.  Pain Score: Pre-procedure: 8 /10 Post-procedure: 5 /10     Position / Prep / Materials:  Position: Prone  Prep solution: DuraPrep (Iodine Povacrylex [0.7% available iodine] and Isopropyl Alcohol, 74% w/w) Prep Area: Entire shoulder area.  This includes the lateral and posterior aspect of the neck, lateral and posterior aspect of the upper third of the upper arm, from the axilla to the spine in the upper back, down to the lower border of the scapula. Materials:  Tray:  RFA (Radiofrequency) tray Needle(s):  Type: RFA (Teflon-coated radiofrequency ablation needles)  Gauge (G): 22  Length: Short (5cm) Qty: 1  Pre-op H&P Assessment:  Lisa Levine is a 47 y.o. (year old), female patient, seen today for interventional treatment. She  has a past surgical history that includes insulin pump (2005-present). Lisa Levine has a current medication list which includes the following prescription(s): acetaminophen, atorvastatin, cetirizine, chlorhexidine, diclofenac sodium, fluconazole, gabapentin, ibuprofen, insulin aspart, insulin pump, insulin nph human, levocetirizine, meloxicam, tramadol, triamcinolone ointment, vitamin d (ergocalciferol), and amitriptyline. Her primarily concern today is the Arm Pain (left)  Initial Vital Signs:  Pulse/HCG Rate: 64  Temp: (!) 97.4 F (36.3 C) Resp: 16 BP: 101/64 SpO2: 100 %  BMI: Estimated body mass index is 24.66 kg/m as calculated from the following:   Height as of this encounter: 4\' 10"  (1.473 m).   Weight as of this encounter: 118 lb (53.5 kg).  Risk Assessment: Allergies: Reviewed. She has No Known Allergies.  Allergy Precautions: None required Coagulopathies: Reviewed. None identified.  Blood-thinner therapy: None at this time Active Infection(s): Reviewed. None identified. Lisa Levine is afebrile  Site Confirmation: Lisa Levine was asked to confirm the procedure and laterality before marking the site Procedure checklist:  Completed Consent: Before the procedure and under the influence of no sedative(s), amnesic(s), or anxiolytics, the  patient was informed of the treatment options, risks and possible complications. To fulfill our ethical and legal obligations, as recommended by the American Medical Association's Code of Ethics, I have informed the patient of my clinical impression; the nature and purpose of the treatment or procedure; the risks, benefits, and possible complications of the intervention; the alternatives, including doing nothing; the risk(s) and benefit(s) of the alternative treatment(s) or procedure(s); and the risk(s) and benefit(s) of doing nothing. The patient was provided information about the general risks and possible complications associated with the procedure. These may include, but are not limited to: failure to achieve desired goals, infection, bleeding, organ or nerve damage, allergic reactions, paralysis, and death. In addition, the patient was informed of those risks and complications associated to the procedure, such as failure to decrease pain; infection; bleeding; organ or nerve damage with subsequent damage to sensory, motor, and/or autonomic systems, resulting in permanent pain, numbness, and/or weakness of one or several areas of the body; allergic reactions; (i.e.: anaphylactic reaction); and/or death. Furthermore, the patient was informed of those risks and complications associated with the medications. These include, but are not limited to: allergic reactions (i.e.: anaphylactic or anaphylactoid reaction(s)); adrenal axis suppression; blood sugar elevation that in diabetics may result in ketoacidosis or comma; water retention that in patients with history of congestive heart failure may result in shortness of breath, pulmonary edema, and decompensation with resultant heart failure; weight gain; swelling or edema; medication-induced neural toxicity; particulate matter embolism and blood vessel  occlusion with resultant organ, and/or nervous system infarction; and/or aseptic necrosis of one or more joints. Finally, the patient was informed that Medicine is not an exact science; therefore, there is also the possibility of unforeseen or unpredictable risks and/or possible complications that may result in a catastrophic outcome. The patient indicated having understood very clearly. We have given the patient no guarantees and we have made no promises. Enough time was given to the patient to ask questions, all of which were answered to the patient's satisfaction. Ms. Waltman has indicated that she wanted to continue with the procedure. Attestation: I, the ordering provider, attest that I have discussed with the patient the benefits, risks, side-effects, alternatives, likelihood of achieving goals, and potential problems during recovery for the procedure that I have provided informed consent. Date  Time: 10/27/2022  7:43 AM  Pre-Procedure Preparation:  Monitoring: As per clinic protocol. Respiration, ETCO2, SpO2, BP, heart rate and rhythm monitor placed and checked for adequate function Safety Precautions: Patient was assessed for positional comfort and pressure points before starting the procedure. Time-out: I initiated and conducted the "Time-out" before starting the procedure, as per protocol. The patient was asked to participate by confirming the accuracy of the "Time Out" information. Verification of the correct person, site, and procedure were performed and confirmed by me, the nursing staff, and the patient. "Time-out" conducted as per Joint Commission's Universal Protocol (UP.01.01.01). Time: 0819 Start Time: 0819 hrs.  Description/Narrative of Procedure:          Target: Suprascapular nerve as it passes thru the lower portion of the suprascapular notch. Location: Suprascapular notch Region: Shoulder, suprascapular Approach: Percutaneous   Rationale (medical necessity): procedure needed and  proper for the diagnosis and/or treatment of the patient's medical symptoms and needs. Procedural Technique Safety Precautions: Aspiration looking for blood return was conducted prior to all injections. At no point did we inject any substances, as a needle was being advanced. No attempts were made at seeking any paresthesias. Safe  injection practices and needle disposal techniques used. Medications properly checked for expiration dates. SDV (single dose vial) medications used. Description of the Procedure: Protocol guidelines were followed. The patient was placed in position over the procedure table. The target area was identified and the area prepped in the usual manner. The skin and muscle were infiltrated with local anesthetic. Appropriate amount of time allowed to pass for local anesthetics to take effect. Radiofrequency needles were introduced to the target area using fluoroscopic guidance. Using the  [employer]  Radiofrequency Generator, sensory stimulation using 50 Hz was used to locate & identify the nerve, making sure that the needle was positioned such that there was no sensory stimulation below 0.3 V or above 0.7 V. Stimulation using 2 Hz was used to evaluate the motor component. Care was taken not to lesion any nerves that demonstrated motor stimulation of the lower extremities at an output of less than 2.5 times that of the sensory threshold, or a maximum of 2.0 V. Once satisfactory placement of the needles was achieved, the numbing solution was slowly injected after negative aspiration. After waiting for at least 2 minutes, the ablation was performed at 42 degrees C for 120 seconds, using Pulsed Radiofrequency settings. Once the procedure was completed, the needles were then removed and the area cleansed, making sure to leave some of the prepping solution back to take advantage of its long term bactericidal properties. Intra-operative Compliance: Compliant   Vitals:   10/27/22 0749 10/27/22 0812  10/27/22 0820 10/27/22 0830  BP: 101/64 125/74 120/80 118/78  Pulse: 64 67 69 71  Resp:  16 16 16   Temp: (!) 97.4 F (36.3 C)     TempSrc: Temporal     SpO2: 100% 98% 100% 100%  Weight: 118 lb (53.5 kg)     Height: 4\' 10"  (1.473 m)       Start Time: 0819 hrs. End Time: 0827 hrs.  Materials & Medications:  Needle(s) Type: Teflon-coated, curved tip, Radiofrequency needle(s) Gauge: 22G Length: 10cm Medication(s): Please see orders for medications and dosing details. 5 cc solution made of 4 cc of 0.2% ropivacaine, 1 cc of Decadron 10 mg/cc. Injected after sensory-motor testing, prior to lesioning   Imaging Guidance (Non-Spinal):          Type of Imaging Technique: Fluoroscopy Guidance (Non-Spinal) Indication(s): Assistance in needle guidance and placement for procedures requiring needle placement in or near specific anatomical locations not easily accessible without such assistance. Exposure Time: Please see nurses notes. Contrast: None used. Fluoroscopic Guidance: I was personally present during the use of fluoroscopy. "Tunnel Vision Technique" used to obtain the best possible view of the target area. Parallax error corrected before commencing the procedure. "Direction-depth-direction" technique used to introduce the needle under continuous pulsed fluoroscopy. Once target was reached, antero-posterior, oblique, and lateral fluoroscopic projection used confirm needle placement in all planes. Images permanently stored in EMR. Interpretation: No contrast injected.  Antibiotic Prophylaxis:   Anti-infectives (From admission, onward)    None      Indication(s): None identified  Post-operative Assessment:  Post-procedure Vital Signs:  Pulse/HCG Rate: 71  Temp: (!) 97.4 F (36.3 C) Resp: 16 BP: 118/78 SpO2: 100 %  EBL: None  Complications: No immediate post-treatment complications observed by team, or reported by patient.  Note: The patient tolerated the entire procedure  well. A repeat set of vitals were taken after the procedure and the patient was kept under observation following institutional policy, for this type of procedure. Post-procedural neurological assessment was  performed, showing return to baseline, prior to discharge. The patient was provided with post-procedure discharge instructions, including a section on how to identify potential problems. Should any problems arise concerning this procedure, the patient was given instructions to immediately contact us, at any time, without hesitation. In any case, we plan to contact the patient by telephone for a follow-up status report regarding this interventional procedure.  Comments:  No additional relevant information.  Plan of Care (POC)  Orders:  Orders Placed This Encounter  Procedures   DG PAIN CLINIC C-ARM 1-60 MIN NO REPORT    Intraoperative interpretation by procedural physician at [facility].    Standing Status:   Standing    Number of Occurrences:   1    Order Specific Question:   Reason for exam:    Answer:   Assistance in needle guidance and placement for procedures requiring needle placement in or near specific anatomical locations not easily accessible without such assistance.    Medications ordered for procedure: Meds ordered this encounter  Medications   lidocaine (XYLOCAINE) 2 % (with pres) injection 400 mg   dexamethasone (DECADRON) injection 10 mg   ropivacaine (PF) 2 mg/mL (0.2%) (NAROPIN) injection 9 mL   Medications administered: We administered lidocaine, dexamethasone, and ropivacaine (PF) 2 mg/mL (0.2%).  See the medical record for exact dosing, route, and time of administration.  Follow-up plan:   Return in about 6 weeks (around 12/08/2022) for Post Procedure Evaluation, virtual.       Left [facility] 07/12/22, pRFA 10/27/22     Recent Visits Date Type Provider Dept  08/11/22 Office Visit Edward Jolly, MD [facility]  Showing recent visits within past  90 days and meeting all other requirements Today's Visits Date Type Provider Dept  10/27/22 Procedure visit Edward Jolly, MD [facility]  Showing today's visits and meeting all other requirements Future Appointments Date Type Provider Dept  12/08/22 Appointment Edward Jolly, MD [facility]  Showing future appointments within next 90 days and meeting all other requirements  Disposition: Discharge home  Discharge (Date  Time): 10/27/2022; 0832 hrs.   Primary Care Physician: Esperanza Richters, PA-C Location: [facility] Note by: Edward Jolly, MD (TTS technology used. I apologize for any typographical errors that were not detected and corrected.) Date: 10/27/2022; Time: 8:43 AM  Disclaimer:  Medicine is not an Visual merchandiser. The only guarantee in medicine is that nothing is guaranteed. It is important to note that the decision to proceed with this intervention was based on the information collected from the patient. The Data and conclusions were drawn from the patient's questionnaire, the interview, and the physical examination. Because the information was provided in large part by the patient, it cannot be guaranteed that it has not been purposely or unconsciously manipulated. Every effort has been made to obtain as much relevant data as possible for this evaluation. It is important to note that the conclusions that lead to this procedure are derived in large part from the available data. Always take into account that the treatment will also be dependent on availability of resources and existing treatment guidelines, considered by other Pain Management Practitioners as being common knowledge and practice, at the time of the intervention. For Medico-Legal purposes, it is also important to point out that variation in procedural techniques and pharmacological choices are the acceptable norm. The indications, contraindications, technique, and results of the  above procedure should only be interpreted and judged by a Board-Certified Interventional  Pain Specialist with extensive familiarity and expertise in the same exact procedure and technique.

## 2022-10-28 ENCOUNTER — Telehealth: Payer: Self-pay | Admitting: *Deleted

## 2022-10-28 NOTE — Telephone Encounter (Signed)
No problems post procedure. 

## 2022-11-09 DIAGNOSIS — K080 Exfoliation of teeth due to systemic causes: Secondary | ICD-10-CM | POA: Diagnosis not present

## 2022-11-11 DIAGNOSIS — N6320 Unspecified lump in the left breast, unspecified quadrant: Secondary | ICD-10-CM | POA: Diagnosis not present

## 2022-11-11 DIAGNOSIS — N6310 Unspecified lump in the right breast, unspecified quadrant: Secondary | ICD-10-CM | POA: Diagnosis not present

## 2022-12-07 ENCOUNTER — Telehealth: Payer: Self-pay | Admitting: *Deleted

## 2022-12-07 NOTE — Telephone Encounter (Signed)
Attempted to call for pre appt questions. "Call cannot be completed as dialed."

## 2022-12-08 ENCOUNTER — Telehealth: Payer: Self-pay

## 2022-12-08 ENCOUNTER — Ambulatory Visit
Payer: Medicare Other | Attending: Student in an Organized Health Care Education/Training Program | Admitting: Student in an Organized Health Care Education/Training Program

## 2022-12-08 DIAGNOSIS — M25512 Pain in left shoulder: Secondary | ICD-10-CM

## 2022-12-08 DIAGNOSIS — G5682 Other specified mononeuropathies of left upper limb: Secondary | ICD-10-CM

## 2022-12-08 DIAGNOSIS — G8929 Other chronic pain: Secondary | ICD-10-CM

## 2022-12-08 NOTE — Telephone Encounter (Signed)
Attempted to call patient and call could not be completed as dialed.

## 2022-12-08 NOTE — Progress Notes (Signed)
 I attempted to call the patient however no response. Voicemail left instructing patient to call front desk office at [phone] to reschedule appointment. -Dr Lateef  

## 2022-12-08 NOTE — Telephone Encounter (Signed)
 Attempted to call patient, no answer 

## 2023-01-08 DIAGNOSIS — R059 Cough, unspecified: Secondary | ICD-10-CM | POA: Diagnosis not present

## 2023-01-08 DIAGNOSIS — M545 Low back pain, unspecified: Secondary | ICD-10-CM | POA: Diagnosis not present

## 2023-01-08 DIAGNOSIS — N200 Calculus of kidney: Secondary | ICD-10-CM | POA: Diagnosis not present

## 2023-01-08 DIAGNOSIS — M47816 Spondylosis without myelopathy or radiculopathy, lumbar region: Secondary | ICD-10-CM | POA: Diagnosis not present

## 2023-01-08 DIAGNOSIS — R509 Fever, unspecified: Secondary | ICD-10-CM | POA: Diagnosis not present

## 2023-01-08 DIAGNOSIS — N83202 Unspecified ovarian cyst, left side: Secondary | ICD-10-CM | POA: Diagnosis not present

## 2023-01-08 DIAGNOSIS — K429 Umbilical hernia without obstruction or gangrene: Secondary | ICD-10-CM | POA: Diagnosis not present

## 2023-01-08 DIAGNOSIS — R109 Unspecified abdominal pain: Secondary | ICD-10-CM | POA: Diagnosis not present

## 2023-01-08 DIAGNOSIS — M5442 Lumbago with sciatica, left side: Secondary | ICD-10-CM | POA: Diagnosis not present

## 2023-01-08 DIAGNOSIS — G629 Polyneuropathy, unspecified: Secondary | ICD-10-CM | POA: Diagnosis not present

## 2023-01-08 DIAGNOSIS — K5900 Constipation, unspecified: Secondary | ICD-10-CM | POA: Diagnosis not present

## 2023-01-08 DIAGNOSIS — Z975 Presence of (intrauterine) contraceptive device: Secondary | ICD-10-CM | POA: Diagnosis not present

## 2023-01-08 DIAGNOSIS — R202 Paresthesia of skin: Secondary | ICD-10-CM | POA: Diagnosis not present

## 2023-01-11 ENCOUNTER — Ambulatory Visit: Payer: Medicare Other | Admitting: Family

## 2023-01-17 DIAGNOSIS — K080 Exfoliation of teeth due to systemic causes: Secondary | ICD-10-CM | POA: Diagnosis not present

## 2023-01-18 ENCOUNTER — Ambulatory Visit (INDEPENDENT_AMBULATORY_CARE_PROVIDER_SITE_OTHER): Payer: Medicare Other | Admitting: Medical

## 2023-01-18 VITALS — BP 118/74 | HR 79 | Resp 18 | Ht <= 58 in | Wt 113.6 lb

## 2023-01-18 DIAGNOSIS — E559 Vitamin D deficiency, unspecified: Secondary | ICD-10-CM

## 2023-01-18 DIAGNOSIS — M5442 Lumbago with sciatica, left side: Secondary | ICD-10-CM

## 2023-01-18 DIAGNOSIS — R5383 Other fatigue: Secondary | ICD-10-CM

## 2023-01-18 LAB — CBC WITH DIFFERENTIAL/PLATELET
Basophils Absolute: 0 10*3/uL (ref 0.0–0.1)
Basophils Relative: 0.5 % (ref 0.0–3.0)
Eosinophils Absolute: 0.1 10*3/uL (ref 0.0–0.7)
Eosinophils Relative: 1.1 % (ref 0.0–5.0)
HCT: 41.3 % (ref 36.0–46.0)
Hemoglobin: 13.4 g/dL (ref 12.0–15.0)
Lymphocytes Relative: 19.1 % (ref 12.0–46.0)
Lymphs Abs: 1 10*3/uL (ref 0.7–4.0)
MCHC: 32.5 g/dL (ref 30.0–36.0)
MCV: 95.1 fl (ref 78.0–100.0)
Monocytes Absolute: 0.4 10*3/uL (ref 0.1–1.0)
Monocytes Relative: 8.5 % (ref 3.0–12.0)
Neutro Abs: 3.6 10*3/uL (ref 1.4–7.7)
Neutrophils Relative %: 70.8 % (ref 43.0–77.0)
Platelets: 397 10*3/uL (ref 150.0–400.0)
RBC: 4.34 Mil/uL (ref 3.87–5.11)
RDW: 12.9 % (ref 11.5–15.5)
WBC: 5.1 10*3/uL (ref 4.0–10.5)

## 2023-01-18 LAB — TSH: TSH: 1.83 u[IU]/mL (ref 0.35–5.50)

## 2023-01-18 LAB — VITAMIN D 25 HYDROXY (VIT D DEFICIENCY, FRACTURES): VITD: 14.11 ng/mL — ABNORMAL LOW (ref 30.00–100.00)

## 2023-01-18 LAB — T4, FREE: Free T4: 0.88 ng/dL (ref 0.60–1.60)

## 2023-01-18 MED ORDER — METHYLPREDNISOLONE 4 MG PO TABS: ORAL_TABLET | ORAL | 0 refills | Status: AC

## 2023-01-18 NOTE — Patient Instructions (Addendum)
Sciatica Persistent left-sided lower back pain radiating to the buttock and hamstring, exacerbated by sitting and standing. Recent increase in weakness and numbness in the left leg, likely exacerbated by underlying diabetic neuropathy. No saddle anesthesia. -Continue gabapentin 600mg  TID for neuropathy. -Continue Robaxin 500mg  BID, but reduce to once at night due to excessive sleepiness. -Start a 3-day taper of low-dose Medrol (4mg  tablets:3 tablets on day 1, 2 tablets on day 2, 1 tablet on day 3). -after medrol start meloxicam 15 mg daily(stop other ibuprofen) -Apply Solanpas lidocaine patch to the left SI area. -Continue stretching exercises. -Consider physical therapy if no improvement. -Consider MRI if severe pain, extreme leg weakness, or extreme numbness develops. --Consider tramadol for resistant pain or neuropathy.  Skin Infection Resolved ingrown hair in the groin area treated with amoxicillin. -No further action required unless symptoms recur.  Diabetes Mellitus Well-controlled with insulin pump. Baseline neuropathy. -Continue current management.  Fatigue Patient reports excessive tiredness, possibly related to Robaxin or low vitamin D levels. -Check vitamin D levels today. -Consider tramadol for resistant pain or neuropathy.   Follow up in 10 days or sooner if needed   Back Exercises These exercises help to make your trunk and back strong. They also help to keep the lower back flexible. Doing these exercises can help to prevent or lessen pain in your lower back. If you have back pain, try to do these exercises 2-3 times each day or as told by your doctor. As you get better, do the exercises once each day. Repeat the exercises more often as told by your doctor. To stop back pain from coming back, do the exercises once each day, or as told by your doctor. Do exercises exactly as told by your doctor. Stop right away if you feel sudden pain or your pain gets  worse. Exercises Single knee to chest Do these steps 3-5 times in a row for each leg: Lie on your back on a firm bed or the floor with your legs stretched out. Bring one knee to your chest. Grab your knee or thigh with both hands and hold it in place. Pull on your knee until you feel a gentle stretch in your lower back or butt. Keep doing the stretch for 10-30 seconds. Slowly let go of your leg and straighten it. Pelvic tilt Do these steps 5-10 times in a row: Lie on your back on a firm bed or the floor with your legs stretched out. Bend your knees so they point up to the ceiling. Your feet should be flat on the floor. Tighten your lower belly (abdomen) muscles to press your lower back against the floor. This will make your tailbone point up to the ceiling instead of pointing down to your feet or the floor. Stay in this position for 5-10 seconds while you gently tighten your muscles and breathe evenly. Cat-cow Do these steps until your lower back bends more easily: Get on your hands and knees on a firm bed or the floor. Keep your hands under your shoulders, and keep your knees under your hips. You may put padding under your knees. Let your head hang down toward your chest. Tighten (contract) the muscles in your belly. Point your tailbone toward the floor so your lower back becomes rounded like the back of a cat. Stay in this position for 5 seconds. Slowly lift your head. Let the muscles of your belly relax. Point your tailbone up toward the ceiling so your back forms a sagging arch like  the back of a cow. Stay in this position for 5 seconds.  Press-ups Do these steps 5-10 times in a row: Lie on your belly (face-down) on a firm bed or the floor. Place your hands near your head, about shoulder-width apart. While you keep your back relaxed and keep your hips on the floor, slowly straighten your arms to raise the top half of your body and lift your shoulders. Do not use your back muscles. You  may change where you place your hands to make yourself more comfortable. Stay in this position for 5 seconds. Keep your back relaxed. Slowly return to lying flat on the floor.  Bridges Do these steps 10 times in a row: Lie on your back on a firm bed or the floor. Bend your knees so they point up to the ceiling. Your feet should be flat on the floor. Your arms should be flat at your sides, next to your body. Tighten your butt muscles and lift your butt off the floor until your waist is almost as high as your knees. If you do not feel the muscles working in your butt and the back of your thighs, slide your feet 1-2 inches (2.5-5 cm) farther away from your butt. Stay in this position for 3-5 seconds. Slowly lower your butt to the floor, and let your butt muscles relax. If this exercise is too easy, try doing it with your arms crossed over your chest. Belly crunches Do these steps 5-10 times in a row: Lie on your back on a firm bed or the floor with your legs stretched out. Bend your knees so they point up to the ceiling. Your feet should be flat on the floor. Cross your arms over your chest. Tip your chin a little bit toward your chest, but do not bend your neck. Tighten your belly muscles and slowly raise your chest just enough to lift your shoulder blades a tiny bit off the floor. Avoid raising your body higher than that because it can put too much stress on your lower back. Slowly lower your chest and your head to the floor. Back lifts Do these steps 5-10 times in a row: Lie on your belly (face-down) with your arms at your sides, and rest your forehead on the floor. Tighten the muscles in your legs and your butt. Slowly lift your chest off the floor while you keep your hips on the floor. Keep the back of your head in line with the curve in your back. Look at the floor while you do this. Stay in this position for 3-5 seconds. Slowly lower your chest and your face to the floor. Contact a  doctor if: Your back pain gets a lot worse when you do an exercise. Your back pain does not get better within 2 hours after you exercise. If you have any of these problems, stop doing the exercises. Do not do them again unless your doctor says it is okay. Get help right away if: You have sudden, very bad back pain. If this happens, stop doing the exercises. Do not do them again unless your doctor says it is okay. This information is not intended to replace advice given to you by your health care provider. Make sure you discuss any questions you have with your health care provider. Document Revised: 07/23/2020 Document Reviewed: 07/23/2020 Elsevier Patient Education  2024 [employer].

## 2023-01-18 NOTE — Progress Notes (Signed)
Subjective:    Patient ID: Lisa Levine, female    DOB: 1975-06-24, 47 y.o.   MRN: [id number]  HPI Discussed the use of AI scribe software for clinical note transcription with the patient, who gave verbal consent to proceed.  History of Present Illness   The patient, with a history of diabetes and neuropathy, presents with lower back pain and left sciatic discomfort. The pain began following a 17-hour flight from [place] to [place] on the auguat 15th and has persisted since. The pain is constant, but intensifies upon standing from a seated position, with sharp shooting pain radiating from the lower back to the buttock and down the hamstring. The pain does not extend past the knee.  The patient has been managing the pain with stretches, massage, and a prescribed muscle relaxer, but reports that the pain remains significant and makes sitting for extended periods difficult. The patient also reports increased weakness in the left leg, leading to two falls, and worsening of baseline neuropathy symptoms in the left leg.  In addition to the back pain, the patient had a severe ingrown hair in the groin area, which was treated with amoxicillin and has since improved. The patient has been taking gabapentin for neuropathy, and a muscle relaxer and ibuprofen prescribed by the emergency department for the back pain. The muscle relaxer has been causing excessive sleepiness. The patient also tried meloxicam and tramadol for the pain, but found them ineffective.         Review of Systems  Constitutional:  Negative for chills, fatigue and fever.  Respiratory:  Negative for cough, chest tightness, shortness of breath and wheezing.   Cardiovascular:  Negative for chest pain and palpitations.  Gastrointestinal:  Negative for abdominal pain, constipation and vomiting.  Genitourinary:  Negative for dysuria and frequency.  Musculoskeletal:  Positive for back pain.       Left si area tenderness.  Skin:  Negative for  rash.  Neurological:  Negative for dizziness and light-headedness.  Hematological:  Negative for adenopathy. Does not bruise/bleed easily.  Psychiatric/Behavioral:  Negative for behavioral problems and dysphoric mood. The patient is not nervous/anxious.     Past Medical History:  Diagnosis Date   Allergy    Arthritis    DVT (deep venous thrombosis) (HCC)    "often on my LLE" (06/04/2016)   Hyperlipidemia    Hypertension    Migraine    "q couple months" (06/04/2016)   Peripheral neuropathy    Thyroid disease    Type 1 diabetes mellitus (HCC)    Vitamin D deficiency      Social History   Socioeconomic History   Marital status: Married    Spouse name: Not on file   Number of children: 3   Years of education: Not on file   Highest education level: Bachelor's degree (e.g., BA, AB, BS)  Occupational History   Occupation: [employer]  Tobacco Use   Smoking status: Never   Smokeless tobacco: Never  Substance and Sexual Activity   Alcohol use: No   Drug use: No   Sexual activity: Yes  Other Topics Concern   Not on file  Social History Narrative   Not on file   Social Determinants of Health   Financial Resource Strain: Low Risk  (01/17/2023)   Overall Financial Resource Strain (CARDIA)    Difficulty of Paying Living Expenses: Not very hard  Food Insecurity: No Food Insecurity (01/17/2023)   Hunger Vital Sign    Worried  About Running Out of Food in the Last Year: Never true    Ran Out of Food in the Last Year: Never true  Transportation Needs: No Transportation Needs (01/17/2023)   PRAPARE - Administrator, Civil Service (Medical): No    Lack of Transportation (Non-Medical): No  Physical Activity: Unknown (01/17/2023)   Exercise Vital Sign    Days of Exercise per Week: 0 days    Minutes of Exercise per Session: Not on file  Stress: Stress Concern Present (01/17/2023)   [employer] of Occupational Health - Occupational Stress Questionnaire     Feeling of Stress : Rather much  Social Connections: Socially Integrated (01/17/2023)   Social Connection and Isolation Panel [NHANES]    Frequency of Communication with Friends and Family: More than three times a week    Frequency of Social Gatherings with Friends and Family: More than three times a week    Attends Religious Services: 1 to 4 times per year    Active Member of [employer] or Organizations: Yes    Attends Engineer, structural: More than 4 times per year    Marital Status: Married  Catering manager Violence: Not on file    Past Surgical History:  Procedure Laterality Date   insulin pump  2005-present   "upgrades q 3 yrs" (06/04/2016)    Family History  Problem Relation Age of Onset   Breast cancer Mother    Hyperlipidemia Father    Hypertension Father    Colon cancer Paternal Grandfather    Lung cancer Paternal Uncle        smoler   Liver disease Paternal Uncle     No Known Allergies  Current Outpatient Medications on File Prior to Visit  Medication Sig Dispense Refill   acetaminophen (TYLENOL) 500 MG tablet Take 500 mg by mouth 3 (three) times daily as needed for mild pain.     atorvastatin (LIPITOR) 10 MG tablet Take 1 tablet (10 mg total) by mouth daily. 90 tablet 3   cetirizine (ZYRTEC) 10 MG tablet Take 10 mg by mouth daily as needed for allergies.     chlorhexidine (PERIDEX) 0.12 % solution Use as directed 5 mLs in the mouth or throat 2 (two) times daily.     diclofenac Sodium (VOLTAREN) 1 % GEL APPLY 4 GRAMS TOPICALLY TO THE AFFECTED AREA FOUR TIMES DAILY 100 g 5   fluconazole (DIFLUCAN) 150 MG tablet Take 150 mg by mouth once.     gabapentin (NEURONTIN) 600 MG tablet Take 600 mg by mouth 4 (four) times daily.      ibuprofen (ADVIL) 600 MG tablet Take 1 tablet (600 mg total) by mouth every 8 (eight) hours as needed. 90 tablet 1   insulin aspart (NOVOLOG) 100 UNIT/ML injection Use as needed in insulin pump.  MDD: 66 units/day     Insulin Human (INSULIN  PUMP) SOLN Inject into the skin continuous. Use with Humalog - basal rate     insulin NPH Human (HUMULIN N,NOVOLIN N) 100 UNIT/ML injection Inject 6-45 Units into the skin See admin instructions. Used withInsulin pump 35-45 units per day per basal rate...Marland KitchenMarland KitchenInject 6-12 units subcutaneously per sliding scale as needed for blood sugar correction with insulin pump     levocetirizine (XYZAL) 5 MG tablet Take 1 tablet (5 mg total) by mouth every evening. 30 tablet 3   meloxicam (MOBIC) 7.5 MG tablet Take 7.5 mg by mouth daily.     traMADol (ULTRAM) 50 MG tablet Take 1  tablet (50 mg total) by mouth every 6 (six) hours as needed. 20 tablet 0   triamcinolone ointment (KENALOG) 0.5 % Apply 1 Application topically 2 (two) times daily. For moderate to severe eczema.  Do not use for more than 1 week at a time. 60 g 3   Vitamin D, Ergocalciferol, (DRISDOL) 1.25 MG (50000 UNIT) CAPS capsule Take 1 capsule (50,000 Units total) by mouth every 7 (seven) days. 8 capsule 0   No current facility-administered medications on file prior to visit.    BP 118/74   Pulse 79   Resp 18   Ht 4\' 10"  (1.473 m)   Wt 113 lb 9.6 oz (51.5 kg)   LMP 06/02/2010 (Approximate) Comment: pt states she has not had a period in 12-13 years  SpO2 98%   BMI 23.74 kg/m          Objective:   Physical Exam  General Appearance- Not in acute distress.    Chest and Lung Exam Auscultation: Breath sounds:-Normal. Clear even and unlabored. Adventitious sounds:- No Adventitious sounds.  Cardiovascular Auscultation:Rythm - Regular, rate and rythm. Heart Sounds -Normal heart sounds.  Abdomen Inspection:-Inspection Normal.  Palpation/Perucssion: Palpation and Percussion of the abdomen reveal- Non Tender, No Rebound tenderness, No rigidity(Guarding) and No Palpable abdominal masses.  Liver:-Normal.  Spleen:- Normal.   Back No Mid lumbar spine tenderness to palpation. But left si area tender to palpation. Pain on straight leg  lift. Pain on lateral movements and flexion/extension of the spine.  Lower ext neurologic  L5-S1 sensation intact bilaterally. Normal patellar reflexes bilaterally. No foot drop bilaterally.  Lower ext- 5/5 equal and symmertic strength.      Assessment & Plan:   Assessment and Plan    Sciatica Persistent left-sided lower back pain radiating to the buttock and hamstring, exacerbated by sitting and standing. Recent increase in weakness and numbness in the left leg, likely exacerbated by underlying diabetic neuropathy. No saddle anesthesia. -Continue gabapentin 600mg  TID for neuropathy. -Continue Robaxin 500mg  BID, but reduce to once at night due to excessive sleepiness. -Start a 3-day taper of low-dose Medrol (4mg  tablets:3 tablets on day 1, 2 tablets on day 2, 1 tablet on day 3). -after medrol start meloxicam 15 mg daily(stop other ibuprofen) -Apply Solanpas lidocaine patch to the left SI area. -Continue stretching exercises. -Consider physical therapy if no improvement. -Consider MRI if severe pain, extreme leg weakness, or extreme numbness develops. --Consider tramadol for resistant pain or neuropathy.  Skin Infection Resolved ingrown hair in the groin area treated with amoxicillin. -No further action required unless symptoms recur.  Diabetes Mellitus Well-controlled with insulin pump. Baseline neuropathy. -Continue current management.  Fatigue Patient reports excessive tiredness, possibly related to Robaxin or low vitamin D levels. -Check vitamin D levels today. -Consider tramadol for resistant pain or neuropathy.   Follow up in 10 days or sooner if needed   [employer], PA-C

## 2023-02-08 ENCOUNTER — Ambulatory Visit (INDEPENDENT_AMBULATORY_CARE_PROVIDER_SITE_OTHER): Payer: Medicare Other | Admitting: Medical

## 2023-02-08 ENCOUNTER — Encounter: Payer: Self-pay | Admitting: Medical

## 2023-02-08 VITALS — BP 110/72 | HR 73 | Temp 98.6°F | Resp 16 | Ht <= 58 in | Wt 114.8 lb

## 2023-02-08 DIAGNOSIS — M5442 Lumbago with sciatica, left side: Secondary | ICD-10-CM

## 2023-02-08 DIAGNOSIS — E559 Vitamin D deficiency, unspecified: Secondary | ICD-10-CM

## 2023-02-08 DIAGNOSIS — E538 Deficiency of other specified B group vitamins: Secondary | ICD-10-CM

## 2023-02-08 DIAGNOSIS — R232 Flushing: Secondary | ICD-10-CM

## 2023-02-08 MED ORDER — VITAMIN D (ERGOCALCIFEROL) 1.25 MG (50000 UNIT) PO CAPS: 50000.0000 [IU] | ORAL_CAPSULE | ORAL | 0 refills | Status: DC

## 2023-02-08 MED ORDER — METAXALONE 800 MG PO TABS: 800.0000 mg | ORAL_TABLET | Freq: Three times a day (TID) | ORAL | 0 refills | Status: DC

## 2023-02-08 NOTE — Progress Notes (Signed)
Subjective:    Patient ID: Lisa Levine, female    DOB: 1976-05-02, 47 y.o.   MRN: [id number]  HPI  Pt in for follow up on   Pt in for follow up on Sciatic.(Much improved but took about a month)  Pt states medrol 3 day taper dose helped a lot. Decreased a great deal. Now only residual neuropathy after sitting for long hours. Used gabapentin twice daily.   She only used tramadol mid day at work.   Currently on meloxicam. She wants to try different muscle relaxant. She is on robaxin.  On discussion she is willing to try skelaxin.   Pt states she wants to avoid any epidural injection due to increased sugar levels.   Past Medical History:  Diagnosis Date   Allergy    Arthritis    DVT (deep venous thrombosis) (HCC)    "often on my LLE" (06/04/2016)   Hyperlipidemia    Hypertension    Migraine    "q couple months" (06/04/2016)   Peripheral neuropathy    Thyroid disease    Type 1 diabetes mellitus (HCC)    Vitamin D deficiency      Social History   Socioeconomic History   Marital status: Married    Spouse name: Not on file   Number of children: 3   Years of education: Not on file   Highest education level: Bachelor's degree (e.g., BA, AB, BS)  Occupational History   Occupation: [employer]  Tobacco Use   Smoking status: Never   Smokeless tobacco: Never  Substance and Sexual Activity   Alcohol use: No   Drug use: No   Sexual activity: Yes  Other Topics Concern   Not on file  Social History Narrative   Not on file   Social Determinants of Health   Financial Resource Strain: Low Risk  (01/17/2023)   Overall Financial Resource Strain (CARDIA)    Difficulty of Paying Living Expenses: Not very hard  Food Insecurity: No Food Insecurity (01/17/2023)   Hunger Vital Sign    Worried About Running Out of Food in the Last Year: Never true    Ran Out of Food in the Last Year: Never true  Transportation Needs: No Transportation Needs (01/17/2023)   PRAPARE -  Administrator, Civil Service (Medical): No    Lack of Transportation (Non-Medical): No  Physical Activity: Unknown (01/17/2023)   Exercise Vital Sign    Days of Exercise per Week: 0 days    Minutes of Exercise per Session: Not on file  Stress: Stress Concern Present (01/17/2023)   [employer] of Occupational Health - Occupational Stress Questionnaire    Feeling of Stress : Rather much  Social Connections: Socially Integrated (01/17/2023)   Social Connection and Isolation Panel [NHANES]    Frequency of Communication with Friends and Family: More than three times a week    Frequency of Social Gatherings with Friends and Family: More than three times a week    Attends Religious Services: 1 to 4 times per year    Active Member of [employer] or Organizations: Yes    Attends Engineer, structural: More than 4 times per year    Marital Status: Married  Catering manager Violence: Not on file    Past Surgical History:  Procedure Laterality Date   insulin pump  2005-present   "upgrades q 3 yrs" (06/04/2016)    Family History  Problem Relation Age of Onset   Breast cancer Mother  Hyperlipidemia Father    Hypertension Father    Colon cancer Paternal Grandfather    Lung cancer Paternal Uncle        smoler   Liver disease Paternal Uncle     No Known Allergies  Current Outpatient Medications on File Prior to Visit  Medication Sig Dispense Refill   acetaminophen (TYLENOL) 500 MG tablet Take 500 mg by mouth 3 (three) times daily as needed for mild pain.     atorvastatin (LIPITOR) 10 MG tablet Take 1 tablet (10 mg total) by mouth daily. 90 tablet 3   cetirizine (ZYRTEC) 10 MG tablet Take 10 mg by mouth daily as needed for allergies.     chlorhexidine (PERIDEX) 0.12 % solution Use as directed 5 mLs in the mouth or throat 2 (two) times daily.     diclofenac Sodium (VOLTAREN) 1 % GEL APPLY 4 GRAMS TOPICALLY TO THE AFFECTED AREA FOUR TIMES DAILY 100 g 5   fluconazole  (DIFLUCAN) 150 MG tablet Take 150 mg by mouth once.     gabapentin (NEURONTIN) 600 MG tablet Take 600 mg by mouth 4 (four) times daily.      ibuprofen (ADVIL) 600 MG tablet Take 1 tablet (600 mg total) by mouth every 8 (eight) hours as needed. 90 tablet 1   insulin aspart (NOVOLOG) 100 UNIT/ML injection Use as needed in insulin pump.  MDD: 66 units/day     Insulin Human (INSULIN PUMP) SOLN Inject into the skin continuous. Use with Humalog - basal rate     insulin NPH Human (HUMULIN N,NOVOLIN N) 100 UNIT/ML injection Inject 6-45 Units into the skin See admin instructions. Used withInsulin pump 35-45 units per day per basal rate...Marland KitchenMarland KitchenInject 6-12 units subcutaneously per sliding scale as needed for blood sugar correction with insulin pump     levocetirizine (XYZAL) 5 MG tablet Take 1 tablet (5 mg total) by mouth every evening. 30 tablet 3   meloxicam (MOBIC) 7.5 MG tablet Take 7.5 mg by mouth daily.     methylPREDNISolone (MEDROL) 4 MG tablet 3 tab po day 1, 2 tab po day 2 and 1 tab po day 3. 6 tablet 0   traMADol (ULTRAM) 50 MG tablet Take 1 tablet (50 mg total) by mouth every 6 (six) hours as needed. 20 tablet 0   triamcinolone ointment (KENALOG) 0.5 % Apply 1 Application topically 2 (two) times daily. For moderate to severe eczema.  Do not use for more than 1 week at a time. 60 g 3   No current facility-administered medications on file prior to visit.    BP 110/72 (BP Location: Left Arm, Patient Position: Sitting, Cuff Size: Normal)   Pulse 73   Temp 98.6 F (37 C) (Oral)   Resp 16   Ht 4\' 10"  (1.473 m)   Wt 114 lb 12.8 oz (52.1 kg)   LMP 06/02/2010 (Approximate) Comment: pt states she has not had a period in 12-13 years  SpO2 99%   BMI 23.99 kg/m      Review of Systems  Constitutional:  Positive for fatigue. Negative for chills and fever.  Respiratory:  Negative for choking, shortness of breath and wheezing.   Cardiovascular:  Negative for chest pain and palpitations.   Gastrointestinal:  Negative for abdominal pain.  Musculoskeletal:  Negative for myalgias.       Sciatica.   Neurological:  Negative for dizziness, seizures, weakness and light-headedness.  Hematological:  Negative for adenopathy. Does not bruise/bleed easily.  Psychiatric/Behavioral:  Negative for behavioral problems,  dysphoric mood and suicidal ideas. The patient is not nervous/anxious.    Past Medical History:  Diagnosis Date   Allergy    Arthritis    DVT (deep venous thrombosis) (HCC)    "often on my LLE" (06/04/2016)   Hyperlipidemia    Hypertension    Migraine    "q couple months" (06/04/2016)   Peripheral neuropathy    Thyroid disease    Type 1 diabetes mellitus (HCC)    Vitamin D deficiency      Social History   Socioeconomic History   Marital status: Married    Spouse name: Not on file   Number of children: 3   Years of education: Not on file   Highest education level: Bachelor's degree (e.g., BA, AB, BS)  Occupational History   Occupation: [employer]  Tobacco Use   Smoking status: Never   Smokeless tobacco: Never  Substance and Sexual Activity   Alcohol use: No   Drug use: No   Sexual activity: Yes  Other Topics Concern   Not on file  Social History Narrative   Not on file   Social Determinants of Health   Financial Resource Strain: Low Risk  (01/17/2023)   Overall Financial Resource Strain (CARDIA)    Difficulty of Paying Living Expenses: Not very hard  Food Insecurity: No Food Insecurity (01/17/2023)   Hunger Vital Sign    Worried About Running Out of Food in the Last Year: Never true    Ran Out of Food in the Last Year: Never true  Transportation Needs: No Transportation Needs (01/17/2023)   PRAPARE - Administrator, Civil Service (Medical): No    Lack of Transportation (Non-Medical): No  Physical Activity: Unknown (01/17/2023)   Exercise Vital Sign    Days of Exercise per Week: 0 days    Minutes of Exercise per Session:  Not on file  Stress: Stress Concern Present (01/17/2023)   [employer] of Occupational Health - Occupational Stress Questionnaire    Feeling of Stress : Rather much  Social Connections: Socially Integrated (01/17/2023)   Social Connection and Isolation Panel [NHANES]    Frequency of Communication with Friends and Family: More than three times a week    Frequency of Social Gatherings with Friends and Family: More than three times a week    Attends Religious Services: 1 to 4 times per year    Active Member of [employer] or Organizations: Yes    Attends Engineer, structural: More than 4 times per year    Marital Status: Married  Catering manager Violence: Not on file    Past Surgical History:  Procedure Laterality Date   insulin pump  2005-present   "upgrades q 3 yrs" (06/04/2016)    Family History  Problem Relation Age of Onset   Breast cancer Mother    Hyperlipidemia Father    Hypertension Father    Colon cancer Paternal Grandfather    Lung cancer Paternal Uncle        smoler   Liver disease Paternal Uncle     No Known Allergies  Current Outpatient Medications on File Prior to Visit  Medication Sig Dispense Refill   acetaminophen (TYLENOL) 500 MG tablet Take 500 mg by mouth 3 (three) times daily as needed for mild pain.     atorvastatin (LIPITOR) 10 MG tablet Take 1 tablet (10 mg total) by mouth daily. 90 tablet 3   cetirizine (ZYRTEC) 10 MG tablet Take 10 mg by mouth daily  as needed for allergies.     chlorhexidine (PERIDEX) 0.12 % solution Use as directed 5 mLs in the mouth or throat 2 (two) times daily.     diclofenac Sodium (VOLTAREN) 1 % GEL APPLY 4 GRAMS TOPICALLY TO THE AFFECTED AREA FOUR TIMES DAILY 100 g 5   fluconazole (DIFLUCAN) 150 MG tablet Take 150 mg by mouth once.     gabapentin (NEURONTIN) 600 MG tablet Take 600 mg by mouth 4 (four) times daily.      ibuprofen (ADVIL) 600 MG tablet Take 1 tablet (600 mg total) by mouth every 8 (eight) hours as needed.  90 tablet 1   insulin aspart (NOVOLOG) 100 UNIT/ML injection Use as needed in insulin pump.  MDD: 66 units/day     Insulin Human (INSULIN PUMP) SOLN Inject into the skin continuous. Use with Humalog - basal rate     insulin NPH Human (HUMULIN N,NOVOLIN N) 100 UNIT/ML injection Inject 6-45 Units into the skin See admin instructions. Used withInsulin pump 35-45 units per day per basal rate...Marland KitchenMarland KitchenInject 6-12 units subcutaneously per sliding scale as needed for blood sugar correction with insulin pump     levocetirizine (XYZAL) 5 MG tablet Take 1 tablet (5 mg total) by mouth every evening. 30 tablet 3   meloxicam (MOBIC) 7.5 MG tablet Take 7.5 mg by mouth daily.     methylPREDNISolone (MEDROL) 4 MG tablet 3 tab po day 1, 2 tab po day 2 and 1 tab po day 3. 6 tablet 0   traMADol (ULTRAM) 50 MG tablet Take 1 tablet (50 mg total) by mouth every 6 (six) hours as needed. 20 tablet 0   triamcinolone ointment (KENALOG) 0.5 % Apply 1 Application topically 2 (two) times daily. For moderate to severe eczema.  Do not use for more than 1 week at a time. 60 g 3   No current facility-administered medications on file prior to visit.    BP 110/72 (BP Location: Left Arm, Patient Position: Sitting, Cuff Size: Normal)   Pulse 73   Temp 98.6 F (37 C) (Oral)   Resp 16   Ht 4\' 10"  (1.473 m)   Wt 114 lb 12.8 oz (52.1 kg)   LMP 06/02/2010 (Approximate) Comment: pt states she has not had a period in 12-13 years  SpO2 99%   BMI 23.99 kg/m        Objective:   Physical Exam  General Appearance- Not in acute distress.       Chest and Lung Exam Auscultation: Breath sounds:-Normal. Clear even and unlabored. Adventitious sounds:- No Adventitious sounds.   Cardiovascular Auscultation:Rythm - Regular, rate and rythm. Heart Sounds -Normal heart sounds.   Abdomen Inspection:-Inspection Normal.  Palpation/Perucssion: Palpation and Percussion of the abdomen reveal- Non Tender, No Rebound tenderness, No  rigidity(Guarding) and No Palpable abdominal masses.  Liver:-Normal.  Spleen:- Normal.    Back No Mid lumbar spine tenderness to palpation. But left si area tender to palpation. Pain on straight leg lift. Pain on lateral movements and flexion/extension of the spine.   Lower ext neurologic   L5-S1 sensation intact bilaterally. Normal patellar reflexes bilaterally. No foot drop bilaterally.  Lower ext- 5/5 equal and symmertic strength.      Assessment & Plan:   Patient Instructions  1. Vitamin D deficiency Ergocalciferol 50, 0000 unit tab weekly for 8 weeks. Can also [place] your vit d 10,000 weekly as well. Get scheduled for vit d level in 9 weeks. One week after you finish vit D. With your  very low levels in past want to see how high your vit D increases to. Then decide on dose going forward - VITAMIN D 25 Hydroxy (Vit-D Deficiency, Fractures); Future  2. Left-sided low back pain with left-sided sciatica, unspecified chronicity -overall much improved with residual symptoms late in day. - continue meloxicam 7.5 mg at night, gabapentin 600 mg twice daily. Can use tramadol mid day if needed.  -dc robaxin when you try skelaxin. Rx advisement given. Use at home first to assess degree of sedation.  3. B12 deficiency On injection every 2 weeks. If b12 normal or high can switch to oral b12. - B12   Follow up date to be determined after lab review.    Esperanza Richters, PA-C

## 2023-02-08 NOTE — Patient Instructions (Addendum)
1. Vitamin D deficiency Ergocalciferol 50, 0000 unit tab weekly for 8 weeks. Can also [place] your vit d 10,000 weekly as well. Get scheduled for vit d level in 9 weeks. One week after you finish vit D. With your very low levels in past want to see how high your vit D increases to. Then decide on dose going forward - VITAMIN D 25 Hydroxy (Vit-D Deficiency, Fractures); Future  2. Left-sided low back pain with left-sided sciatica, unspecified chronicity -overall much improved with residual symptoms late in day. - continue meloxicam 7.5 mg at night, gabapentin 600 mg twice daily. Can use tramadol mid day if needed.  -dc robaxin when you try skelaxin. Rx advisement given. Use at home first to assess degree of sedation.  3. B12 deficiency On injection every 2 weeks. If b12 normal or high can switch to oral b12. - B12   4. End noted hot flashed randomly. Pt has iud that prevents her cycles. Ordered fsh today.  Follow up date to be determined after lab review.

## 2023-02-21 ENCOUNTER — Other Ambulatory Visit: Payer: Self-pay | Admitting: Medical

## 2023-02-21 DIAGNOSIS — Z9641 Presence of insulin pump (external) (internal): Secondary | ICD-10-CM | POA: Diagnosis not present

## 2023-02-21 DIAGNOSIS — Z978 Presence of other specified devices: Secondary | ICD-10-CM | POA: Diagnosis not present

## 2023-02-21 DIAGNOSIS — E1042 Type 1 diabetes mellitus with diabetic polyneuropathy: Secondary | ICD-10-CM | POA: Diagnosis not present

## 2023-03-07 ENCOUNTER — Encounter: Payer: Self-pay | Admitting: Medical

## 2023-03-08 MED ORDER — METHOCARBAMOL 750 MG PO TABS: 750.0000 mg | ORAL_TABLET | Freq: Three times a day (TID) | ORAL | 0 refills | Status: DC | PRN

## 2023-03-08 NOTE — Addendum Note (Signed)
Addended by: Gwenevere Abbot on: 03/08/2023 12:42 PM   Modules accepted: Orders

## 2023-03-18 ENCOUNTER — Other Ambulatory Visit (HOSPITAL_COMMUNITY): Payer: Self-pay

## 2023-04-07 ENCOUNTER — Other Ambulatory Visit (INDEPENDENT_AMBULATORY_CARE_PROVIDER_SITE_OTHER): Payer: Medicare Other

## 2023-04-07 DIAGNOSIS — E559 Vitamin D deficiency, unspecified: Secondary | ICD-10-CM

## 2023-04-07 LAB — VITAMIN D 25 HYDROXY (VIT D DEFICIENCY, FRACTURES): VITD: 19.35 ng/mL — ABNORMAL LOW (ref 30.00–100.00)

## 2023-04-11 ENCOUNTER — Ambulatory Visit: Payer: Medicare Other | Admitting: Medical

## 2023-04-12 ENCOUNTER — Other Ambulatory Visit: Payer: Medicare Other

## 2023-04-12 ENCOUNTER — Ambulatory Visit (INDEPENDENT_AMBULATORY_CARE_PROVIDER_SITE_OTHER): Payer: Medicare Other | Admitting: Medical

## 2023-04-12 ENCOUNTER — Ambulatory Visit: Payer: Medicare Other | Admitting: Medical

## 2023-04-12 VITALS — BP 118/72 | HR 82 | Resp 18 | Ht <= 58 in | Wt 115.8 lb

## 2023-04-12 DIAGNOSIS — T300 Burn of unspecified body region, unspecified degree: Secondary | ICD-10-CM

## 2023-04-12 DIAGNOSIS — L659 Nonscarring hair loss, unspecified: Secondary | ICD-10-CM

## 2023-04-12 DIAGNOSIS — E559 Vitamin D deficiency, unspecified: Secondary | ICD-10-CM

## 2023-04-12 MED ORDER — METHOCARBAMOL 750 MG PO TABS: 750.0000 mg | ORAL_TABLET | Freq: Three times a day (TID) | ORAL | 0 refills | Status: DC | PRN

## 2023-04-12 MED ORDER — SILVER SULFADIAZINE 1 % EX CREA
1.0000 | TOPICAL_CREAM | Freq: Two times a day (BID) | CUTANEOUS | 0 refills | Status: DC
Start: 2023-04-12 — End: 2023-08-23

## 2023-04-12 MED ORDER — VITAMIN D (ERGOCALCIFEROL) 1.25 MG (50000 UNIT) PO CAPS: 50000.0000 [IU] | ORAL_CAPSULE | ORAL | 0 refills | Status: DC

## 2023-04-12 NOTE — Patient Instructions (Signed)
Vitamin D deficiency - vit d weekly 50,000 units for 8 week and vit otc 4000 international units daily. Try to get sun exposure as best level by history was when you had a lot of sun exposure - VITAMIN D 25 Hydroxy (Vit-D Deficiency, Fractures); Future   Hair loss -possible stress related. Try minoxidil over the counter. If hair loss progresses can try to refer to dermatologist.    Skin burn -rx silvadene to use twice daily. Cover with bandaid at work.  Follow up date to be determined after vit d level in 9 weeks.

## 2023-04-12 NOTE — Progress Notes (Signed)
Subjective:    Patient ID: Lisa Levine, female    DOB: [birth year], 47 y.o.   MRN: [id number]  HPI  Pt in for follow up.  Pt has low vit D. Pt recent vit D level was 19.35. she has been on vit D 50,000 units weekly.  She also had 2000 international unit tabs in the past which she used daily but recently she stopped those as started high dose weekly version.  On review 02-15-2019 she had very good level of vit d. She notes she was on mission trip in [place] and was in son for 3 months.  Recent a lot of hair loss. She shows me picture of handful of hair. Recently a lot hair. Pt thyroid level was normal.   Also left thumb skin burn veg oil splash burn. Area mild burn. Faint tender.  Review of Systems  Constitutional:  Negative for chills, fatigue and fever.  Respiratory:  Negative for chest tightness, shortness of breath and wheezing.   Cardiovascular:  Negative for chest pain and palpitations.  Gastrointestinal:  Negative for abdominal pain and blood in stool.  Genitourinary:  Negative for dysuria.  Musculoskeletal:  Negative for back pain and joint swelling.  Skin:  Negative for rash.       Hair loss.  Neurological:  Negative for dizziness, speech difficulty and light-headedness.  Hematological:  Negative for adenopathy. Does not bruise/bleed easily.  Psychiatric/Behavioral:  Negative for behavioral problems, decreased concentration and self-injury. The patient is not hyperactive.    Past Medical History:  Diagnosis Date   Allergy    Arthritis    DVT (deep venous thrombosis) (HCC)    "often on my LLE" (06/04/2016)   Hyperlipidemia    Hypertension    Migraine    "q couple months" (06/04/2016)   Peripheral neuropathy    Thyroid disease    Type 1 diabetes mellitus (HCC)    Vitamin D deficiency      Social History   Socioeconomic History   Marital status: Married    Spouse name: Not on file   Number of children: 3   Years of education: Not on file   Highest education  level: Professional school degree (e.g., MD, DDS, DVM, JD)  Occupational History   Occupation: [employer]  Tobacco Use   Smoking status: Never   Smokeless tobacco: Never  Substance and Sexual Activity   Alcohol use: No   Drug use: No   Sexual activity: Yes  Other Topics Concern   Not on file  Social History Narrative   Not on file   Social Determinants of Health   Financial Resource Strain: Low Risk  (04/05/2023)   Overall Financial Resource Strain (CARDIA)    Difficulty of Paying Living Expenses: Not very hard  Food Insecurity: No Food Insecurity (04/05/2023)   Hunger Vital Sign    Worried About Running Out of Food in the Last Year: Never true    Ran Out of Food in the Last Year: Never true  Transportation Needs: No Transportation Needs (04/05/2023)   PRAPARE - Administrator, Civil Service (Medical): No    Lack of Transportation (Non-Medical): No  Physical Activity: Insufficiently Active (04/05/2023)   Exercise Vital Sign    Days of Exercise per Week: 3 days    Minutes of Exercise per Session: 20 min  Stress: Stress Concern Present (04/05/2023)   [employer] of Occupational Health - Occupational Stress Questionnaire    Feeling of Stress : Very  much  Social Connections: Moderately Integrated (04/05/2023)   Social Connection and Isolation Panel [NHANES]    Frequency of Communication with Friends and Family: More than three times a week    Frequency of Social Gatherings with Friends and Family: Once a week    Attends Religious Services: Never    Database administrator or Organizations: Yes    Attends Engineer, structural: 1 to 4 times per year    Marital Status: Married  Catering manager Violence: Not on file    Past Surgical History:  Procedure Laterality Date   insulin pump  2005-present   "upgrades q 3 yrs" (06/04/2016)    Family History  Problem Relation Age of Onset   Breast cancer Mother    Hyperlipidemia Father     Hypertension Father    Colon cancer Paternal Grandfather    Lung cancer Paternal Uncle        smoler   Liver disease Paternal Uncle     No Known Allergies  Current Outpatient Medications on File Prior to Visit  Medication Sig Dispense Refill   acetaminophen (TYLENOL) 500 MG tablet Take 500 mg by mouth 3 (three) times daily as needed for mild pain.     atorvastatin (LIPITOR) 10 MG tablet Take 1 tablet (10 mg total) by mouth daily. 90 tablet 3   cetirizine (ZYRTEC) 10 MG tablet Take 10 mg by mouth daily as needed for allergies.     chlorhexidine (PERIDEX) 0.12 % solution Use as directed 5 mLs in the mouth or throat 2 (two) times daily.     diclofenac Sodium (VOLTAREN) 1 % GEL APPLY 4 GRAMS TOPICALLY TO THE AFFECTED AREA FOUR TIMES DAILY 100 g 5   fluconazole (DIFLUCAN) 150 MG tablet Take 150 mg by mouth once.     gabapentin (NEURONTIN) 600 MG tablet Take 600 mg by mouth 4 (four) times daily.      ibuprofen (ADVIL) 600 MG tablet Take 1 tablet (600 mg total) by mouth every 8 (eight) hours as needed. 90 tablet 1   insulin aspart (NOVOLOG) 100 UNIT/ML injection Use as needed in insulin pump.  MDD: 66 units/day     Insulin Human (INSULIN PUMP) SOLN Inject into the skin continuous. Use with Humalog - basal rate     insulin NPH Human (HUMULIN N,NOVOLIN N) 100 UNIT/ML injection Inject 6-45 Units into the skin See admin instructions. Used withInsulin pump 35-45 units per day per basal rate...Marland KitchenMarland KitchenInject 6-12 units subcutaneously per sliding scale as needed for blood sugar correction with insulin pump     levocetirizine (XYZAL) 5 MG tablet Take 1 tablet (5 mg total) by mouth every evening. 30 tablet 3   meloxicam (MOBIC) 7.5 MG tablet TAKE 1 TABLET(7.5 MG) BY MOUTH DAILY WITH FOOD 30 tablet 0   metaxalone (SKELAXIN) 800 MG tablet Take 1 tablet (800 mg total) by mouth 3 (three) times daily. 90 tablet 0   methocarbamol (ROBAXIN) 750 MG tablet Take 1 tablet (750 mg total) by mouth every 8 (eight) hours as  needed for muscle spasms. 30 tablet 0   methylPREDNISolone (MEDROL) 4 MG tablet 3 tab po day 1, 2 tab po day 2 and 1 tab po day 3. 6 tablet 0   traMADol (ULTRAM) 50 MG tablet Take 1 tablet (50 mg total) by mouth every 6 (six) hours as needed. 20 tablet 0   triamcinolone ointment (KENALOG) 0.5 % Apply 1 Application topically 2 (two) times daily. For moderate to severe eczema.  Do  not use for more than 1 week at a time. 60 g 3   Vitamin D, Ergocalciferol, (DRISDOL) 1.25 MG (50000 UNIT) CAPS capsule Take 1 capsule (50,000 Units total) by mouth every 7 (seven) days. 8 capsule 0   No current facility-administered medications on file prior to visit.    BP 118/72   Pulse 82   Resp 18   Ht 4\' 10"  (1.473 m)   Wt 115 lb 12.8 oz (52.5 kg)   LMP 06/02/2010 (Approximate) Comment: pt states she has not had a period in 12-13 years  SpO2 100%   BMI 24.20 kg/m        Objective:   Physical Exam  General Mental Status- Alert. General Appearance- Not in acute distress.   Skin General: Color- Normal Color. Moisture- Normal Moisture.  Neck Carotid Arteries- Normal color. Moisture- Normal Moisture. No carotid bruits. No JVD.  Chest and Lung Exam Auscultation: Breath Sounds:-Normal.  Cardiovascular Auscultation:Rythm- Regular. Murmurs & Other Heart Sounds:Auscultation of the heart reveals- No Murmurs.  Abdomen Inspection:-Inspeection Normal. Palpation/Percussion:Note:No mass. Palpation and Percussion of the abdomen reveal- Non Tender, Non Distended + BS, no rebound or guarding.  Neurologic Cranial Nerve exam:- CN III-XII intact(No nystagmus), symmetric smile. Strength:- 5/5 equal and symmetric strength both upper and lower extremities.    Left thumb- base of thumb mild skin burn. 2 area about 10 mm wide. One area mild tender. No redness or warmth.     Assessment & Plan:   Patient Instructions  Vitamin D deficiency - vit d weekly 50,000 units for 8 week and vit otc 4000 international  units daily. Try to get sun exposure as best level by history was when you had a lot of sun exposure - VITAMIN D 25 Hydroxy (Vit-D Deficiency, Fractures); Future   Hair loss -possible stress related. Try minoxidil over the counter. If hair loss progresses can try to refer to dermatologist.    Skin burn -rx silvadene to use twice daily. Cover with bandaid at work.  Follow up date to be determined after vit d level in 9 weeks.     Esperanza Richters, PA-C

## 2023-04-28 ENCOUNTER — Encounter: Payer: Self-pay | Admitting: Medical

## 2023-06-01 DIAGNOSIS — K080 Exfoliation of teeth due to systemic causes: Secondary | ICD-10-CM | POA: Diagnosis not present

## 2023-06-17 DIAGNOSIS — L292 Pruritus vulvae: Secondary | ICD-10-CM | POA: Diagnosis not present

## 2023-06-17 DIAGNOSIS — N393 Stress incontinence (female) (male): Secondary | ICD-10-CM | POA: Diagnosis not present

## 2023-06-22 ENCOUNTER — Other Ambulatory Visit: Payer: Self-pay | Admitting: Medical

## 2023-06-28 ENCOUNTER — Other Ambulatory Visit: Payer: Self-pay | Admitting: Medical

## 2023-07-03 ENCOUNTER — Other Ambulatory Visit: Payer: Self-pay | Admitting: Medical

## 2023-08-01 ENCOUNTER — Other Ambulatory Visit (INDEPENDENT_AMBULATORY_CARE_PROVIDER_SITE_OTHER)

## 2023-08-01 DIAGNOSIS — E559 Vitamin D deficiency, unspecified: Secondary | ICD-10-CM

## 2023-08-02 ENCOUNTER — Encounter: Payer: Self-pay | Admitting: Medical

## 2023-08-02 DIAGNOSIS — R350 Frequency of micturition: Secondary | ICD-10-CM | POA: Diagnosis not present

## 2023-08-02 DIAGNOSIS — R3915 Urgency of urination: Secondary | ICD-10-CM | POA: Diagnosis not present

## 2023-08-02 LAB — VITAMIN D 25 HYDROXY (VIT D DEFICIENCY, FRACTURES): VITD: 18.8 ng/mL — ABNORMAL LOW (ref 30.00–100.00)

## 2023-08-03 ENCOUNTER — Encounter: Payer: Self-pay | Admitting: Medical

## 2023-08-03 ENCOUNTER — Telehealth: Payer: Self-pay | Admitting: Medical

## 2023-08-03 ENCOUNTER — Ambulatory Visit: Admitting: Medical

## 2023-08-03 NOTE — Telephone Encounter (Signed)
 Pt is transferring care from [facility] to [facility] per Karn Pickler. Pt waited to see pcp today for over a hour to get her medication refilled. Pt states that she has had to wait multiple time to see pcp for long increments of time. Pt needs to have medications called into get her to her TOC appt.

## 2023-08-11 ENCOUNTER — Other Ambulatory Visit: Payer: Self-pay | Admitting: Medical

## 2023-08-22 DIAGNOSIS — E1042 Type 1 diabetes mellitus with diabetic polyneuropathy: Secondary | ICD-10-CM | POA: Diagnosis not present

## 2023-08-22 DIAGNOSIS — Z9641 Presence of insulin pump (external) (internal): Secondary | ICD-10-CM | POA: Diagnosis not present

## 2023-08-22 DIAGNOSIS — Z978 Presence of other specified devices: Secondary | ICD-10-CM | POA: Diagnosis not present

## 2023-08-22 LAB — HEMOGLOBIN A1C: Hemoglobin A1C: 7.5

## 2023-08-23 ENCOUNTER — Encounter: Payer: Self-pay | Admitting: Physician Assistant

## 2023-08-23 ENCOUNTER — Ambulatory Visit (INDEPENDENT_AMBULATORY_CARE_PROVIDER_SITE_OTHER): Admitting: Physician Assistant

## 2023-08-23 VITALS — BP 122/79 | HR 85 | Ht <= 58 in | Wt 122.4 lb

## 2023-08-23 DIAGNOSIS — Z1211 Encounter for screening for malignant neoplasm of colon: Secondary | ICD-10-CM

## 2023-08-23 DIAGNOSIS — E0843 Diabetes mellitus due to underlying condition with diabetic autonomic (poly)neuropathy: Secondary | ICD-10-CM

## 2023-08-23 DIAGNOSIS — E559 Vitamin D deficiency, unspecified: Secondary | ICD-10-CM | POA: Diagnosis not present

## 2023-08-23 DIAGNOSIS — K909 Intestinal malabsorption, unspecified: Secondary | ICD-10-CM

## 2023-08-23 DIAGNOSIS — E109 Type 1 diabetes mellitus without complications: Secondary | ICD-10-CM | POA: Diagnosis not present

## 2023-08-23 LAB — BASIC METABOLIC PANEL WITH GFR
BUN: 14 mg/dL (ref 6–23)
CO2: 28 meq/L (ref 19–32)
Calcium: 8.8 mg/dL (ref 8.4–10.5)
Chloride: 102 meq/L (ref 96–112)
Creatinine, Ser: 0.65 mg/dL (ref 0.40–1.20)
GFR: 105 mL/min (ref >60.00)
Glucose, Bld: 181 mg/dL — ABNORMAL HIGH (ref 70–99)
Potassium: 4.2 meq/L (ref 3.5–5.1)
Sodium: 138 meq/L (ref 135–145)

## 2023-08-23 LAB — MICROALBUMIN / CREATININE URINE RATIO
Creatinine,U: 116.3 mg/dL
Microalb Creat Ratio: UNDETERMINED mg/g (ref 0.0–30.0)
Microalb, Ur: <0.7 mg/dL

## 2023-08-23 NOTE — Assessment & Plan Note (Signed)
 Last A1c 7.5%.  Managed by endocrinology Ordering bmp and uacr today.  On statin.

## 2023-08-23 NOTE — Assessment & Plan Note (Signed)
 Currently on gabapentin 600 mg TID with diminishing efficacy. Previous Lyrica trials resulted in intolerable side effects. - Refer to neurology for further evaluation and management

## 2023-08-23 NOTE — Assessment & Plan Note (Signed)
 Chronic low vitamin D levels since at least 2018, consistently under 20 despite high-dose supplementation and sun exposure. Possible malabsorption issue suspected.   - Refer to gastroenterology for evaluation of potential malabsorption

## 2023-08-23 NOTE — Progress Notes (Signed)
 Established patient visit   Patient: Lisa Levine   DOB: 1976/01/13   48 y.o. Female  MRN: [id number] Visit Date: 08/23/2023  Today's healthcare provider: Alfredia Ferguson, PA-C  Cc. Transfer of care  Subjective    Discussed the use of AI scribe software for clinical note transcription with the patient, who gave verbal consent to proceed.  History of Present Illness   The patient, with a history of type 1 diabetes, is transferring care due to scheduling conflicts. The patient regularly sees an endocrinologist and has recently had an A1c test. The patient's main concerns are persistent low vitamin D levels and worsening neuropathy symptoms.  The patient has been taking prescribed vitamin D supplements for a long time, but the levels remain consistently low. The patient also takes over-the-counter vitamin D supplements. Despite exposure to sunlight during a recent trip to [place], the patient's vitamin D levels did not increase. The patient reports feeling tired due to the low vitamin D levels.  The patient's neuropathy symptoms have been worsening despite taking gabapentin. The patient reports a burning sensation and needle-like pain in the feet, especially at night. The patient has tried Lyrica in the past, but it caused side effects such as sweaty palms and shakiness. The patient is seeking alternative treatments for neuropathy.       Medications: Outpatient Medications Prior to Visit  Medication Sig   atorvastatin (LIPITOR) 10 MG tablet Take 1 tablet (10 mg total) by mouth daily.   cetirizine (ZYRTEC) 10 MG tablet Take 10 mg by mouth daily as needed for allergies.   chlorhexidine (PERIDEX) 0.12 % solution Use as directed 5 mLs in the mouth or throat 2 (two) times daily.   diclofenac Sodium (VOLTAREN) 1 % GEL APPLY 4 GRAMS TOPICALLY TO THE AFFECTED AREA FOUR TIMES DAILY   gabapentin (NEURONTIN) 600 MG tablet Take 600 mg by mouth 4 (four) times daily.    ibuprofen (ADVIL) 600 MG tablet  Take 1 tablet (600 mg total) by mouth every 8 (eight) hours as needed.   insulin aspart (NOVOLOG) 100 UNIT/ML injection Use as needed in insulin pump.  MDD: 66 units/day   insulin NPH Human (HUMULIN N,NOVOLIN N) 100 UNIT/ML injection Inject 6-45 Units into the skin See admin instructions. Used withInsulin pump 35-45 units per day per basal rate...Marland KitchenMarland KitchenInject 6-12 units subcutaneously per sliding scale as needed for blood sugar correction with insulin pump   levocetirizine (XYZAL) 5 MG tablet Take 1 tablet (5 mg total) by mouth every evening.   meloxicam (MOBIC) 7.5 MG tablet TAKE 1 TABLET(7.5 MG) BY MOUTH DAILY WITH FOOD   methocarbamol (ROBAXIN) 750 MG tablet TAKE 1 TABLET(750 MG) BY MOUTH EVERY 8 HOURS AS NEEDED FOR MUSCLE SPASMS   methylPREDNISolone (MEDROL) 4 MG tablet 3 tab po day 1, 2 tab po day 2 and 1 tab po day 3.   Vitamin D, Ergocalciferol, (DRISDOL) 1.25 MG (50000 UNIT) CAPS capsule Take 1 capsule (50,000 Units total) by mouth every 7 (seven) days. Labs for further refills   [DISCONTINUED] acetaminophen (TYLENOL) 500 MG tablet Take 500 mg by mouth 3 (three) times daily as needed for mild pain.   [DISCONTINUED] fluconazole (DIFLUCAN) 150 MG tablet Take 150 mg by mouth once.   [DISCONTINUED] Insulin Human (INSULIN PUMP) SOLN Inject into the skin continuous. Use with Humalog - basal rate   [DISCONTINUED] silver sulfADIAZINE (SILVADENE) 1 % cream Apply 1 Application topically 2 (two) times daily.   [DISCONTINUED] traMADol (ULTRAM) 50 MG  tablet Take 1 tablet (50 mg total) by mouth every 6 (six) hours as needed.   [DISCONTINUED] triamcinolone ointment (KENALOG) 0.5 % Apply 1 Application topically 2 (two) times daily. For moderate to severe eczema.  Do not use for more than 1 week at a time.   [DISCONTINUED] Vitamin D, Ergocalciferol, (DRISDOL) 1.25 MG (50000 UNIT) CAPS capsule Take 1 capsule (50,000 Units total) by mouth every 7 (seven) days.   No facility-administered medications prior to visit.     Review of Systems  Constitutional:  Negative for fatigue and fever.  Respiratory:  Negative for cough and shortness of breath.   Cardiovascular:  Negative for chest pain and leg swelling.  Gastrointestinal:  Negative for abdominal pain.  Neurological:  Positive for numbness. Negative for dizziness and headaches.       Foot pain       Objective    BP 122/79   Pulse 85   Ht 4\' 10"  (1.473 m)   Wt 122 lb 6.4 oz (55.5 kg)   LMP 06/02/2010 (Approximate) Comment: pt states she has not had a period in 12-13 years  BMI 25.58 kg/m    Physical Exam Constitutional:      General: She is awake.     Appearance: She is well-developed.  HENT:     Head: Normocephalic.  Eyes:     Conjunctiva/sclera: Conjunctivae normal.  Cardiovascular:     Rate and Rhythm: Normal rate and regular rhythm.     Heart sounds: Normal heart sounds.  Pulmonary:     Effort: Pulmonary effort is normal.     Breath sounds: Normal breath sounds.  Skin:    General: Skin is warm.  Neurological:     Mental Status: She is alert and oriented to person, place, and time.  Psychiatric:        Attention and Perception: Attention normal.        Mood and Affect: Mood normal.        Speech: Speech normal.        Behavior: Behavior is cooperative.     Results for orders placed or performed in visit on 08/23/23  Hemoglobin A1c  Result Value Ref Range   Hemoglobin A1C 7.5   HM PAP SMEAR  Result Value Ref Range   HM Pap smear NIL HPV neg     Assessment & Plan    Type 1 diabetes mellitus without complication (HCC) Assessment & Plan: Last A1c 7.5%.  Managed by endocrinology Ordering bmp and uacr today.  On statin.  Orders: -     Basic metabolic panel with GFR -     Microalbumin / creatinine urine ratio  Vitamin D deficiency Assessment & Plan: Chronic low vitamin D levels since at least 2018, consistently under 20 despite high-dose supplementation and sun exposure. Possible malabsorption issue suspected.    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 Previous Lyrica trials resulted in intolerable side effects. - Refer to neurology for further evaluation and management  Orders: -     Ambulatory referral to Neurology   Return in about 6 months (around 02/22/2024) for chronic conditions.  Alfredia Ferguson, PA-C  [facility] [phone] (phone) [phone] (fax)  [facility]

## 2023-09-13 DIAGNOSIS — R350 Frequency of micturition: Secondary | ICD-10-CM | POA: Diagnosis not present

## 2023-09-19 DIAGNOSIS — H524 Presbyopia: Secondary | ICD-10-CM | POA: Diagnosis not present

## 2023-10-18 NOTE — Progress Notes (Deleted)
 10/18/2023 Lisa Levine [id number] 1975-09-09  Referring provider: Trenton Frock, PA-C Primary GI doctor: Dr. Dominic Friendly  ASSESSMENT AND PLAN:   Insulin  dependent diabetic with neuropathy  Patient Care Team: Ginger Lai as PCP - General (Physician Assistant)  HISTORY OF PRESENT ILLNESS: 48 y.o. [place] female with a past medical history listed below presents for evaluation of " intestinal malabsorption".   Remotely seen by Alfonza Iles 2018 for weight loss and epigastric discomfort.  07/14/2016 normal gastric emptying study. H pylori stool was never completed.  *** Discussed the use of AI scribe software for clinical note transcription with the patient, who gave verbal consent to proceed.  History of Present Illness            She  reports that she has never smoked. She has never used smokeless tobacco. She reports that she does not drink alcohol and does not use drugs.  RELEVANT GI HISTORY, IMAGING AND LABS: Results          CBC    Component Value Date/Time   WBC 5.1 01/18/2023 1112   RBC 4.34 01/18/2023 1112   HGB 13.4 01/18/2023 1112   HCT 41.3 01/18/2023 1112   PLT 397.0 01/18/2023 1112   MCV 95.1 01/18/2023 1112   MCH 31.8 12/15/2021 1905   MCHC 32.5 01/18/2023 1112   RDW 12.9 01/18/2023 1112   LYMPHSABS 1.0 01/18/2023 1112   MONOABS 0.4 01/18/2023 1112   EOSABS 0.1 01/18/2023 1112   BASOSABS 0.0 01/18/2023 1112   Recent Labs    01/18/23 1112  HGB 13.4    CMP     Component Value Date/Time   NA 138 08/23/2023 0938   NA 137 10/23/2020 1532   K 4.2 08/23/2023 0938   CL 102 08/23/2023 0938   CO2 28 08/23/2023 0938   GLUCOSE 181 (H) 08/23/2023 0938   BUN 14 08/23/2023 0938   BUN 9 10/23/2020 1532   CREATININE 0.65 08/23/2023 0938   CALCIUM  8.8 08/23/2023 0938   PROT 7.8 12/15/2021 1905   ALBUMIN 4.0 12/15/2021 1905   AST 17 12/15/2021 1905   ALT 18 12/15/2021 1905   ALKPHOS 49 12/15/2021 1905   BILITOT 0.7 12/15/2021 1905    GFRNONAA >60 12/15/2021 1905   GFRAA >60 10/25/2017 1629      Latest Ref Rng & Units 12/15/2021    7:05 PM 09/30/2021    3:14 PM 04/23/2021   12:10 PM  Hepatic Function  Total Protein 6.5 - 8.1 g/dL 7.8  7.9  7.7   Albumin 3.5 - 5.0 g/dL 4.0  4.5  4.2   AST 15 - 41 U/L 17  24  17    ALT 0 - 44 U/L 18  11  10    Alk Phosphatase 38 - 126 U/L 49  48  50   Total Bilirubin 0.3 - 1.2 mg/dL 0.7  0.6  0.6       Current Medications:   Current Outpatient Medications (Endocrine & Metabolic):    insulin  aspart (NOVOLOG) 100 UNIT/ML injection, Use as needed in insulin  pump.  MDD: 66 units/day   insulin  NPH Human (HUMULIN N,NOVOLIN N) 100 UNIT/ML injection, Inject 6-45 Units into the skin See admin instructions. Used withInsulin pump 35-45 units per day per basal rate...Aaron AasAaron AasInject 6-12 units subcutaneously per sliding scale as needed for blood sugar correction with insulin  pump   methylPREDNISolone  (MEDROL ) 4 MG tablet, 3 tab po day 1, 2 tab po day 2 and 1 tab po day 3.  Current Outpatient Medications (Cardiovascular):    atorvastatin  (LIPITOR) 10 MG tablet, Take 1 tablet (10 mg total) by mouth daily.  Current Outpatient Medications (Respiratory):    cetirizine (ZYRTEC) 10 MG tablet, Take 10 mg by mouth daily as needed for allergies.   levocetirizine (XYZAL ) 5 MG tablet, Take 1 tablet (5 mg total) by mouth every evening.  Current Outpatient Medications (Analgesics):    ibuprofen  (ADVIL ) 600 MG tablet, Take 1 tablet (600 mg total) by mouth every 8 (eight) hours as needed.   meloxicam  (MOBIC ) 7.5 MG tablet, TAKE 1 TABLET(7.5 MG) BY MOUTH DAILY WITH FOOD   Current Outpatient Medications (Other):    chlorhexidine (PERIDEX) 0.12 % solution, Use as directed 5 mLs in the mouth or throat 2 (two) times daily.   diclofenac  Sodium (VOLTAREN ) 1 % GEL, APPLY 4 GRAMS TOPICALLY TO THE AFFECTED AREA FOUR TIMES DAILY   gabapentin  (NEURONTIN ) 600 MG tablet, Take 600 mg by mouth 4 (four) times daily.     methocarbamol  (ROBAXIN ) 750 MG tablet, TAKE 1 TABLET(750 MG) BY MOUTH EVERY 8 HOURS AS NEEDED FOR MUSCLE SPASMS   Vitamin D , Ergocalciferol , (DRISDOL ) 1.25 MG (50000 UNIT) CAPS capsule, Take 1 capsule (50,000 Units total) by mouth every 7 (seven) days. Labs for further refills  Medical History:  Past Medical History:  Diagnosis Date   Allergy    Arthritis    DVT (deep venous thrombosis) (HCC)    "often on my LLE" (06/04/2016)   Hyperlipidemia    Hypertension    Migraine    "q couple months" (06/04/2016)   Peripheral neuropathy    Thyroid  disease    Type 1 diabetes mellitus (HCC)    Vitamin D  deficiency    Allergies: No Known Allergies   Surgical History:  She  has a past surgical history that includes insulin  pump (2005-present). Family History:  Her family history includes Breast cancer in her mother; Colon cancer in her paternal grandfather; Hyperlipidemia in her father; Hypertension in her father; Liver disease in her paternal uncle; Lung cancer in her paternal uncle.  REVIEW OF SYSTEMS  : All other systems reviewed and negative except where noted in the History of Present Illness.  PHYSICAL EXAM: LMP 06/02/2010 (Approximate) Comment: pt states she has not had a period in 12-13 years Physical Exam          Edmonia Gottron, PA-C 12:02 PM

## 2023-10-19 ENCOUNTER — Ambulatory Visit: Admitting: Physician Assistant

## 2023-10-25 ENCOUNTER — Encounter: Payer: Self-pay | Admitting: Student in an Organized Health Care Education/Training Program

## 2023-10-25 ENCOUNTER — Ambulatory Visit
Attending: Student in an Organized Health Care Education/Training Program | Admitting: Student in an Organized Health Care Education/Training Program

## 2023-10-25 VITALS — BP 124/78 | HR 88 | Temp 98.0°F | Resp 16 | Ht <= 58 in | Wt 120.0 lb

## 2023-10-25 DIAGNOSIS — M542 Cervicalgia: Secondary | ICD-10-CM | POA: Insufficient documentation

## 2023-10-25 DIAGNOSIS — M5412 Radiculopathy, cervical region: Secondary | ICD-10-CM | POA: Insufficient documentation

## 2023-10-25 DIAGNOSIS — M25512 Pain in left shoulder: Secondary | ICD-10-CM | POA: Insufficient documentation

## 2023-10-25 DIAGNOSIS — G5682 Other specified mononeuropathies of left upper limb: Secondary | ICD-10-CM | POA: Diagnosis not present

## 2023-10-25 DIAGNOSIS — K080 Exfoliation of teeth due to systemic causes: Secondary | ICD-10-CM | POA: Diagnosis not present

## 2023-10-25 DIAGNOSIS — M51362 Other intervertebral disc degeneration, lumbar region with discogenic back pain and lower extremity pain: Secondary | ICD-10-CM | POA: Diagnosis not present

## 2023-10-25 DIAGNOSIS — M7502 Adhesive capsulitis of left shoulder: Secondary | ICD-10-CM | POA: Diagnosis not present

## 2023-10-25 DIAGNOSIS — G8929 Other chronic pain: Secondary | ICD-10-CM | POA: Diagnosis not present

## 2023-10-25 NOTE — Patient Instructions (Signed)

## 2023-10-25 NOTE — Progress Notes (Signed)
 PROVIDER NOTE: Interpretation of information contained herein should be left to medically-trained personnel. Specific patient instructions are provided elsewhere under "Patient Instructions" section of medical record. This document was created in part using AI and STT-dictation technology, any transcriptional errors that may result from this process are unintentional.  Patient: Lisa Levine  Service: E/M   PCP: Trenton Frock, PA-C  DOB: 04-Dec-1975  DOS: 10/25/2023  Provider: Cephus Collin, MD  MRN: [id number]  Delivery: Face-to-face  Specialty: Interventional Pain Management  Type: Established Patient  Setting: Ambulatory outpatient facility  Specialty designation: 09  Referring Prov.: Trenton Frock, PA-C  Location: Outpatient office facility       History of present illness (HPI) Lisa Levine, a 48 y.o. 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 Lisa Levine primary complain today is Shoulder Pain (right)  Pertinent problems: Lisa Levine does not have any pertinent problems on file.  Pain Assessment: Severity of Chronic pain is reported as a 7 /10. Location: Shoulder Right/radiates down right arm to elbow. Onset: More than a month ago. Quality: Sharp, Tingling, Throbbing. Timing: Intermittent. Modifying factor(s): raising arm, meds, heat. Vitals:  height is 4\' 10"  (1.473 m) and weight is 120 lb (54.4 kg). Her temperature is 98 F (36.7 C). Her blood pressure is 124/78 and her pulse is 88. Her respiration is 16 and oxygen saturation is 100%.  BMI: Estimated body mass index is 25.08 kg/m as calculated from the following:   Height as of this encounter: 4\' 10"  (1.473 m).   Weight as of this encounter: 120 lb (54.4 kg).  Last encounter: 12/08/2022. Last procedure: Visit date not found.  Reason for encounter:   History of Present Illness   Lisa Levine is a 48 year old female with a history of left rotator cuff arthropathy, left suprascapular nerve entrapment  who presents with recurrence of left shoulder pain.  She has been experiencing a recurrence of left shoulder pain, primarily when sitting at her desk, which has been worsening over the past month. She takes meloxicam  7.5 mg in the morning and 7.5 mg at night to manage the pain and aid sleep. The pain is reminiscent of an episode she experienced nearly a year ago, for which she underwent a suprascapular nerve ablation without sedation.  This was done with me 10/27/2022 and she experienced approximately 80% pain relief for approximately 1 year.  She reports issues with her left leg, specifically difficulty crossing it due to pain, which is located mostly in her hips and along the side. This has been a persistent issue but has become more problematic recently, especially during long work shifts. She has a history of sciatic nerve problems from about a year ago. She works at [employer], where she sits for extended periods, and uses a step to elevate her legs to manage neuropathy symptoms.  She has been diagnosed with neuropathy and is currently taking gabapentin  2800 mg daily. She feels that her body is becoming resistant to the medication. The neuropathy affects her ability to move her legs comfortably, particularly on the left side, causing significant discomfort.  She mentions a history radiating neck pain that she mentions, which she associates with pain radiating down her arm, similar to 'tennis elbow' on her right hand. The last imaging of her neck was over a year ago.        ROS  Constitutional: Denies any fever or chills Gastrointestinal: No reported hemesis, hematochezia, vomiting,  or acute GI distress Musculoskeletal: Left shoulder pain, radiating right arm pain, low back pain with radiation into left leg Neurological: No reported episodes of acute onset apraxia, aphasia, dysarthria, agnosia, amnesia, paralysis, loss of coordination, or loss of consciousness  Medication Review  Vitamin D   (Ergocalciferol ), atorvastatin , cetirizine, chlorhexidine, diclofenac  Sodium, gabapentin , ibuprofen , insulin  NPH Human, insulin  aspart, levocetirizine, meloxicam , methocarbamol , and methylPREDNISolone   History Review  Allergy: Ms. Rosen has no known allergies. Drug: Ms. Bucher  reports no history of drug use. Alcohol:  reports no history of alcohol use. Tobacco:  reports that she has never smoked. She has never used smokeless tobacco. Social: Ms. Howland  reports that she has never smoked. She has never used smokeless tobacco. She reports that she does not drink alcohol and does not use drugs. Medical:  has a past medical history of Allergy, Arthritis, DVT (deep venous thrombosis) (HCC), Hyperlipidemia, Hypertension, Migraine, Peripheral neuropathy, Thyroid  disease, Type 1 diabetes mellitus (HCC), and Vitamin D  deficiency. Surgical: Ms. Mersch  has a past surgical history that includes insulin  pump (2005-present). Family: family history includes Breast cancer in her mother; Colon cancer in her paternal grandfather; Hyperlipidemia in her father; Hypertension in her father; Liver disease in her paternal uncle; Lung cancer in her paternal uncle.  Laboratory Chemistry Profile   Renal Lab Results  Component Value Date   BUN 14 08/23/2023   CREATININE 0.65 08/23/2023   BCR 15 10/23/2020   GFR 105.00 08/23/2023   GFRAA >60 10/25/2017   GFRNONAA >60 12/15/2021    Hepatic Lab Results  Component Value Date   AST 17 12/15/2021   ALT 18 12/15/2021   ALBUMIN 4.0 12/15/2021   ALKPHOS 49 12/15/2021   LIPASE 31 10/25/2017    Electrolytes Lab Results  Component Value Date   NA 138 08/23/2023   K 4.2 08/23/2023   CL 102 08/23/2023   CALCIUM  8.8 08/23/2023   MG 1.9 10/27/2016    Bone Lab Results  Component Value Date   VD25OH 18.80 (L) 08/01/2023   VD125OH2TOT 51 04/23/2021   [id number] 51 04/23/2021   NW2956OZ3 <8 04/23/2021    Inflammation (CRP: Acute Phase) (ESR: Chronic Phase) Lab Results   Component Value Date   CRP 0.7 10/16/2015   ESRSEDRATE 13 10/16/2015         Note: Above Lab results reviewed.     Physical Exam  General appearance: Well nourished, well developed, and well hydrated. In no apparent acute distress Mental status: Alert, oriented x 3 (person, place, & time)       Respiratory: No evidence of acute respiratory distress Eyes: PERLA Vitals: BP 124/78   Pulse 88   Temp 98 F (36.7 C)   Resp 16   Ht 4\' 10"  (1.473 m)   Wt 120 lb (54.4 kg)   LMP 06/02/2010 (Approximate) Comment: pt states she has not had a period in 12-13 years  SpO2 100%   BMI 25.08 kg/m  BMI: Estimated body mass index is 25.08 kg/m as calculated from the following:   Height as of this encounter: 4\' 10"  (1.473 m).   Weight as of this encounter: 120 lb (54.4 kg). Ideal: Patient must be at least 60 in tall to calculate ideal body weight  Cervical Spine Area Exam  Skin & Axial Inspection: No masses, redness, edema, swelling, or associated skin lesions Alignment: Symmetrical Functional ROM: Unrestricted ROM      Stability: No instability detected Muscle Tone/Strength: Functionally intact. No obvious neuro-muscular anomalies detected. Sensory (Neurological): Dermatomal  pain pattern Palpation: No palpable anomalies             Upper Extremity (UE) Exam    Side: Right upper extremity  Side: Left upper extremity  Skin & Extremity Inspection: Skin color, temperature, and hair growth are WNL. No peripheral edema or cyanosis. No masses, redness, swelling, asymmetry, or associated skin lesions. No contractures.  Skin & Extremity Inspection: Skin color, temperature, and hair growth are WNL. No peripheral edema or cyanosis. No masses, redness, swelling, asymmetry, or associated skin lesions. No contractures.  Functional ROM: Unrestricted ROM          Functional ROM: Pain restricted ROM for shoulder  Muscle Tone/Strength: Functionally intact. No obvious neuro-muscular anomalies detected.   Muscle Tone/Strength: Functionally intact. No obvious neuro-muscular anomalies detected.  Sensory (Neurological): Unimpaired          Sensory (Neurological): Neurogenic pain pattern          Palpation: No palpable anomalies              Palpation: No palpable anomalies              Provocative Test(s):  Phalen's test: deferred Tinel's test: deferred Apley's scratch test (touch opposite shoulder):  Action 1 (Across chest): deferred Action 2 (Overhead): deferred Action 3 (LB reach): deferred   Provocative Test(s):  Phalen's test: deferred Tinel's test: deferred Apley's scratch test (touch opposite shoulder):  Action 1 (Across chest): Decreased ROM Action 2 (Overhead): Decreased ROM Action 3 (LB reach): Decreased ROM    Lumbar Spine Area Exam  Skin & Axial Inspection: No masses, redness, or swelling Alignment: Symmetrical Functional ROM: Unrestricted ROM       Stability: No instability detected Muscle Tone/Strength: Functionally intact. No obvious neuro-muscular anomalies detected. Sensory (Neurological): Neurogenic pain pattern  Gait & Posture Assessment  Ambulation: Unassisted Gait: Relatively normal for age and body habitus Posture: WNL  Lower Extremity Exam    Side: Right lower extremity  Side: Left lower extremity  Stability: No instability observed          Stability: No instability observed          Skin & Extremity Inspection: Skin color, temperature, and hair growth are WNL. No peripheral edema or cyanosis. No masses, redness, swelling, asymmetry, or associated skin lesions. No contractures.  Skin & Extremity Inspection: Skin color, temperature, and hair growth are WNL. No peripheral edema or cyanosis. No masses, redness, swelling, asymmetry, or associated skin lesions. No contractures.  Functional ROM: Unrestricted ROM                  Functional ROM: Pain restricted ROM for hip and knee joints          Muscle Tone/Strength: Functionally intact. No obvious neuro-muscular  anomalies detected.  Muscle Tone/Strength: Functionally intact. No obvious neuro-muscular anomalies detected.  Sensory (Neurological): Unimpaired        Sensory (Neurological): Dermatomal pain pattern        DTR: Patellar: deferred today Achilles: deferred today Plantar: deferred today  DTR: Patellar: deferred today Achilles: deferred today Plantar: deferred today  Palpation: No palpable anomalies  Palpation: No palpable anomalies    Assessment   Diagnosis Status  1. Suprascapular nerve entrapment, left   2. Chronic left shoulder pain   3. Adhesive capsulitis of left shoulder   4. Degeneration of intervertebral disc of lumbar region with discogenic back pain and lower extremity pain   5. Cervical radicular pain   6. Cervicalgia  Having a Flare-up Having a Flare-up Having a Flare-up   Updated Problems: Problem  Degeneration of Intervertebral Disc of Lumbar Region With Discogenic Back Pain and Lower Extremity Pain    Plan of Care  Problem-specific:  Assessment and Plan    Left shoulder pain   Recurrent left shoulder pain, likely due to suprascapular nerve involvement, has worsened over the past month, especially when sitting at a desk. A previous suprascapular nerve ablation was effective for nearly a year and well-tolerated without sedation. Schedule a repeat left shoulder suprascapular nerve radiofrequency ablation without sedation later this month.  Cervical radiculopathy   Possible cervical radiculopathy with arm pain radiating from potential nerve compression in the neck. Previous cervical epidurals were ineffective. The last cervical spine imaging was over a year ago. Order an MRI of the cervical spine to evaluate for nerve compression or other abnormalities.  Sciatica   Chronic left leg pain, exacerbated by prolonged sitting and crossing legs, is localized to the hip and buttock area, suggestive of sciatica possibly related to spinal issues. No previous spinal imaging.  Order dynamic flexion-extension x-rays of the spine to assess for movement and potential causes of sciatica. Consider an MRI of the spine if x-ray findings indicate further investigation is needed. Consider an epidural if symptoms persist or worsen.  Neuropathy   Long-standing neuropathy, diagnosed 22 years ago, is currently managed with 2800 mg of gabapentin . Reports possible resistance to gabapentin .  Consider Lyrica in the future      Lisa Levine has a current medication list which includes the following long-term medication(s): atorvastatin , cetirizine, gabapentin , insulin  aspart, insulin  nph human, and levocetirizine.  Pharmacotherapy (Medications Ordered): No orders of the defined types were placed in this encounter.  Orders:  Orders Placed This Encounter  Procedures   Radiofrequency Shoulder Joint    For shoulder pain.    Standing Status:   Future    Expiration Date:   01/25/2024    Scheduling Instructions:     Procedure: Suprascapular Nerve Radiofrequency Ablation     Laterality:LEFT     Level(s): Suprascapular nerve     Sedation: without     Scheduling Timeframe: As soon as pre-approved    Where will this procedure be performed?:   [facility]   DG Lumbar Spine Complete W/Bend    Patient presents with axial pain with possible radicular component. Please assist us  in identifying specific level(s) and laterality of any additional findings such as: 1. Facet (Zygapophyseal) joint DJD (Hypertrophy, space narrowing, subchondral sclerosis, and/or osteophyte formation) 2. DDD and/or IVDD (Loss of disc height, desiccation, gas patterns, osteophytes, endplate sclerosis, or "Black disc disease") 3. Pars defects 4. Spondylolisthesis, spondylosis, and/or spondyloarthropathies (include Degree/Grade of displacement in mm) (stability) 5. Vertebral body Fractures (acute/chronic) (state percentage of collapse) 6. Demineralization (osteopenia/osteoporotic) 7. Bone pathology 8.  Foraminal narrowing  9. Surgical changes    Standing Status:   Future    Expiration Date:   01/25/2024    Scheduling Instructions:     Please make sure that the patient understands that this needs to be done as soon as possible. Never have the patient do the imaging "just before the next appointment". Inform patient that having the imaging done within the [facility] will expedite the availability of the results and will provide      imaging availability to the requesting physician. In addition inform the patient that the imaging order has an expiration date and will not be renewed if not done  within the active period.    Reason for Exam (SYMPTOM  OR DIAGNOSIS REQUIRED):   Low back pain    Is patient pregnant?:   No    Preferred imaging location?:   [facility]    Call Results- Best Contact Number?:   [phone] [place] [facility]    Radiology Contrast Protocol - do NOT remove file path:   \\[email or web address]    Release to patient:   Immediate   MR CERVICAL SPINE WO CONTRAST    Patient presents with axial pain with possible radicular component. Please assist us  in identifying specific level(s) and laterality of any additional findings such as: 1. Facet (Zygapophyseal) joint DJD (Hypertrophy, space narrowing, subchondral sclerosis, and/or osteophyte formation) 2. DDD and/or IVDD (Loss of disc height, desiccation, gas patterns, osteophytes, endplate sclerosis, or "Black disc disease") 3. Pars defects 4. Spondylolisthesis, spondylosis, and/or spondyloarthropathies (include Degree/Grade of displacement in mm) (stability) 5. Vertebral body Fractures (acute/chronic) (state percentage of collapse) 6. Demineralization (osteopenia/osteoporotic) 7. Bone pathology 8. Foraminal narrowing  9. Surgical changes 10. Central, Lateral Recess, and/or Foraminal Stenosis (include AP diameter of stenosis in mm) 11.  Surgical changes (hardware type, status, and presence of fibrosis) 12. Modic Type Changes (MRI only) 13. IVDD (Disc bulge, protrusion, herniation, extrusion) (Level, laterality, extent)    Standing Status:   Future    Expiration Date:   01/25/2024    Scheduling Instructions:     Please make sure that the patient understands that this needs to be done as soon as possible. Never have the patient do the imaging "just before the next appointment". Inform patient that having the imaging done within the [facility] will expedite the availability of the results and will provide      imaging availability to the requesting physician. In addition inform the patient that the imaging order has an expiration date and will not be renewed if not done within the active period.    What is the patient's sedation requirement?:   No Sedation    Does the patient have a pacemaker or implanted devices?:   No    Preferred imaging location?:   [facility] (table limit-350lbs)    Call Results- Best Contact Number?:   [phone] [place] [facility]    Radiology Contrast Protocol - do NOT remove file path:   \\[email or web address]     Left SSNB 07/12/22, pRFA 10/27/22     Return in about 20 days (around 11/14/2023) for Left shoulder SSN RFA , in clinic [facility].    Recent Visits No visits were found meeting these conditions. Showing recent visits within past 90 days and meeting all other requirements Today's Visits Date Type Provider Dept  10/25/23 Office Visit Cephus Collin, MD [facility]  Showing today's visits and meeting all other requirements Future Appointments Date Type Provider Dept  11/14/23 Appointment Cephus Collin, MD [facility]  Showing future appointments within next 90 days and meeting all other requirements  I discussed the assessment and treatment plan with the patient. The patient was provided an  opportunity to ask questions and all were answered. The patient agreed with the plan and demonstrated an understanding of the instructions.  Patient advised to call back or seek an in-person evaluation if the symptoms or condition worsens.  Duration of encounter: .  Total time on encounter, as per AMA guidelines included both the face-to-face and non-face-to-face time personally spent by the physician and/or  other qualified health care professional(s) on the day of the encounter (includes time in activities that require the physician or other qualified health care professional and does not include time in activities normally performed by clinical staff). Physician's time may include the following activities when performed: Preparing to see the patient (e.g., pre-charting review of records, searching for previously ordered imaging, lab work, and nerve conduction tests) Review of prior analgesic pharmacotherapies. Reviewing PMP Interpreting ordered tests (e.g., lab work, imaging, nerve conduction tests) Performing post-procedure evaluations, including interpretation of diagnostic procedures Obtaining and/or reviewing separately obtained history Performing a medically appropriate examination and/or evaluation Counseling and educating the patient/family/caregiver Ordering medications, tests, or procedures Referring and communicating with other health care professionals (when not separately reported) Documenting clinical information in the electronic or other health record Independently interpreting results (not separately reported) and communicating results to the patient/ family/caregiver Care coordination (not separately reported)  Note by: Cephus Collin, MD (TTS and AI technology used. I apologize for any typographical errors that were not detected and corrected.) Date: 10/25/2023; Time: 4:14 PM

## 2023-10-25 NOTE — Progress Notes (Signed)
 Safety precautions to be maintained throughout the outpatient stay will include: orient to surroundings, keep bed in low position, maintain call bell within reach at all times, provide assistance with transfer out of bed and ambulation.

## 2023-11-14 ENCOUNTER — Encounter: Payer: Self-pay | Admitting: Student in an Organized Health Care Education/Training Program

## 2023-11-14 ENCOUNTER — Ambulatory Visit: Admitting: Student in an Organized Health Care Education/Training Program

## 2023-11-14 ENCOUNTER — Ambulatory Visit
Admission: RE | Admit: 2023-11-14 | Discharge: 2023-11-14 | Disposition: A | Discharge status: Home / Self Care | Source: Ambulatory Visit | Attending: Student in an Organized Health Care Education/Training Program | Admitting: Student in an Organized Health Care Education/Training Program

## 2023-11-14 VITALS — BP 132/81 | Resp 18

## 2023-11-14 DIAGNOSIS — G5681 Other specified mononeuropathies of right upper limb: Secondary | ICD-10-CM | POA: Diagnosis not present

## 2023-11-14 DIAGNOSIS — G5682 Other specified mononeuropathies of left upper limb: Secondary | ICD-10-CM

## 2023-11-14 DIAGNOSIS — M25511 Pain in right shoulder: Secondary | ICD-10-CM | POA: Insufficient documentation

## 2023-11-14 DIAGNOSIS — G8929 Other chronic pain: Secondary | ICD-10-CM | POA: Insufficient documentation

## 2023-11-14 MED ORDER — IOHEXOL 180 MG/ML  SOLN: 10.0000 mL | Freq: Once | INTRAMUSCULAR | Status: DC

## 2023-11-14 MED ORDER — LIDOCAINE HCL 2 % IJ SOLN
20.0000 mL | Freq: Once | INTRAMUSCULAR | Status: AC
  Administered 2023-11-14: 100 mg
  Filled 2023-11-14: qty 40

## 2023-11-14 MED ORDER — ROPIVACAINE HCL 2 MG/ML IJ SOLN
9.0000 mL | Freq: Once | INTRAMUSCULAR | Status: AC
  Administered 2023-11-14: 9 mL via PERINEURAL
  Filled 2023-11-14: qty 20

## 2023-11-14 MED ORDER — DEXAMETHASONE SODIUM PHOSPHATE 10 MG/ML IJ SOLN
10.0000 mg | Freq: Once | INTRAMUSCULAR | Status: AC
  Administered 2023-11-14: 10 mg
  Filled 2023-11-14: qty 1

## 2023-11-14 NOTE — Patient Instructions (Signed)

## 2023-11-14 NOTE — Progress Notes (Signed)
 Safety precautions to be maintained throughout the outpatient stay will include: orient to surroundings, keep bed in low position, maintain call bell within reach at all times, provide assistance with transfer out of bed and ambulation.

## 2023-11-14 NOTE — Progress Notes (Signed)
 PROVIDER NOTE: Interpretation of information contained herein should be left to medically-trained personnel. Specific patient instructions are provided elsewhere under Patient Instructions section of medical record. This document was created in part using STT-dictation technology, any transcriptional errors that may result from this process are unintentional.  Patient: Lisa Levine Type: Established DOB: 1975/09/14 MRN: [id number] PCP: Cyndi Shaver, PA-C  Service: Procedure DOS: 11/14/2023 Setting: Ambulatory Location: Ambulatory outpatient facility Delivery: Face-to-face Provider: Wallie Sherry, MD Specialty: Interventional Pain Management Specialty designation: 09 Location: Outpatient facility Ref. Prov.: Cyndi Shaver, PA-C       Interventional Therapy   Procedure: Suprascapular nerve block (SSNB) #1  Laterality:  Right  Level: Superior to scapular spine, lateral to supraspinatus fossa (Suprascapular notch).  Imaging: Fluoroscopic guidance         Anesthesia: Local anesthesia (1-2% Lidocaine ) Sedation: No Sedation                       DOS: 11/14/2023  Performed by: Wallie Sherry, MD  Purpose: Diagnostic/Therapeutic Indications: Shoulder pain, severe enough to impact quality of life and/or function. 1. Suprascapular nerve entrapment, right   2. Chronic right shoulder pain    NAS-11 score:   Pre-procedure: 10-Worst pain ever/10   Post-procedure: 10-Worst pain ever/10     Target: Suprascapular nerve Location: midway between the medial border of the scapula and the acromion as it runs through the suprascapular notch. Region: Suprascapular, posterior shoulder  Approach: Percutaneous  Neuroanatomy: The suprascapular nerve is the lateral branch of the superior trunk of the brachial plexus. It receives nerve fibers that originate in the nerve roots C5 and C6 (and sometimes C4). It is a mixed nerve, meaning that it provides both sensory and motor supply for the suprascapular  region. Function: The main function of this nerve is to provide motor innervation for two muscles, the supraspinatus and infraspinatus muscles. They are part of the rotator cuff muscles. In addition, the suprascapular nerve provides a sensory supply to the joints of the scapula (glenohumeral and acromioclavicular joints). Rationale (medical necessity): procedure needed and proper for the diagnosis and/or treatment of the patient's medical symptoms and needs.  Position / Prep / Materials:  Position: Prone Materials:  Tray: Block Needle(s):  Type: Spinal  Gauge (G): 22  Length: 3.5 in.  Qty: 1 Prep solution: ChloraPrep (2% chlorhexidine gluconate and 70% isopropyl alcohol) Prep Area: Entire posterior shoulder area. From upper spine to shoulder proper (upper arm), and from lateral neck to lower tip of shoulder blade.   H&P (Pre-op Assessment):  Lisa Levine is a 48 y.o. (year old), female patient, seen today for interventional treatment. She  has a past surgical history that includes insulin  pump (2005-present). Lisa Levine has a current medication list which includes the following prescription(s): atorvastatin , cetirizine, chlorhexidine, diclofenac  sodium, gabapentin , ibuprofen , insulin  aspart, insulin  nph human, meloxicam , methocarbamol , vitamin d  (ergocalciferol ), levocetirizine, and methylprednisolone , and the following Facility-Administered Medications: iohexol . Her primarily concern today is the Shoulder Pain (Right )  Initial Vital Signs:  Pulse/HCG Rate:  ECG Heart Rate: 68 Temp:   Resp: 18 BP: 118/80 SpO2: 99 %  BMI: Estimated body mass index is 25.08 kg/m as calculated from the following:   Height as of 10/25/23: 4' 10 (1.473 m).   Weight as of 10/25/23: 120 lb (54.4 kg).  Risk Assessment: Allergies: Reviewed. She has no known allergies.  Allergy Precautions: None required Coagulopathies: Reviewed. None identified.  Blood-thinner therapy: None at this time Active Infection(s):  Reviewed. None  identified. Lisa Levine is afebrile  Site Confirmation: Lisa Levine was asked to confirm the procedure and laterality before marking the site Procedure checklist: Completed Consent: Before the procedure and under the influence of no sedative(s), amnesic(s), or anxiolytics, the patient was informed of the treatment options, risks and possible complications. To fulfill our ethical and legal obligations, as recommended by the American Medical Association's Code of Ethics, I have informed the patient of my clinical impression; the nature and purpose of the treatment or procedure; the risks, benefits, and possible complications of the intervention; the alternatives, including doing nothing; the risk(s) and benefit(s) of the alternative treatment(s) or procedure(s); and the risk(s) and benefit(s) of doing nothing. The patient was provided information about the general risks and possible complications associated with the procedure. These may include, but are not limited to: failure to achieve desired goals, infection, bleeding, organ or nerve damage, allergic reactions, paralysis, and death. In addition, the patient was informed of those risks and complications associated to the procedure, such as failure to decrease pain; infection; bleeding; organ or nerve damage with subsequent damage to sensory, motor, and/or autonomic systems, resulting in permanent pain, numbness, and/or weakness of one or several areas of the body; allergic reactions; (i.e.: anaphylactic reaction); and/or death. Furthermore, the patient was informed of those risks and complications associated with the medications. These include, but are not limited to: allergic reactions (i.e.: anaphylactic or anaphylactoid reaction(s)); adrenal axis suppression; blood sugar elevation that in diabetics may result in ketoacidosis or comma; water retention that in patients with history of congestive heart failure may result in shortness of breath,  pulmonary edema, and decompensation with resultant heart failure; weight gain; swelling or edema; medication-induced neural toxicity; particulate matter embolism and blood vessel occlusion with resultant organ, and/or nervous system infarction; and/or aseptic necrosis of one or more joints. Finally, the patient was informed that Medicine is not an exact science; therefore, there is also the possibility of unforeseen or unpredictable risks and/or possible complications that may result in a catastrophic outcome. The patient indicated having understood very clearly. We have given the patient no guarantees and we have made no promises. Enough time was given to the patient to ask questions, all of which were answered to the patient's satisfaction. Ms. Merritts has indicated that she wanted to continue with the procedure. Attestation: I, the ordering provider, attest that I have discussed with the patient the benefits, risks, side-effects, alternatives, likelihood of achieving goals, and potential problems during recovery for the procedure that I have provided informed consent. Date  Time: 11/14/2023  1:27 PM  Pre-Procedure Preparation:  Monitoring: As per clinic protocol. Respiration, ETCO2, SpO2, BP, heart rate and rhythm monitor placed and checked for adequate function Safety Precautions: Patient was assessed for positional comfort and pressure points before starting the procedure. Time-out: I initiated and conducted the Time-out before starting the procedure, as per protocol. The patient was asked to participate by confirming the accuracy of the Time Out information. Verification of the correct person, site, and procedure were performed and confirmed by me, the nursing staff, and the patient. Time-out conducted as per Joint Commission's Universal Protocol (UP.01.01.01). Time: 1430 Start Time: 1430 hrs.  Description of Procedure:          Procedural Technique Safety Precautions: Aspiration looking for  blood return was conducted prior to all injections. At no point did we inject any substances, as a needle was being advanced. No attempts were made at seeking any paresthesias. Safe injection practices and  needle disposal techniques used. Medications properly checked for expiration dates. SDV (single dose vial) medications used. Description of the Procedure: Protocol guidelines were followed. The patient was placed in position over the procedure table. The target area was identified and the area prepped in the usual manner. Skin & deeper tissues infiltrated with local anesthetic. Appropriate amount of time allowed to pass for local anesthetics to take effect. The procedure needles were then advanced to the target area. Proper needle placement secured. Negative aspiration confirmed. Solution injected in intermittent fashion, asking for systemic symptoms every 0.5cc of injectate. The needles were then removed and the area cleansed, making sure to leave some of the prepping solution back to take advantage of its long term bactericidal properties.  Vitals:   11/14/23 1419 11/14/23 1424 11/14/23 1428 11/14/23 1432  BP: 118/80 117/79 123/79 132/81  Resp: 18 17 18 18   SpO2: 99% 99% 99% 99%     Start Time: 1430 hrs. End Time: 1432 hrs.  Imaging Guidance (nonSpinal):         Type of Imaging Technique: Fluoroscopy Guidance (nonSpinal) Indication(s): Fluoroscopy guidance for needle placement to enhance accuracy in procedures requiring precise needle localization for targeted delivery of medication in or near specific anatomical locations not easily accessible without such real-time imaging assistance. Exposure Time: Please see nurses notes. Contrast: Before injecting any contrast, we confirmed that the patient did not have an allergy to iodine, shellfish, or radiological contrast. Once satisfactory needle placement was completed at the desired level, radiological contrast was injected. Contrast injected under  live fluoroscopy. No contrast complications. See chart for type and volume of contrast used. Fluoroscopic Guidance: I was personally present during the use of fluoroscopy. Tunnel Vision Technique used to obtain the best possible view of the target area. Parallax error corrected before commencing the procedure. Direction-depth-direction technique used to introduce the needle under continuous pulsed fluoroscopy. Once target was reached, antero-posterior, oblique, and lateral fluoroscopic projection used confirm needle placement in all planes. Images permanently stored in EMR. Interpretation: I personally interpreted the imaging intraoperatively. Adequate needle placement confirmed in multiple planes. Appropriate spread of contrast into desired area was observed. No evidence of afferent or efferent intravascular uptake. No intrathecal or subarachnoid spread observed. Permanent images saved into the patient's record.  Antibiotic Prophylaxis:   Anti-infectives (From admission, onward)    None      Indication(s): None identified  Post-operative Assessment:  Post-procedure Vital Signs:  Pulse/HCG Rate:  66 Temp:   Resp: 18 BP: 132/81 SpO2: 99 %  EBL: None  Complications: No immediate post-treatment complications observed by team, or reported by patient.  Note: The patient tolerated the entire procedure well. A repeat set of vitals were taken after the procedure and the patient was kept under observation following institutional policy, for this type of procedure. Post-procedural neurological assessment was performed, showing return to baseline, prior to discharge. The patient was provided with post-procedure discharge instructions, including a section on how to identify potential problems. Should any problems arise concerning this procedure, the patient was given instructions to immediately contact us , at any time, without hesitation. In any case, we plan to contact the patient by telephone for  a follow-up status report regarding this interventional procedure.  Comments:  No additional relevant information.  Plan of Care (POC)  Orders:  Orders Placed This Encounter  Procedures   DG PAIN CLINIC C-ARM 1-60 MIN NO REPORT    Intraoperative interpretation by procedural physician at [facility].    Standing  Status:   Standing    Number of Occurrences:   1    Reason for exam::   Assistance in needle guidance and placement for procedures requiring needle placement in or near specific anatomical locations not easily accessible without such assistance.    Medications ordered for procedure: Meds ordered this encounter  Medications   lidocaine  (XYLOCAINE ) 2 % (with pres) injection 400 mg   dexamethasone  (DECADRON ) injection 10 mg   ropivacaine  (PF) 2 mg/mL (0.2%) (NAROPIN ) injection 9 mL   iohexol  (OMNIPAQUE ) 180 MG/ML injection 10 mL    Must be Myelogram-compatible. If not available, you may substitute with a water-soluble, non-ionic, hypoallergenic, myelogram-compatible radiological contrast medium.   Medications administered: We administered lidocaine , dexamethasone , and ropivacaine  (PF) 2 mg/mL (0.2%).  See the medical record for exact dosing, route, and time of administration.    Left SSNB 07/12/22, pRFA 10/27/22; Right SSNB 11/14/23     Follow-up plan:   Return in about 4 weeks (around 12/12/2023) for F2F PPE.     Recent Visits Date Type Provider Dept  10/25/23 Office Visit Marcelino Nurse, MD [facility]  Showing recent visits within past 90 days and meeting all other requirements Today's Visits Date Type Provider Dept  11/14/23 Procedure visit Marcelino Nurse, MD [facility]  Showing today's visits and meeting all other requirements Future Appointments Date Type Provider Dept  12/20/23 Appointment Marcelino Nurse, MD [facility]  Showing future appointments within next 90 days and meeting all other requirements   Disposition:  Discharge home  Discharge (Date  Time): 11/14/2023; 1440 hrs.   Primary Care Physician: Cyndi Shaver, PA-C Location: [facility] Note by: Nurse Marcelino, MD (TTS technology used. I apologize for any typographical errors that were not detected and corrected.) Date: 11/14/2023; Time: 2:59 PM  Disclaimer:  Medicine is not an Visual merchandiser. The only guarantee in medicine is that nothing is guaranteed. It is important to note that the decision to proceed with this intervention was based on the information collected from the patient. The Data and conclusions were drawn from the patient's questionnaire, the interview, and the physical examination. Because the information was provided in large part by the patient, it cannot be guaranteed that it has not been purposely or unconsciously manipulated. Every effort has been made to obtain as much relevant data as possible for this evaluation. It is important to note that the conclusions that lead to this procedure are derived in large part from the available data. Always take into account that the treatment will also be dependent on availability of resources and existing treatment guidelines, considered by other Pain Management Practitioners as being common knowledge and practice, at the time of the intervention. For Medico-Legal purposes, it is also important to point out that variation in procedural techniques and pharmacological choices are the acceptable norm. The indications, contraindications, technique, and results of the above procedure should only be interpreted and judged by a Board-Certified Interventional Pain Specialist with extensive familiarity and expertise in the same exact procedure and technique.

## 2023-11-15 ENCOUNTER — Telehealth: Payer: Self-pay

## 2023-11-15 NOTE — Telephone Encounter (Signed)
 No issues post-procedure.

## 2023-11-17 NOTE — Progress Notes (Signed)
 [facility] Quality Team Note  Name: Lisa Levine Date of Birth: 03-30-1976 MRN: [id number] Date: 11/17/2023  [facility] Quality Team has reviewed this patient's chart, please see recommendations below:  [facility] Quality Other; (CHART REVIEWED FOR KIDNEY HEALTH EVALUATION IN DIABETICS AS WELL AS DIABETIC EYE EXAM. ABSTRACTED LABS FOR KED MEASURE. NO RECORD FOUND FOR DIABETIC EYE EXAM)

## 2023-11-21 ENCOUNTER — Encounter: Payer: Self-pay | Admitting: Student in an Organized Health Care Education/Training Program

## 2023-11-23 ENCOUNTER — Encounter: Payer: Self-pay | Admitting: Physician Assistant

## 2023-11-23 ENCOUNTER — Other Ambulatory Visit: Payer: Self-pay | Admitting: Physician Assistant

## 2023-11-23 DIAGNOSIS — G8929 Other chronic pain: Secondary | ICD-10-CM

## 2023-11-30 DIAGNOSIS — Z9641 Presence of insulin pump (external) (internal): Secondary | ICD-10-CM | POA: Diagnosis not present

## 2023-11-30 DIAGNOSIS — Z794 Long term (current) use of insulin: Secondary | ICD-10-CM | POA: Diagnosis not present

## 2023-11-30 DIAGNOSIS — E1042 Type 1 diabetes mellitus with diabetic polyneuropathy: Secondary | ICD-10-CM | POA: Diagnosis not present

## 2023-12-05 ENCOUNTER — Ambulatory Visit (INDEPENDENT_AMBULATORY_CARE_PROVIDER_SITE_OTHER): Admitting: Orthopedic Surgery

## 2023-12-05 ENCOUNTER — Other Ambulatory Visit (INDEPENDENT_AMBULATORY_CARE_PROVIDER_SITE_OTHER): Payer: Self-pay

## 2023-12-05 DIAGNOSIS — M25511 Pain in right shoulder: Secondary | ICD-10-CM

## 2023-12-05 NOTE — Progress Notes (Signed)
 Orthopedic Spine Surgery Office Note  Assessment: Patient is a 48 y.o. female with right shoulder pain with suspicion for rotator cuff as etiology   Plan: -Explained that initially conservative treatment is tried as a significant number of patients may experience relief with these treatment modalities. Discussed that the conservative treatments include:  -activity modification  -physical therapy  -over the counter pain medications  -medrol  dosepak  -cervical steroid injections -Patient has tried tylenol , celebrex, suprascapular nerve block -Recommended MRI of the shoulder to evaluate for rotator cuff tear -Patient should return to office in 4 weeks, x-rays at next visit: none   Patient expressed understanding of the plan and all questions were answered to the patient's satisfaction.   ___________________________________________________________________________   History:  Patient is a 48 y.o. female who presents today for right shoulder pain.  Patient first noted onset of right shoulder pain in February 2025.  There was no trauma or injury that preceded the onset of the pain.  She feels the pain over the trapezius and the lateral aspect of the shoulder to the level of the mid humerus.  She does not have any pain in the left upper extremity.  She feels the pain is worse with overhead activity and with internal rotation.  She has not had pain like this before.  Her pain is generally better if she is at rest.    Past medical history: History of DVT HTN HLD Peripheral neuropathy DM1  Allergies: NKDA  Past surgical history:  None  Social history: Denies use of nicotine product (smoking, vaping, patches, smokeless) Alcohol use: denies Denies recreational drug use   Physical Exam:  General: no acute distress, appears stated age Neurologic: alert, answering questions appropriately, following commands Respiratory: unlabored breathing on room air, symmetric chest  rise Psychiatric: appropriate affect, normal cadence to speech   MSK:  -Spurling: negative bilaterally  Right shoulder exam: Pain with slight weakness with Jobe test, pain with external rotation past 70 degrees, no pain with internal rotation, no weakness with external rotation with arm at side, TTP over the bicipital groove, negative belly press  Imaging: XRs of the right shoulder from 12/05/2023 were independently reviewed and interpreted, showing no significant degenerative changes within the glenohumeral joint or the acromioclavicular joint.  No fracture or dislocation seen.   Patient name: Lisa Levine Patient MRN: [id number] Date of visit: 12/05/23

## 2023-12-06 ENCOUNTER — Encounter: Payer: Self-pay | Admitting: Orthopedic Surgery

## 2023-12-20 ENCOUNTER — Ambulatory Visit: Admitting: Student in an Organized Health Care Education/Training Program

## 2023-12-21 ENCOUNTER — Ambulatory Visit
Admission: RE | Admit: 2023-12-21 | Discharge: 2023-12-21 | Disposition: A | Discharge status: Home / Self Care | Source: Ambulatory Visit | Attending: Orthopedic Surgery | Admitting: Orthopedic Surgery

## 2023-12-21 DIAGNOSIS — M25511 Pain in right shoulder: Secondary | ICD-10-CM

## 2023-12-23 ENCOUNTER — Encounter: Payer: Self-pay | Admitting: Orthopedic Surgery

## 2023-12-23 DIAGNOSIS — G8929 Other chronic pain: Secondary | ICD-10-CM

## 2023-12-27 MED ORDER — PREGABALIN 75 MG PO CAPS: 75.0000 mg | ORAL_CAPSULE | Freq: Two times a day (BID) | ORAL | 0 refills | Status: AC

## 2024-01-06 ENCOUNTER — Encounter: Payer: Self-pay | Admitting: Sports Medicine

## 2024-01-06 ENCOUNTER — Ambulatory Visit (INDEPENDENT_AMBULATORY_CARE_PROVIDER_SITE_OTHER): Admitting: Sports Medicine

## 2024-01-06 ENCOUNTER — Other Ambulatory Visit: Payer: Self-pay

## 2024-01-06 DIAGNOSIS — M7501 Adhesive capsulitis of right shoulder: Secondary | ICD-10-CM

## 2024-01-06 DIAGNOSIS — M25511 Pain in right shoulder: Secondary | ICD-10-CM | POA: Diagnosis not present

## 2024-01-06 DIAGNOSIS — G8929 Other chronic pain: Secondary | ICD-10-CM

## 2024-01-06 DIAGNOSIS — E109 Type 1 diabetes mellitus without complications: Secondary | ICD-10-CM | POA: Diagnosis not present

## 2024-01-06 MED ORDER — BUPIVACAINE HCL 0.25 % IJ SOLN
2.0000 mL | INTRAMUSCULAR | Status: AC | PRN
  Administered 2024-01-06: 2 mL via INTRA_ARTICULAR

## 2024-01-06 MED ORDER — LIDOCAINE HCL 1 % IJ SOLN
2.0000 mL | INTRAMUSCULAR | Status: AC | PRN
  Administered 2024-01-06: 2 mL

## 2024-01-06 MED ORDER — METHYLPREDNISOLONE ACETATE 40 MG/ML IJ SUSP
40.0000 mg | INTRAMUSCULAR | Status: AC | PRN
  Administered 2024-01-06: 40 mg via INTRA_ARTICULAR

## 2024-01-06 NOTE — Progress Notes (Signed)
Patient was instructed in 10 minutes of therapeutic exercises for right shoulder to improve strength, ROM and function according to my instructions and plan of care by a Certified Athletic Trainer during the office visit. A customized handout was provided and demonstration of proper technique shown and discussed. Patient did perform exercises and demonstrate understanding through teachback.  All questions discussed and answered.  

## 2024-01-06 NOTE — Progress Notes (Signed)
 Lisa Levine - 48 y.o. female MRN [id number]  Date of birth: [birth year]  Office Visit Note: Visit Date: 01/06/2024 PCP: Cyndi Shaver, PA-C Referred by: Georgina Ozell LABOR, MD  Subjective: Chief Complaint  Patient presents with   Right Shoulder - Pain   HPI: Lisa Levine is a pleasant 48 y.o. female who presents today for chronic right shoulder pain with restriction in ROM.  She has had pain in the shoulder with insidious onset back in February.  There is no injury.  She notes pain in the shoulder as well as the trapezius.  She notes significant restriction in range of motion which is also worsened.  She has difficulty with overhead activity.  She has done physical therapy with banded exercises and has not had improvement in her symptoms.  She has also tried Celebrex and Tylenol  without relief.  Note reviewed from Dr. Marcelino, Pain Medicine from 11/14/2023 as patient underwent suprascapular nerve block on the right side.  She also saw Dr. Georgina in our clinic and did have an MRI which showed some rotator cuff arthropathy and a small degenerative labral tear.  He did recommend further evaluation by myself and intra-articular injection.  Pertinent ROS were reviewed with the patient and found to be negative unless otherwise specified above in HPI.   Assessment & Plan: Visit Diagnoses:  1. Adhesive capsulitis of right shoulder   2. Chronic right shoulder pain   3. Type 1 diabetes mellitus without complication (HCC)    Plan: Impression is chronic and progressive right shoulder pain with gross restriction in range of motion.  She did have an MRI which was reviewed and showed some mild rotator cuff tendinosis and a possible labral tear.  However, discussed with Shequita that her examination is very suggestive of adhesive capsulitis, I would favor this diagnosis given her gross active and passive restriction in motion as well as the fact that she is a type I diabetic and has a history of hyperthyroidism.   Through shared decision making, we did proceed with ultrasound-guided intra-articular right shoulder injection, patient tolerated well.  Advised on postinjection protocol.  I also would like her to hold from strength and banded based exercises for the shoulder and start with simple range of motion exercises.  We did print out a customized handout for frozen shoulder PT and my athletic trainer, Jinnie, did review these with her in the room today.  She will perform these multiple times daily.  I will discuss this case with Dr. Georgina specifically regarding the shoulder and see follow-up with him versus me for repeat evaluation and consideration of higher volume injection for the frozen picture aspect of her case. Will reach out to patient after this discussion.  She may continue her Lyrica  75 mg twice daily in the interim.  She is a type I diabetic but does have a CGM and insulin  pump, she will check for transient glucose rise given CSI and provide sliding scale as indicated.  Follow-up: Return for Will discuss with Dr. Georgina follow-up with him versus myself.   Meds & Orders: No orders of the defined types were placed in this encounter.   Orders Placed This Encounter  Procedures   Large Joint Inj   US  Guided Needle Placement - No Linked Charges     Procedures: Large Joint Inj: R glenohumeral on 01/06/2024 4:58 PM Indications: pain Details: 22 G 3.5 in needle, ultrasound-guided posterior approach Medications: 2 mL lidocaine  1 %; 2 mL bupivacaine  0.25 %;  40 mg methylPREDNISolone  acetate 40 MG/ML Outcome: tolerated well, no immediate complications  US -guided glenohumeral joint injection, Right shoulder After discussion on risks/benefits/indications, informed verbal consent was obtained. A timeout was then performed. The patient was positioned lying lateral recumbent on examination table. The patient's shoulder was prepped with betadine and multiple alcohol swabs and utilizing ultrasound guidance, the  patient's glenohumeral joint was identified on ultrasound. Using ultrasound guidance a 22-gauge, 3.5 inch needle with a mixture of 2:2:1 cc's lidocaine :bupivicaine:depomedrol was directed from a lateral to medial direction via in-plane technique into the glenohumeral joint with visualization of appropriate spread of injectate into the joint. Patient tolerated the procedure well without immediate complications.      Procedure, treatment alternatives, risks and benefits explained, specific risks discussed. Consent was given by the patient. Immediately prior to procedure a time out was called to verify the correct patient, procedure, equipment, support staff and site/side marked as required. Patient was prepped and draped in the usual sterile fashion.          Clinical History: No specialty comments available.  She reports that she has never smoked. She has never used smokeless tobacco.  Recent Labs    08/22/23 0000  HGBA1C 7.5    Objective:   Vital Signs: LMP 06/02/2010 (Approximate) Comment: pt states she has not had a period in 12-13 years  Physical Exam  Gen: Well-appearing, in no acute distress; non-toxic CV: Well-perfused. Warm.  Resp: Breathing unlabored on room air; no wheezing. Psych: Fluid speech in conversation; appropriate affect; normal thought process  Ortho Exam - Right shoulder: There is no redness swelling or effusion.  There is marked restriction in all planes of motion both active and passively.  Forward flexion approximate 105 degrees, abduction 95 degrees, external rotation 25 compared to 60 degrees of the contralateral arm.  Internal rotation with thumb to the right SI joint compared to T10 on the contralateral left arm.  Imaging:  *I did review the shoulder MRI during today's visit.  MR SHOULDER RIGHT WO CONTRAST CLINICAL DATA:  Right shoulder pain radiating to the right arm  EXAM: MRI OF THE RIGHT SHOULDER WITHOUT CONTRAST  TECHNIQUE: Multiplanar,  multisequence MR imaging of the shoulder was performed. No intravenous contrast was administered.  COMPARISON:  None Available.  FINDINGS: Rotator cuff: Mild supraspinatus tendinosis. Infraspinatus tendon is intact. Teres minor tendon is intact. Mild subscapularis tendinosis with a small low-grade partial-thickness articular surface tear.  Muscles: No muscle atrophy or edema. No intramuscular fluid collection or hematoma.  Biceps Long Head: Intraarticular and extraarticular portions of the biceps tendon are intact.  Acromioclavicular Joint: Mild arthropathy of the acromioclavicular joint. No subacromial/subdeltoid bursal fluid.  Glenohumeral Joint: No joint effusion. No chondral defect.  Labrum: Posterosuperior labral tear.  Bones: No fracture or dislocation. No aggressive osseous lesion.  Other: No fluid collection or hematoma.  IMPRESSION: 1. Mild supraspinatus tendinosis. 2. Mild subscapularis tendinosis with a small low-grade partial-thickness articular surface tear. 3. Posterosuperior labral tear.  Electronically Signed   By: Julaine Blanch M.D.   On: 12/24/2023 08:51  Past Medical/Family/Surgical/Social History: Medications & Allergies reviewed per EMR, new medications updated. Patient Active Problem List   Diagnosis Date Noted   Chronic right shoulder pain 11/14/2023   Degeneration of intervertebral disc of lumbar region with discogenic back pain and lower extremity pain 10/25/2023   Suprascapular nerve entrapment, right 06/29/2022   Chronic left shoulder pain 06/29/2022   Cervical radiculopathy 04/29/2022   Wrist pain, left 03/17/2022   Adhesive  capsulitis of left shoulder 12/09/2021   Lateral epicondylitis, left elbow 12/09/2021   Vitamin D  deficiency    Type 1 diabetes mellitus (HCC)    Thyroid  disease    Peripheral neuropathy    Hypertension    DVT (deep venous thrombosis) (HCC)    Allergy    Lateral epicondylitis of right elbow 11/01/2018   Carotid  artery syndrome hemispheric 10/27/2016   TIA (transient ischemic attack) 10/27/2016   Hypokalemia 10/27/2016   Normocytic normochromic anemia 10/27/2016   Migraine 06/05/2016   Syncope 06/04/2016   Sinusitis, acute maxillary 05/22/2015   Allergic rhinitis 02/14/2015   Diabetic neuropathy (HCC) 02/14/2015   Hyperlipidemia 02/14/2015   Low serum vitamin D  02/14/2015   Hyperthyroidism 02/14/2015   Skin lesion 02/14/2015   IDDM (insulin  dependent diabetes mellitus) (HCC) 02/14/2015   Past Medical History:  Diagnosis Date   Allergy    Arthritis    DVT (deep venous thrombosis) (HCC)    often on my LLE (06/04/2016)   Hyperlipidemia    Hypertension    Migraine    q couple months (06/04/2016)   Peripheral neuropathy    Thyroid  disease    Type 1 diabetes mellitus (HCC)    Vitamin D  deficiency    Family History  Problem Relation Age of Onset   Breast cancer Mother    Hyperlipidemia Father    Hypertension Father    Colon cancer Paternal Grandfather    Lung cancer Paternal Uncle        smoler   Liver disease Paternal Uncle    Past Surgical History:  Procedure Laterality Date   insulin  pump  2005-present   upgrades q 3 yrs (06/04/2016)   Social History   Occupational History   Occupation: [employer]  Tobacco Use   Smoking status: Never   Smokeless tobacco: Never  Substance and Sexual Activity   Alcohol use: No   Drug use: No   Sexual activity: Yes

## 2024-01-09 ENCOUNTER — Encounter: Payer: Self-pay | Admitting: Sports Medicine

## 2024-01-20 ENCOUNTER — Other Ambulatory Visit: Payer: Self-pay

## 2024-01-20 ENCOUNTER — Encounter: Payer: Self-pay | Admitting: Sports Medicine

## 2024-01-20 ENCOUNTER — Ambulatory Visit: Admitting: Sports Medicine

## 2024-01-20 DIAGNOSIS — M25511 Pain in right shoulder: Secondary | ICD-10-CM | POA: Diagnosis not present

## 2024-01-20 DIAGNOSIS — M7501 Adhesive capsulitis of right shoulder: Secondary | ICD-10-CM | POA: Diagnosis not present

## 2024-01-20 DIAGNOSIS — G8929 Other chronic pain: Secondary | ICD-10-CM | POA: Diagnosis not present

## 2024-01-20 DIAGNOSIS — E109 Type 1 diabetes mellitus without complications: Secondary | ICD-10-CM

## 2024-01-20 MED ORDER — LIDOCAINE HCL 1 % IJ SOLN
3.0000 mL | INTRAMUSCULAR | Status: AC | PRN
  Administered 2024-01-20: 3 mL

## 2024-01-20 MED ORDER — BUPIVACAINE HCL 0.25 % IJ SOLN
3.0000 mL | INTRAMUSCULAR | Status: AC | PRN
Start: 2024-01-20 — End: 2024-01-20
  Administered 2024-01-20: 3 mL via INTRA_ARTICULAR

## 2024-01-20 MED ORDER — METHYLPREDNISOLONE ACETATE 40 MG/ML IJ SUSP
40.0000 mg | INTRAMUSCULAR | Status: AC | PRN
  Administered 2024-01-20: 40 mg via INTRA_ARTICULAR

## 2024-01-20 NOTE — Progress Notes (Signed)
 Patient says that she is feeling 60-65% improved since the injection. She does still feel restricted in overhead motion and rotation in comparison to the unaffected left arm. She says that the exercises to seem to help her gain more motion, but she is having a lot of discomfort when sleeping.

## 2024-01-20 NOTE — Progress Notes (Signed)
 Lisa Levine - 48 y.o. female MRN [id number]  Date of birth: 1976-02-23  Office Visit Note: Visit Date: 01/20/2024 PCP: Cyndi Shaver, PA-C Referred by: Cyndi Shaver, PA-C  Subjective: Chief Complaint  Patient presents with   Right Shoulder - Follow-up   HPI: Lisa Levine is a pleasant 48 y.o. female who presents today for follow-up of chronic right shoulder pain with suspected adhesive capsulitis.  2 weeks ago, we did perform ultrasound-guided intra-articular right shoulder injection, this was significantly helpful for Lisa Levine.  She has much less pain and does have improvement in range of motion.  Since this, she does feel she is about 60-65% improved.  She has then progressed through our frozen shoulder rehab protocol at home.  She is still having discomfort with sleeping however and some restriction in ROM.  She did have large jump in blood glucose from the first injection but this only lasted about 1-2 hours before normal return.  She does know how to manage this and adjust her basal rate.  Pertinent ROS were reviewed with the patient and found to be negative unless otherwise specified above in HPI.   Assessment & Plan: Visit Diagnoses:  1. Chronic right shoulder pain   2. Adhesive capsulitis of right shoulder   3. Type 1 diabetes mellitus without complication (HCC)    Plan: Impression is chronic right shoulder pain with adhesive capsulitis. She did receive good relief both with pain reduction and improvement in ROM with ultrasound-guided injection.  Given her improvement (60-65%), but still pain and restriction, through shared decision making we did proceed with ultrasound-guided high-volume glenohumeral joint injection for the shoulder to help distend the capsule.  Patient tolerated well.  Advised on postinjection protocol.  Will monitor her BG with her CGM and adjust insulin  as indicated.  After 48 hours status postinjection, she will get started back into her frozen shoulder PT home  exercises on a consistent basis.  She will follow back up with Dr. Georgina over the next month to 6 weeks.  I am happy to see her back following that as needed.  Follow-up: Return for F/u with Dr. Georgina in 4-6 weeks R-shoulder .   Meds & Orders: No orders of the defined types were placed in this encounter.   Orders Placed This Encounter  Procedures   Large Joint Inj: R glenohumeral   US  Guided Needle Placement - No Linked Charges     Procedures: Large Joint Inj: R glenohumeral on 01/20/2024 2:46 PM Indications: pain Details: 22 G 3.5 in needle, ultrasound-guided posterior approach Medications: 3 mL lidocaine  1 %; 3 mL bupivacaine  0.25 %; 40 mg methylPREDNISolone  acetate 40 MG/ML Outcome: tolerated well, no immediate complications  US -guided glenohumeral joint high-volume injection, right shoulder After discussion on risks/benefits/indications, informed verbal consent was obtained. A timeout was then performed. The patient was positioned lying lateral recumbent on examination table. The patient's shoulder was prepped with betadine and multiple alcohol swabs and utilizing ultrasound guidance, the patient's glenohumeral joint was identified on ultrasound. Using ultrasound guidance a 22-gauge, 3.5 inch needle with a mixture of 3:3:1.5:8 cc's lidocaine :bupivicaine:depomedrol was directed from a lateral to medial direction via in-plane technique into the glenohumeral joint with visualization of appropriate spread of injectate into the joint. Patient tolerated the procedure well without immediate complications.      Procedure, treatment alternatives, risks and benefits explained, specific risks discussed. Consent was given by the patient. Immediately prior to procedure a time out was called to verify the correct patient,  procedure, equipment, support staff and site/side marked as required. Patient was prepped and draped in the usual sterile fashion.          Clinical History: No specialty  comments available.  She reports that she has never smoked. She has never used smokeless tobacco.  Recent Labs    08/22/23 0000  HGBA1C 7.5    Objective:   Vital Signs: LMP 06/02/2010 (Approximate) Comment: pt states she has not had a period in 12-13 years  Physical Exam  Gen: Well-appearing, in no acute distress; non-toxic CV: Well-perfused. Warm.  Resp: Breathing unlabored on room air; no wheezing. Psych: Fluid speech in conversation; appropriate affect; normal thought process  Ortho Exam - Right shoulder: There is no redness swelling or effusion of the right shoulder.  There is improved active and passive range of motion but still restricted.  ROM as follows: - Flexion 125 degrees, abduction 105 degrees, external rotation 40 degrees, internal rotation with thumb to L1  Imaging: No results found.  Past Medical/Family/Surgical/Social History: Medications & Allergies reviewed per EMR, new medications updated. Patient Active Problem List   Diagnosis Date Noted   Chronic right shoulder pain 11/14/2023   Degeneration of intervertebral disc of lumbar region with discogenic back pain and lower extremity pain 10/25/2023   Suprascapular nerve entrapment, right 06/29/2022   Chronic left shoulder pain 06/29/2022   Cervical radiculopathy 04/29/2022   Wrist pain, left 03/17/2022   Adhesive capsulitis of left shoulder 12/09/2021   Lateral epicondylitis, left elbow 12/09/2021   Vitamin D  deficiency    Type 1 diabetes mellitus (HCC)    Thyroid  disease    Peripheral neuropathy    Hypertension    DVT (deep venous thrombosis) (HCC)    Allergy    Lateral epicondylitis of right elbow 11/01/2018   Carotid artery syndrome hemispheric 10/27/2016   TIA (transient ischemic attack) 10/27/2016   Hypokalemia 10/27/2016   Normocytic normochromic anemia 10/27/2016   Migraine 06/05/2016   Syncope 06/04/2016   Sinusitis, acute maxillary 05/22/2015   Allergic rhinitis 02/14/2015   Diabetic  neuropathy (HCC) 02/14/2015   Hyperlipidemia 02/14/2015   Low serum vitamin D  02/14/2015   Hyperthyroidism 02/14/2015   Skin lesion 02/14/2015   IDDM (insulin  dependent diabetes mellitus) (HCC) 02/14/2015   Past Medical History:  Diagnosis Date   Allergy    Arthritis    DVT (deep venous thrombosis) (HCC)    often on my LLE (06/04/2016)   Hyperlipidemia    Hypertension    Migraine    q couple months (06/04/2016)   Peripheral neuropathy    Thyroid  disease    Type 1 diabetes mellitus (HCC)    Vitamin D  deficiency    Family History  Problem Relation Age of Onset   Breast cancer Mother    Hyperlipidemia Father    Hypertension Father    Colon cancer Paternal Grandfather    Lung cancer Paternal Uncle        smoler   Liver disease Paternal Uncle    Past Surgical History:  Procedure Laterality Date   insulin  pump  2005-present   upgrades q 3 yrs (06/04/2016)   Social History   Occupational History   Occupation: [employer]  Tobacco Use   Smoking status: Never   Smokeless tobacco: Never  Substance and Sexual Activity   Alcohol use: No   Drug use: No   Sexual activity: Yes

## 2024-02-23 ENCOUNTER — Ambulatory Visit: Admitting: Orthopedic Surgery

## 2024-03-21 ENCOUNTER — Ambulatory Visit: Admitting: Orthopedic Surgery

## 2024-03-26 ENCOUNTER — Encounter: Payer: Self-pay | Admitting: Radiology

## 2024-03-29 ENCOUNTER — Encounter: Payer: Self-pay | Admitting: Student in an Organized Health Care Education/Training Program

## 2024-03-29 ENCOUNTER — Ambulatory Visit
Attending: Student in an Organized Health Care Education/Training Program | Admitting: Student in an Organized Health Care Education/Training Program

## 2024-03-29 VITALS — BP 121/73 | HR 73 | Temp 97.4°F | Resp 18 | Ht 58.5 in | Wt 120.0 lb

## 2024-03-29 DIAGNOSIS — G5681 Other specified mononeuropathies of right upper limb: Secondary | ICD-10-CM | POA: Diagnosis present

## 2024-03-29 DIAGNOSIS — M25511 Pain in right shoulder: Secondary | ICD-10-CM | POA: Diagnosis not present

## 2024-03-29 DIAGNOSIS — M542 Cervicalgia: Secondary | ICD-10-CM | POA: Insufficient documentation

## 2024-03-29 DIAGNOSIS — G8929 Other chronic pain: Secondary | ICD-10-CM | POA: Insufficient documentation

## 2024-03-29 NOTE — Progress Notes (Signed)
 Safety precautions to be maintained throughout the outpatient stay will include: orient to surroundings, keep bed in low position, maintain call bell within reach at all times, provide assistance with transfer out of bed and ambulation.

## 2024-03-29 NOTE — Progress Notes (Signed)
 PROVIDER NOTE: Interpretation of information contained herein should be left to medically-trained personnel. Specific patient instructions are provided elsewhere under Patient Instructions section of medical record. This document was created in part using AI and STT-dictation technology, any transcriptional errors that may result from this process are unintentional.  Patient: Lisa Levine  Service: E/M   PCP: Cyndi Shaver, PA-C (Inactive)  DOB: 1976/05/20  DOS: 03/29/2024  Provider: Wallie Sherry, MD  MRN: [id number]  Delivery: Face-to-face  Specialty: Interventional Pain Management  Type: Established Patient  Setting: Ambulatory outpatient facility  Specialty designation: 09  Referring Prov.: No ref. provider found  Location: Outpatient office facility       History of present illness (HPI) [name], a 48 y.o. 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 [name] primary complain today is Shoulder Pain (Right)  Pertinent problems: [name] does not have any pertinent problems on file.  Pain Assessment: Severity of Chronic pain is reported as a 8 /10. Location: Shoulder Right/To right Elbow. Onset: More than a month ago. Quality: Throbbing. Timing: Constant. Modifying factor(s): Celebrex, Meloxicam . Vitals:  height is 4' 10.5 (1.486 m) and weight is 120 lb (54.4 kg). Her temporal temperature is 97.4 F (36.3 C) (abnormal). Her blood pressure is 121/73 and her pulse is 73. Her respiration is 18 and oxygen saturation is 100%.  BMI: Estimated body mass index is 24.65 kg/m as calculated from the following:   Height as of this encounter: 4' 10.5 (1.486 m).   Weight as of this encounter: 120 lb (54.4 kg).  Last encounter: 10/25/2023. Last procedure: 11/14/2023.  Reason for encounter: f/u  History of Present Illness   Lisa Levine is a 48 year old female with a history of RIGHT frozen shoulder who presents with worsening RIGHT shoulder pain and  functional limitations.  She has a history of frozen shoulder, which has been worsening recently. The pain is throbbing and non-radiating, primarily affecting her right shoulder, which is her dominant side. This has led to significant difficulty with typing and completing work tasks, causing her to fall behind on charting notes.  The pain also causes numbness and impacts her ability to use her right hand effectively. She previously received a right suprascapular nerve block in June, which provided approximately 75% relief, but the pain has since returned.  She is currently taking Celebrex, which she finds effective without causing drowsiness. She also uses over-the-counter ibuprofen  as needed and wants to avoid medications that cause drowsiness.  No shooting pain.        ROS  Constitutional: Denies any fever or chills Gastrointestinal: No reported hemesis, hematochezia, vomiting, or acute GI distress Musculoskeletal: Right shoulder pain Neurological: No reported episodes of acute onset apraxia, aphasia, dysarthria, agnosia, amnesia, paralysis, loss of coordination, or loss of consciousness  Medication Review  Vitamin D  (Ergocalciferol ), atorvastatin , cetirizine, chlorhexidine, diclofenac  Sodium, ibuprofen , insulin  NPH Human, insulin  aspart, levocetirizine, methocarbamol , methylPREDNISolone , and pregabalin   History Review  Allergy: [name] has no known allergies. Drug: [name]  reports no history of drug use. Alcohol:  reports no history of alcohol use. Tobacco:  reports that she has never smoked. She has never used smokeless tobacco. Social: [name]  reports that she has never smoked. She has never used smokeless tobacco. She reports that she does not drink alcohol and does not use drugs. Medical:  has a past medical history of Allergy, Arthritis, DVT (deep venous thrombosis) (HCC), Hyperlipidemia, Hypertension,  Migraine, Peripheral neuropathy, Thyroid  disease, Type 1 diabetes mellitus  (HCC), and Vitamin D  deficiency. Surgical: [name]  has a past surgical history that includes insulin  pump (2005-present). Family: family history includes Breast cancer in her mother; Colon cancer in her paternal grandfather; Hyperlipidemia in her father; Hypertension in her father; Liver disease in her paternal uncle; Lung cancer in her paternal uncle.  Laboratory Chemistry Profile   Renal Lab Results  Component Value Date   BUN 14 08/23/2023   CREATININE 0.65 08/23/2023   BCR 15 10/23/2020   GFR 105.00 08/23/2023   GFRAA >60 10/25/2017   GFRNONAA >60 12/15/2021    Hepatic Lab Results  Component Value Date   AST 17 12/15/2021   ALT 18 12/15/2021   ALBUMIN 4.0 12/15/2021   ALKPHOS 49 12/15/2021   LIPASE 31 10/25/2017    Electrolytes Lab Results  Component Value Date   NA 138 08/23/2023   K 4.2 08/23/2023   CL 102 08/23/2023   CALCIUM  8.8 08/23/2023   MG 1.9 10/27/2016    Bone Lab Results  Component Value Date   VD25OH 18.80 (L) 08/01/2023   VD125OH2TOT 51 04/23/2021   [id number] 51 04/23/2021   [id number] <8 04/23/2021    Inflammation (CRP: Acute Phase) (ESR: Chronic Phase) Lab Results  Component Value Date   CRP 0.7 10/16/2015   ESRSEDRATE 13 10/16/2015         Note: Above Lab results reviewed.  Recent Imaging Review  MR SHOULDER RIGHT WO CONTRAST CLINICAL DATA:  Right shoulder pain radiating to the right arm  EXAM: MRI OF THE RIGHT SHOULDER WITHOUT CONTRAST  TECHNIQUE: Multiplanar, multisequence MR imaging of the shoulder was performed. No intravenous contrast was administered.  COMPARISON:  None Available.  FINDINGS: Rotator cuff: Mild supraspinatus tendinosis. Infraspinatus tendon is intact. Teres minor tendon is intact. Mild subscapularis tendinosis with a small low-grade partial-thickness articular surface tear.  Muscles: No muscle atrophy or edema. No intramuscular fluid collection or hematoma.  Biceps Long Head: Intraarticular and  extraarticular portions of the biceps tendon are intact.  Acromioclavicular Joint: Mild arthropathy of the acromioclavicular joint. No subacromial/subdeltoid bursal fluid.  Glenohumeral Joint: No joint effusion. No chondral defect.  Labrum: Posterosuperior labral tear.  Bones: No fracture or dislocation. No aggressive osseous lesion.  Other: No fluid collection or hematoma.  IMPRESSION: 1. Mild supraspinatus tendinosis. 2. Mild subscapularis tendinosis with a small low-grade partial-thickness articular surface tear. 3. Posterosuperior labral tear.  Electronically Signed   By: Julaine Blanch M.D.   On: 12/24/2023 08:51 Note: Reviewed        Physical Exam  Vitals: BP 121/73 (BP Location: Left Arm, Patient Position: Sitting, Cuff Size: Normal)   Pulse 73   Temp (!) 97.4 F (36.3 C) (Temporal)   Resp 18   Ht 4' 10.5 (1.486 m)   Wt 120 lb (54.4 kg)   LMP 06/02/2010 (Approximate) Comment: pt states she has not had a period in 12-13 years  SpO2 100%   BMI 24.65 kg/m  BMI: Estimated body mass index is 24.65 kg/m as calculated from the following:   Height as of this encounter: 4' 10.5 (1.486 m).   Weight as of this encounter: 120 lb (54.4 kg). Ideal: Patient must be at least 60 in tall to calculate ideal body weight General appearance: Well nourished, well developed, and well hydrated. In no apparent acute distress Mental status: Alert, oriented x 3 (person, place, & time)       Respiratory: No evidence of acute  respiratory distress Eyes: PERLA  Cervical Spine Area Exam  Skin & Axial Inspection: No masses, redness, edema, swelling, or associated skin lesions Alignment: Symmetrical Functional ROM: Unrestricted ROM      Stability: No instability detected Muscle Tone/Strength: Functionally intact. No obvious neuro-muscular anomalies detected. Sensory (Neurological): Dermatomal pain pattern Palpation: No palpable anomalies             Upper Extremity (UE) Exam      Side:  Right upper extremity   Side: Left upper extremity  Skin & Extremity Inspection: Skin color, temperature, and hair growth are WNL. No peripheral edema or cyanosis. No masses, redness, swelling, asymmetry, or associated skin lesions. No contractures.   Skin & Extremity Inspection: Skin color, temperature, and hair growth are WNL. No peripheral edema or cyanosis. No masses, redness, swelling, asymmetry, or associated skin lesions. No contractures.  Functional ROM: pain restricted ROM           Functional ROM: Pain restricted ROM for shoulder  Muscle Tone/Strength: Functionally intact. No obvious neuro-muscular anomalies detected.   Muscle Tone/Strength: Functionally intact. No obvious neuro-muscular anomalies detected.  Sensory (Neurological): Msk pain          Sensory (Neurological): Neurogenic pain pattern          Palpation: No palpable anomalies               Palpation: No palpable anomalies              Provocative Test(s):  Phalen's test: deferred Tinel's test: deferred Apley's scratch test (touch opposite shoulder):  Action 1 (Across chest): decreased Action 2 (Overhead): decreased Action 3 (LB reach): decreased     Provocative Test(s):  Phalen's test: deferred Tinel's test: deferred     Assessment   Diagnosis Status  1. Suprascapular nerve entrapment, right   2. Chronic right shoulder pain   3. Cervicalgia    Controlled Controlled Controlled   Updated Problems: No problems updated.  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 A previous right suprascapular nerve block provided 75% relief.  A right suprascapular nerve block is scheduled for November 17th, 2025. Continue Celebrex for pain management and discontinue meloxicam . Use ibuprofen  as needed for pain.  Future  considerations include suprascapular RFA versus peripheral nerve stimulation      [name] has a current medication list which includes the following long-term medication(s): atorvastatin , cetirizine, insulin  aspart, insulin  nph human, levocetirizine, and pregabalin .  Pharmacotherapy (Medications Ordered): No orders of the defined types were placed in this encounter.  Orders:  No orders of the defined types were placed in this encounter.    Left SSNB 07/12/22, pRFA 10/27/22; Right SSNB 11/14/23    Return in about 6 days (around 04/04/2024) for Right SSNB #2, in clinic [facility].    Recent Visits No visits were found meeting these conditions. Showing recent visits within past 90 days and meeting all other requirements Today's Visits Date Type Provider Dept  03/29/24 Office Visit Marcelino Nurse, MD [facility]  Showing today's visits and meeting all other requirements Future Appointments No visits were found meeting these conditions. Showing future appointments within next 90 days and meeting all other requirements  I discussed the assessment and treatment plan with the patient. The patient was provided an opportunity to ask questions and  all were answered. The patient agreed with the plan and demonstrated an understanding of the instructions.  Patient advised to call back or seek an in-person evaluation if the symptoms or condition worsens.  I personally spent a total of 20 minutes in the care of the patient today including preparing to see the patient, getting/reviewing separately obtained history, performing a medically appropriate exam/evaluation, counseling and educating, placing orders, and documenting clinical information in the EHR.   Note by: Wallie Sherry, MD (TTS and AI technology used. I apologize for any typographical errors that were not detected and corrected.) Date: 03/29/2024; Time: 10:59 AM

## 2024-03-29 NOTE — Patient Instructions (Signed)

## 2024-04-03 ENCOUNTER — Ambulatory Visit: Admitting: Student in an Organized Health Care Education/Training Program

## 2024-04-04 ENCOUNTER — Ambulatory Visit
Admission: RE | Admit: 2024-04-04 | Discharge: 2024-04-04 | Disposition: A | Discharge status: Home / Self Care | Source: Ambulatory Visit | Attending: Student in an Organized Health Care Education/Training Program | Admitting: Student in an Organized Health Care Education/Training Program

## 2024-04-04 ENCOUNTER — Encounter: Payer: Self-pay | Admitting: Student in an Organized Health Care Education/Training Program

## 2024-04-04 ENCOUNTER — Ambulatory Visit: Admitting: Student in an Organized Health Care Education/Training Program

## 2024-04-04 ENCOUNTER — Other Ambulatory Visit: Payer: Self-pay | Admitting: Student in an Organized Health Care Education/Training Program

## 2024-04-04 VITALS — BP 125/88 | HR 91 | Temp 97.9°F | Resp 18 | Ht 58.5 in | Wt 120.0 lb

## 2024-04-04 DIAGNOSIS — G5681 Other specified mononeuropathies of right upper limb: Secondary | ICD-10-CM

## 2024-04-04 DIAGNOSIS — G8929 Other chronic pain: Secondary | ICD-10-CM | POA: Diagnosis present

## 2024-04-04 DIAGNOSIS — G894 Chronic pain syndrome: Secondary | ICD-10-CM | POA: Insufficient documentation

## 2024-04-04 DIAGNOSIS — M25511 Pain in right shoulder: Secondary | ICD-10-CM

## 2024-04-04 MED ORDER — ROPIVACAINE HCL 2 MG/ML IJ SOLN
6.0000 mL | Freq: Once | INTRAMUSCULAR | Status: AC
  Administered 2024-04-04: 6 mL via PERINEURAL

## 2024-04-04 MED ORDER — ROPIVACAINE HCL 2 MG/ML IJ SOLN
INTRAMUSCULAR | Status: AC
Start: 2024-04-04 — End: 2024-04-04
  Filled 2024-04-04: qty 20

## 2024-04-04 MED ORDER — DEXAMETHASONE SOD PHOSPHATE PF 10 MG/ML IJ SOLN
10.0000 mg | Freq: Once | INTRAMUSCULAR | Status: AC
  Administered 2024-04-04: 10 mg

## 2024-04-04 MED ORDER — IOHEXOL 180 MG/ML  SOLN
10.0000 mL | Freq: Once | INTRAMUSCULAR | Status: AC
  Administered 2024-04-04: 10 mL via INTRA_ARTICULAR

## 2024-04-04 MED ORDER — LIDOCAINE HCL 2 % IJ SOLN
20.0000 mL | Freq: Once | INTRAMUSCULAR | Status: AC
  Administered 2024-04-04: 100 mg

## 2024-04-04 MED ORDER — IOHEXOL 180 MG/ML  SOLN
INTRAMUSCULAR | Status: AC
  Filled 2024-04-04: qty 20

## 2024-04-04 MED ORDER — SODIUM CHLORIDE (PF) 0.9 % IJ SOLN
INTRAMUSCULAR | Status: AC
  Filled 2024-04-04: qty 10

## 2024-04-04 MED ORDER — LIDOCAINE HCL (PF) 2 % IJ SOLN
INTRAMUSCULAR | Status: AC
Start: 2024-04-04 — End: 2024-04-04
  Filled 2024-04-04: qty 10

## 2024-04-04 NOTE — Patient Instructions (Signed)

## 2024-04-04 NOTE — Progress Notes (Signed)
 Safety precautions to be maintained throughout the outpatient stay will include: orient to surroundings, keep bed in low position, maintain call bell within reach at all times, provide assistance with transfer out of bed and ambulation.

## 2024-04-04 NOTE — Progress Notes (Addendum)
 PROVIDER NOTE: Interpretation of information contained herein should be left to medically-trained personnel. Specific patient instructions are provided elsewhere under Patient Instructions section of medical record. This document was created in part using STT-dictation technology, any transcriptional errors that may result from this process are unintentional.  Patient: Lisa Levine Type: Established DOB: 06-05-1975 MRN: [id number] PCP: Cyndi Shaver, PA-C (Inactive)  Service: Procedure DOS: 04/04/2024 Setting: Ambulatory Location: Ambulatory outpatient facility Delivery: Face-to-face Provider: Wallie Sherry, MD Specialty: Interventional Pain Management Specialty designation: 09 Location: Outpatient facility Ref. Prov.: No ref. provider found       Interventional Therapy   Procedure: Suprascapular nerve block (SSNB) #2  Laterality:  Right  Level: Superior to scapular spine, lateral to supraspinatus fossa (Suprascapular notch).  Imaging: Fluoroscopic guidance         Anesthesia: Local anesthesia (1-2% Lidocaine ) Sedation:                         DOS: 04/04/2024  Performed by: Wallie Sherry, MD  Purpose: Diagnostic/Therapeutic Indications: Shoulder pain, severe enough to impact quality of life and/or function. 1. Suprascapular nerve entrapment, right   2. Chronic right shoulder pain    NAS-11 score:   Pre-procedure: 8 /10   Post-procedure: 2 /10     Target: Suprascapular nerve Location: midway between the medial border of the scapula and the acromion as it runs through the suprascapular notch. Region: Suprascapular, posterior shoulder  Approach: Percutaneous  Neuroanatomy: The suprascapular nerve is the lateral branch of the superior trunk of the brachial plexus. It receives nerve fibers that originate in the nerve roots C5 and C6 (and sometimes C4). It is a mixed nerve, meaning that it provides both sensory and motor supply for the suprascapular region. Function: The main function  of this nerve is to provide motor innervation for two muscles, the supraspinatus and infraspinatus muscles. They are part of the rotator cuff muscles. In addition, the suprascapular nerve provides a sensory supply to the joints of the scapula (glenohumeral and acromioclavicular joints). Rationale (medical necessity): procedure needed and proper for the diagnosis and/or treatment of the patient's medical symptoms and needs.  Position / Prep / Materials:  Position: Prone Materials:  Tray: Block Needle(s):  Type: Spinal  Gauge (G): 22  Length: 3.5 in.  Qty: 1 Prep solution: ChloraPrep (2% chlorhexidine gluconate and 70% isopropyl alcohol) Prep Area: Entire posterior shoulder area. From upper spine to shoulder proper (upper arm), and from lateral neck to lower tip of shoulder blade.   H&P (Pre-op Assessment):  Lisa Levine is a 48 y.o. (year old), female patient, seen today for interventional treatment. She  has a past surgical history that includes insulin  pump (2005-present). Lisa Levine has a current medication list which includes the following prescription(s): atorvastatin , cetirizine, chlorhexidine, diclofenac  sodium, ibuprofen , insulin  aspart, insulin  nph human, levocetirizine, methocarbamol , methylprednisolone , pregabalin , and vitamin d  (ergocalciferol ). Her primarily concern today is the Shoulder Pain (Right )  Initial Vital Signs:  Pulse/HCG Rate: 91ECG Heart Rate: 78 Temp: 97.9 F (36.6 C) Resp: 16 BP: 118/73 SpO2: 100 %  BMI: Estimated body mass index is 24.65 kg/m as calculated from the following:   Height as of this encounter: 4' 10.5 (1.486 m).   Weight as of this encounter: 120 lb (54.4 kg).  Risk Assessment: Allergies: Reviewed. She has no known allergies.  Allergy Precautions: None required Coagulopathies: Reviewed. None identified.  Blood-thinner therapy: None at this time Active Infection(s): Reviewed. None identified. Lisa Levine is afebrile  Site Confirmation: Lisa Levine was  asked to confirm the procedure and laterality before marking the site Procedure checklist: Completed Consent: Before the procedure and under the influence of no sedative(s), amnesic(s), or anxiolytics, the patient was informed of the treatment options, risks and possible complications. To fulfill our ethical and legal obligations, as recommended by the American Medical Association's Code of Ethics, I have informed the patient of my clinical impression; the nature and purpose of the treatment or procedure; the risks, benefits, and possible complications of the intervention; the alternatives, including doing nothing; the risk(s) and benefit(s) of the alternative treatment(s) or procedure(s); and the risk(s) and benefit(s) of doing nothing. The patient was provided information about the general risks and possible complications associated with the procedure. These may include, but are not limited to: failure to achieve desired goals, infection, bleeding, organ or nerve damage, allergic reactions, paralysis, and death. In addition, the patient was informed of those risks and complications associated to the procedure, such as failure to decrease pain; infection; bleeding; organ or nerve damage with subsequent damage to sensory, motor, and/or autonomic systems, resulting in permanent pain, numbness, and/or weakness of one or several areas of the body; allergic reactions; (i.e.: anaphylactic reaction); and/or death. Furthermore, the patient was informed of those risks and complications associated with the medications. These include, but are not limited to: allergic reactions (i.e.: anaphylactic or anaphylactoid reaction(s)); adrenal axis suppression; blood sugar elevation that in diabetics may result in ketoacidosis or comma; water retention that in patients with history of congestive heart failure may result in shortness of breath, pulmonary edema, and decompensation with resultant heart failure; weight gain; swelling  or edema; medication-induced neural toxicity; particulate matter embolism and blood vessel occlusion with resultant organ, and/or nervous system infarction; and/or aseptic necrosis of one or more joints. Finally, the patient was informed that Medicine is not an exact science; therefore, there is also the possibility of unforeseen or unpredictable risks and/or possible complications that may result in a catastrophic outcome. The patient indicated having understood very clearly. We have given the patient no guarantees and we have made no promises. Enough time was given to the patient to ask questions, all of which were answered to the patient's satisfaction. Ms. Gartland has indicated that she wanted to continue with the procedure. Attestation: I, the ordering provider, attest that I have discussed with the patient the benefits, risks, side-effects, alternatives, likelihood of achieving goals, and potential problems during recovery for the procedure that I have provided informed consent. Date  Time: 04/04/2024  1:32 PM  Pre-Procedure Preparation:  Monitoring: As per clinic protocol. Respiration, ETCO2, SpO2, BP, heart rate and rhythm monitor placed and checked for adequate function Safety Precautions: Patient was assessed for positional comfort and pressure points before starting the procedure. Time-out: I initiated and conducted the Time-out before starting the procedure, as per protocol. The patient was asked to participate by confirming the accuracy of the Time Out information. Verification of the correct person, site, and procedure were performed and confirmed by me, the nursing staff, and the patient. Time-out conducted as per Joint Commission's Universal Protocol (UP.01.01.01). Time: 1430 Start Time: 1430 hrs.  Description of Procedure:          Procedural Technique Safety Precautions: Aspiration looking for blood return was conducted prior to all injections. At no point did we inject any  substances, as a needle was being advanced. No attempts were made at seeking any paresthesias. Safe injection practices and needle disposal techniques used. Medications  properly checked for expiration dates. SDV (single dose vial) medications used. Description of the Procedure: Protocol guidelines were followed. The patient was placed in position over the procedure table. The target area was identified and the area prepped in the usual manner. Skin & deeper tissues infiltrated with local anesthetic. Appropriate amount of time allowed to pass for local anesthetics to take effect. The procedure needles were then advanced to the target area. Proper needle placement secured. Negative aspiration confirmed. Solution injected in intermittent fashion, asking for systemic symptoms every 0.5cc of injectate. The needles were then removed and the area cleansed, making sure to leave some of the prepping solution back to take advantage of its long term bactericidal properties.  Vitals:   04/04/24 1354 04/04/24 1429 04/04/24 1435  BP: 118/73 (!) 127/91 125/88  Pulse: 91    Resp: 16 18 18   Temp: 97.9 F (36.6 C)    TempSrc: Temporal    SpO2: 100% 99% 100%  Weight: 120 lb (54.4 kg)    Height: 4' 10.5 (1.486 m)       Start Time: 1430 hrs. End Time: 1432 hrs.  Imaging Guidance (nonSpinal):         Type of Imaging Technique: Fluoroscopy Guidance (nonSpinal) Indication(s): Fluoroscopy guidance for needle placement to enhance accuracy in procedures requiring precise needle localization for targeted delivery of medication in or near specific anatomical locations not easily accessible without such real-time imaging assistance. Exposure Time: Please see nurses notes. Contrast: Before injecting any contrast, we confirmed that the patient did not have an allergy to iodine, shellfish, or radiological contrast. Once satisfactory needle placement was completed at the desired level, radiological contrast was injected.  Contrast injected under live fluoroscopy. No contrast complications. See chart for type and volume of contrast used. Fluoroscopic Guidance: I was personally present during the use of fluoroscopy. Tunnel Vision Technique used to obtain the best possible view of the target area. Parallax error corrected before commencing the procedure. Direction-depth-direction technique used to introduce the needle under continuous pulsed fluoroscopy. Once target was reached, antero-posterior, oblique, and lateral fluoroscopic projection used confirm needle placement in all planes. Images permanently stored in EMR. Interpretation: I personally interpreted the imaging intraoperatively. Adequate needle placement confirmed in multiple planes. Appropriate spread of contrast into desired area was observed. No evidence of afferent or efferent intravascular uptake. No intrathecal or subarachnoid spread observed. Permanent images saved into the patient's record.  Antibiotic Prophylaxis:   Anti-infectives (From admission, onward)    None      Indication(s): None identified  Post-operative Assessment:  Post-procedure Vital Signs:  Pulse/HCG Rate: 9181 Temp: 97.9 F (36.6 C) Resp: 18 BP: 125/88 SpO2: 100 %  EBL: None  Complications: No immediate post-treatment complications observed by team, or reported by patient.  Note: The patient tolerated the entire procedure well. A repeat set of vitals were taken after the procedure and the patient was kept under observation following institutional policy, for this type of procedure. Post-procedural neurological assessment was performed, showing return to baseline, prior to discharge. The patient was provided with post-procedure discharge instructions, including a section on how to identify potential problems. Should any problems arise concerning this procedure, the patient was given instructions to immediately contact us , at any time, without hesitation. In any case, we  plan to contact the patient by telephone for a follow-up status report regarding this interventional procedure.  Comments:  No additional relevant information.  Plan of Care (POC)  Orders:  No orders of the defined types  were placed in this encounter.   Medications ordered for procedure: Meds ordered this encounter  Medications   lidocaine  (XYLOCAINE ) 2 % (with pres) injection 400 mg   iohexol  (OMNIPAQUE ) 180 MG/ML injection 10 mL    Must be Myelogram-compatible. If not available, you may substitute with a water-soluble, non-ionic, hypoallergenic, myelogram-compatible radiological contrast medium.   dexamethasone  (DECADRON ) injection 10 mg   ropivacaine  (PF) 2 mg/mL (0.2%) (NAROPIN ) injection 6 mL   Medications administered: We administered lidocaine , iohexol , dexamethasone , and ropivacaine  (PF) 2 mg/mL (0.2%).  See the medical record for exact dosing, route, and time of administration.    Left SSNB 07/12/22, pRFA 10/27/22; Right SSNB 11/14/23, 04/04/24     Follow-up plan:   Return in about 10 weeks (around 06/13/2024) for PPE, F2F (post SSNB#2).     Recent Visits Date Type Provider Dept  03/29/24 Office Visit Marcelino Nurse, MD [facility]  Showing recent visits within past 90 days and meeting all other requirements Today's Visits Date Type Provider Dept  04/04/24 Procedure visit Marcelino Nurse, MD [facility]  Showing today's visits and meeting all other requirements Future Appointments Date Type Provider Dept  06/12/24 Appointment Marcelino Nurse, MD [facility]  Showing future appointments within next 90 days and meeting all other requirements   Disposition: Discharge home  Discharge (Date  Time): 04/04/2024; 1442 hrs.   Primary Care Physician: Cyndi Shaver, PA-C (Inactive) Location: [facility] Note by: Nurse Marcelino, MD (TTS technology used. I apologize for any typographical errors that were not detected and  corrected.) Date: 04/04/2024; Time: 2:52 PM  Disclaimer:  Medicine is not an visual merchandiser. The only guarantee in medicine is that nothing is guaranteed. It is important to note that the decision to proceed with this intervention was based on the information collected from the patient. The Data and conclusions were drawn from the patient's questionnaire, the interview, and the physical examination. Because the information was provided in large part by the patient, it cannot be guaranteed that it has not been purposely or unconsciously manipulated. Every effort has been made to obtain as much relevant data as possible for this evaluation. It is important to note that the conclusions that lead to this procedure are derived in large part from the available data. Always take into account that the treatment will also be dependent on availability of resources and existing treatment guidelines, considered by other Pain Management Practitioners as being common knowledge and practice, at the time of the intervention. For Medico-Legal purposes, it is also important to point out that variation in procedural techniques and pharmacological choices are the acceptable norm. The indications, contraindications, technique, and results of the above procedure should only be interpreted and judged by a Board-Certified Interventional Pain Specialist with extensive familiarity and expertise in the same exact procedure and technique.

## 2024-04-05 ENCOUNTER — Telehealth: Payer: Self-pay

## 2024-04-05 NOTE — Telephone Encounter (Signed)
Patient doing well post procedure.

## 2024-04-09 ENCOUNTER — Encounter: Payer: Self-pay | Admitting: Student in an Organized Health Care Education/Training Program

## 2024-04-09 DIAGNOSIS — G5681 Other specified mononeuropathies of right upper limb: Secondary | ICD-10-CM

## 2024-04-10 MED ORDER — PREDNISONE 20 MG PO TABS: ORAL_TABLET | ORAL | 0 refills | Status: AC

## 2024-04-25 ENCOUNTER — Ambulatory Visit: Admitting: Sports Medicine

## 2024-04-25 ENCOUNTER — Encounter: Payer: Self-pay | Admitting: Sports Medicine

## 2024-04-25 ENCOUNTER — Other Ambulatory Visit: Payer: Self-pay

## 2024-04-25 DIAGNOSIS — M25511 Pain in right shoulder: Secondary | ICD-10-CM | POA: Diagnosis not present

## 2024-04-25 DIAGNOSIS — G8929 Other chronic pain: Secondary | ICD-10-CM

## 2024-04-25 DIAGNOSIS — M7501 Adhesive capsulitis of right shoulder: Secondary | ICD-10-CM | POA: Diagnosis not present

## 2024-04-25 DIAGNOSIS — E109 Type 1 diabetes mellitus without complications: Secondary | ICD-10-CM | POA: Diagnosis not present

## 2024-04-25 MED ORDER — LIDOCAINE HCL 1 % IJ SOLN
3.0000 mL | INTRAMUSCULAR | Status: AC | PRN
  Administered 2024-04-25: 3 mL

## 2024-04-25 MED ORDER — BUPIVACAINE HCL 0.25 % IJ SOLN
3.0000 mL | INTRAMUSCULAR | Status: AC | PRN
  Administered 2024-04-25: 3 mL via INTRA_ARTICULAR

## 2024-04-25 MED ORDER — METHYLPREDNISOLONE ACETATE 40 MG/ML IJ SUSP
40.0000 mg | INTRAMUSCULAR | Status: AC | PRN
  Administered 2024-04-25: 40 mg via INTRA_ARTICULAR

## 2024-04-25 NOTE — Progress Notes (Signed)
 Patient says that she got about 75% relief from the last injection, but her pain has returned. She does have limited ROM as well. She says that she does her exercises consistently, but holding her arm in an overhead position is painful.

## 2024-04-25 NOTE — Progress Notes (Addendum)
 Lisa Levine - 48 y.o. female MRN [id number]  Date of birth: 12-21-1975  Office Visit Note: Visit Date: 04/25/2024 PCP: Cyndi Shaver, PA-C (Inactive) Referred by: No ref. provider found  Subjective: Chief Complaint  Patient presents with  . Right Shoulder - Pain   HPI: Lisa Levine is a pleasant 48 y.o. female who presents today for acute on chronic right shoulder pain with adhesive capsulitis.  Lisa Levine presents for follow-up of her chronic right shoulder pain with restriction.  I did diagnose her with adhesive capsulitis back in August as she had seen a few other providers with mixed pathology.  We did perform 2 subsequent higher volume injections on 8/15 and 01/20/2024, about a month following these she had significant relief of her pain as well as improved range of motion.  When she was feeling better she did stop doing her home rehab exercises, she had very good relief for about 6 weeks before her pain and range of motion slowly started returning and becoming restricted again.  It continues to be very bothersome here recently. Uses Meloxicam  15mg  nightly as needed when pain severe.  Has to use Celebrex during the day instead of this at times as well.  She is a type I diabetic, she is managed on CGM and insulin  pump, she knows how to adjust her basal rate for any CSI.  Lab Results  Component Value Date   HGBA1C 7.5 08/22/2023   *Independent note review from PMR, Dr. Marcelino  -underwent suprascapular nerve block #2 on 04/04/2024. Uriyah states she only received a few days of relief.  Pertinent ROS were reviewed with the patient and found to be negative unless otherwise specified above in HPI.   Assessment & Plan: Visit Diagnoses:  1. Adhesive capsulitis of right shoulder   2. Chronic right shoulder pain   3. Type 1 diabetes mellitus without complications (HCC)    Plan: Impression is acute on chronic right shoulder pain with exacerbation of her underlying adhesive capsulitis. She did receive  very good relief of pain and her range of motion following 2 previous higher volume glenohumeral joint injections and HEP but then unfortunately her pain did return and her pain and range of motion is restricted again during today's visit.  We discussed the overall nature of adhesive capsulitis including treatments that may include additional high-volume glenohumeral joint injection versus surgical treatment such as manipulation under anesthesia, she is open to either of these based on my discretion. Through shared decision making, did proceed with US -guided R-GHJ HV injection, tolerated well.  Will allow for 48 to 72 hours of modified rest/activity.  Following this week, I would like her then to get started on her frozen shoulder rehab range of motion exercises on a consistent basis at least once to twice daily.  She may use either meloxicam  15 mg daily as needed or her Celebrex depending on which is more beneficial.  Did discuss transient glucose rise given CSI, she will monitor her blood glucose via CGM and adjust basal insulin  as indicated.  We will follow-up in 6 weeks, if she has good improvement in her range of motion and pain, we will continue this.  If she is still struggling quite a bit with either, we could consider referral to Dr. Jerri for manipulation under anesthesia.  Follow-up: Return in about 6 weeks (around 06/06/2024) for R-shoulder.   Meds & Orders:  Meds ordered this encounter  Medications  . bupivacaine  (MARCAINE ) 0.25 % (with pres) injection 3  mL  . lidocaine  (XYLOCAINE ) 1 % (with pres) injection 3 mL  . methylPREDNISolone  acetate (DEPO-MEDROL ) injection 40 mg    Orders Placed This Encounter  Procedures  . Large Joint Inj: R glenohumeral  . US  Guided Needle Placement - No Linked Charges     Procedures: Large Joint Inj: R glenohumeral on 04/25/2024 9:35 AM Indications: pain Details: 22 G 3.5 in needle, ultrasound-guided posterior approach Medications: 3 mL lidocaine  1 %; 3 mL  bupivacaine  0.25 %; 40 mg methylPREDNISolone  acetate 40 MG/ML (8 mL of NS) Outcome: tolerated well, no immediate complications  US -guided glenohumeral joint high-volume injection, right shoulder After discussion on risks/benefits/indications, informed verbal consent was obtained. A timeout was then performed. The patient was positioned lying lateral recumbent on examination table. The patient's shoulder was prepped with betadine and multiple alcohol swabs and utilizing ultrasound guidance, the patient's glenohumeral joint was identified on ultrasound. Using ultrasound guidance a 22-gauge, 3.5 inch needle with a mixture of 3:3:1.5:8 cc's lidocaine :bupivicaine:depomedro:NS was directed from a lateral to medial direction via in-plane technique into the glenohumeral joint with visualization of appropriate spread of injectate into the joint. Patient tolerated the procedure well without immediate complications  Procedure, treatment alternatives, risks and benefits explained, specific risks discussed. Consent was given by the patient. Immediately prior to procedure a time out was called to verify the correct patient, procedure, equipment, support staff and site/side marked as required. Patient was prepped and draped in the usual sterile fashion.          Clinical History: No specialty comments available.  She reports that she has never smoked. She has never used smokeless tobacco.  Recent Labs    08/22/23 0000  HGBA1C 7.5    Objective:   Vital Signs: LMP 06/02/2010 (Approximate) Comment: pt states she has not had a period in 12-13 years  Physical Exam  Gen: Well-appearing, in no acute distress; non-toxic CV: Well-perfused. Warm.  Resp: Breathing unlabored on room air; no wheezing. Psych: Fluid speech in conversation; appropriate affect; normal thought process  Ortho Exam - Right shoulder: No redness swelling or effusion of the shoulder.  There is restriction in both active and passive range  of motion as follows:  - Flexion: 130 degrees - Abduction: 90-95 degrees - External rotation: 30 degrees (70 degrees of contralateral arm) - Internal rotation: Thumb to L1 (compared to T11 of contralateral arm)  Imaging: No results found.  Past Medical/Family/Surgical/Social History: Medications & Allergies reviewed per EMR, new medications updated. Patient Active Problem List   Diagnosis Date Noted  . Chronic right shoulder pain 11/14/2023  . Degeneration of intervertebral disc of lumbar region with discogenic back pain and lower extremity pain 10/25/2023  . Suprascapular nerve entrapment, right 06/29/2022  . Chronic left shoulder pain 06/29/2022  . Cervical radiculopathy 04/29/2022  . Wrist pain, left 03/17/2022  . Adhesive capsulitis of left shoulder 12/09/2021  . Lateral epicondylitis, left elbow 12/09/2021  . Vitamin D  deficiency   . Type 1 diabetes mellitus (HCC)   . Thyroid  disease   . Peripheral neuropathy   . Hypertension   . DVT (deep venous thrombosis) (HCC)   . Allergy   . Lateral epicondylitis of right elbow 11/01/2018  . 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 TIA (transient ischemic attack) 10/27/2016  . Hypokalemia 10/27/2016  . Normocytic normochromic anemia 10/27/2016  . Migraine 06/05/2016  . Syncope 06/04/2016  . Sinusitis, acute maxillary 05/22/2015  . Allergic rhinitis 02/14/2015  . Diabetic neuropathy (HCC) 02/14/2015  . Hyperlipidemia  02/14/2015  . Low serum vitamin D  02/14/2015  . Hyperthyroidism 02/14/2015  . Skin lesion 02/14/2015  . IDDM (insulin  dependent diabetes mellitus) (HCC) 02/14/2015   Past Medical History:  Diagnosis Date  . Allergy   . Arthritis   . DVT (deep venous thrombosis) (HCC)    often on my LLE (06/04/2016)  . Hyperlipidemia   . Hypertension   . Migraine    q couple months (06/04/2016)  . Peripheral neuropathy   . Thyroid  disease   . Type 1 diabetes mellitus (HCC)   . Vitamin D  deficiency    Family  History  Problem Relation Age of Onset  . Breast cancer Mother   . Hyperlipidemia Father   . Hypertension Father   . Colon cancer Paternal Grandfather   . Lung cancer Paternal Uncle        smoler  . Liver disease Paternal Uncle    Past Surgical History:  Procedure Laterality Date  . insulin  pump  2005-present   upgrades q 3 yrs (06/04/2016)   Social History   Occupational History  . Occupation: [employer]  Tobacco Use  . Smoking status: Never  . Smokeless tobacco: Never  Substance and Sexual Activity  . Alcohol use: No  . Drug use: No  . Sexual activity: Yes

## 2024-05-15 ENCOUNTER — Encounter: Payer: Self-pay | Admitting: Family Medicine

## 2024-05-15 ENCOUNTER — Ambulatory Visit: Admitting: Family Medicine

## 2024-05-15 VITALS — BP 128/80 | HR 76 | Temp 98.1°F | Ht <= 58 in | Wt 120.6 lb

## 2024-05-15 DIAGNOSIS — R058 Other specified cough: Secondary | ICD-10-CM

## 2024-05-15 MED ORDER — HYDROCOD POLI-CHLORPHE POLI ER 10-8 MG/5ML PO SUER: 5.0000 mL | Freq: Two times a day (BID) | ORAL | 0 refills | Status: DC | PRN

## 2024-05-15 NOTE — Progress Notes (Signed)
 "  Acute Office Visit  Subjective:  Patient ID: Lisa Levine, female    DOB: 02-Jun-1975  Age: 48 y.o. MRN: [id number]  CC:  Chief Complaint  Patient presents with   URI   Cough    Onset - around 05/05/2024 -patient reported having a sore throat, coughing, no fever - patient stated that she has been taking Mucinex , Delsyum, and Cold/Flu medicine   Sore Throat      HPI Lisa Levine is here for cough.   Discussed the use of AI scribe software for clinical note transcription with the patient, who gave verbal consent to proceed.  History of Present Illness Lisa Levine is a 48 year old female who presents with a persistent cough and sore throat following a viral illness.  She has been experiencing symptoms for ten days, initially attributing them to a minor cold. Her exposure to children and use of over-the-counter cold and flu medications from [facility] were noted. During a cruise last week, her symptoms worsened, with a severe cough developing on Monday and complete loss of voice by Tuesday, lasting a week. Despite using Mucinex  and Delsym, the cough persisted without relief.  She experienced significant throat pain, making swallowing difficult, and primarily consumed soup during the cruise. The sore throat has since improved, but the cough remains. She feels tired and experiences shortness of breath when talking. A home COVID test was negative, and she has not had a fever. Migraines occurred last week, which also disrupted her sleep.  The cough is described as dry particularly bad over the weekend, though this has started to improve. She has been using Vicks and other over-the-counter remedies. No history of asthma, wheezing, or allergies to medications. No recent sinus pressure or runny nose, though drainage was present last week.         Past Medical History:  Diagnosis Date   Allergy    Arthritis    DVT (deep venous thrombosis) (HCC)    often on my LLE (06/04/2016)   Hyperlipidemia     Hypertension    Migraine    q couple months (06/04/2016)   Peripheral neuropathy    Thyroid  disease    Type 1 diabetes mellitus (HCC)    Vitamin D  deficiency     Past Surgical History:  Procedure Laterality Date   insulin  pump  2005-present   upgrades q 3 yrs (06/04/2016)    Family History  Problem Relation Age of Onset   Breast cancer Mother    Hyperlipidemia Father    Hypertension Father    Colon cancer Paternal Grandfather    Lung cancer Paternal Uncle        smoler   Liver disease Paternal Uncle     Social History   Socioeconomic History   Marital status: Married    Spouse name: Not on file   Number of children: 3   Years of education: Not on file   Highest education level: Doctorate  Occupational History   Occupation: [employer]  Tobacco Use   Smoking status: Never   Smokeless tobacco: Never  Substance and Sexual Activity   Alcohol use: No   Drug use: No   Sexual activity: Yes  Other Topics Concern   Not on file  Social History Narrative   Not on file   Social Drivers of Health   Tobacco Use: Low Risk (05/15/2024)   Patient History    Smoking Tobacco Use: Never    Smokeless Tobacco Use: Never  Passive Exposure: Not on file  Financial Resource Strain: Low Risk (05/14/2024)   Overall Financial Resource Strain (CARDIA)    Difficulty of Paying Living Expenses: Not hard at all  Food Insecurity: No Food Insecurity (05/14/2024)   Epic    Worried About Programme Researcher, Broadcasting/film/video in the Last Year: Never true    Ran Out of Food in the Last Year: Never true  Transportation Needs: No Transportation Needs (05/14/2024)   Epic    Lack of Transportation (Medical): No    Lack of Transportation (Non-Medical): No  Physical Activity: Sufficiently Active (05/14/2024)   Exercise Vital Sign    Days of Exercise per Week: 5 days    Minutes of Exercise per Session: 40 min  Stress: Stress Concern Present (05/14/2024)   [employer] of Occupational  Health - Occupational Stress Questionnaire    Feeling of Stress: To some extent  Social Connections: Moderately Integrated (05/14/2024)   Social Connection and Isolation Panel    Frequency of Communication with Friends and Family: More than three times a week    Frequency of Social Gatherings with Friends and Family: Twice a week    Attends Religious Services: Never    Database Administrator or Organizations: Yes    Attends Banker Meetings: 1 to 4 times per year    Marital Status: Married  Catering Manager Violence: Not on file  Depression (PHQ2-9): Low Risk (05/15/2024)   Depression (PHQ2-9)    PHQ-2 Score: 0  Alcohol Screen: Low Risk (05/14/2024)   Alcohol Screen    Last Alcohol Screening Score (AUDIT): 0  Housing: High Risk (05/14/2024)   Epic    Unable to Pay for Housing in the Last Year: Yes    Number of Times Moved in the Last Year: Not on file    Homeless in the Last Year: No  Utilities: Low Risk (08/22/2023)   Received from [facility]   Utilities    In the past 12 months has the electric, gas, oil, or water company threatened to shut off services in your home? : No  Health Literacy: Not on file    ROS All ROS negative except what is listed in the HPI.   Objective:   Today's Vitals: BP 128/80 (BP Location: Right Arm, Patient Position: Sitting, Cuff Size: Normal)   Pulse 76   Temp 98.1 F (36.7 C) (Oral)   Ht 4' 10 (1.473 m)   Wt 120 lb 9.6 oz (54.7 kg)   LMP 06/02/2010 Comment: pt states she has not had a period in 12-13 years  SpO2 99%   BMI 25.21 kg/m   Physical Exam Vitals reviewed.  Constitutional:      General: She is not in acute distress.    Appearance: Normal appearance. She is well-developed.  HENT:     Head: Normocephalic and atraumatic.     Right Ear: Tympanic membrane normal.     Left Ear: Tympanic membrane normal.     Mouth/Throat:     Mouth: Mucous membranes are moist.     Pharynx: Oropharynx is clear. No pharyngeal  swelling or posterior oropharyngeal erythema.  Cardiovascular:     Rate and Rhythm: Normal rate and regular rhythm.     Heart sounds: Normal heart sounds.  Pulmonary:     Effort: Pulmonary effort is normal.     Breath sounds: Normal breath sounds. No wheezing, rhonchi or rales.  Skin:    General: Skin is warm and dry.  Neurological:  Mental Status: She is alert and oriented to person, place, and time.  Psychiatric:        Mood and Affect: Mood normal.        Behavior: Behavior normal.        Thought Content: Thought content normal.        Judgment: Judgment normal.          Assessment & Plan:   Problem List Items Addressed This Visit   None Visit Diagnoses       Post-viral cough syndrome    -  Primary   Relevant Medications   chlorpheniramine-HYDROcodone  (TUSSIONEX) 10-8 MG/5ML      Assessment & Plan Post-viral cough Persistent dry cough post-viral illness, improving except for cough. Negative COVID test. - Prescribed Tussionex cough medicine - advised to monitor for drowsiness and not drive while taking. - Continue Mucinex , Delsym, and cough drops during the day. - Encouraged warm liquids, honey, and lemon. - Advised resting voice and avoiding throat clearing. - Instructed to monitor for fever, severe sinus pressure, or ear pain and contact if symptoms develop.       Follow-up: Return if symptoms worsen or fail to improve.   Waddell FURY Almarie, DNP, FNP-C  I,Emily Lagle,acting as a neurosurgeon for Waddell KATHEE Almarie, NP.,have documented all relevant documentation on the behalf of Waddell KATHEE Almarie, NP.   I, Waddell KATHEE Almarie, NP, have reviewed all documentation for this visit. The documentation on 05/15/2024 for the exam, diagnosis, procedures, and orders are all accurate and complete. "

## 2024-05-16 ENCOUNTER — Other Ambulatory Visit: Payer: Self-pay

## 2024-05-16 MED ORDER — PROMETHAZINE-DM 6.25-15 MG/5ML PO SYRP: 5.0000 mL | ORAL_SOLUTION | Freq: Four times a day (QID) | ORAL | 0 refills | Status: AC | PRN

## 2024-05-16 NOTE — Telephone Encounter (Signed)
 Spoke w/ [facility]- cancelled rx for Tussionex

## 2024-06-12 ENCOUNTER — Ambulatory Visit: Admitting: Sports Medicine

## 2024-06-12 ENCOUNTER — Ambulatory Visit: Admitting: Student in an Organized Health Care Education/Training Program

## 2024-08-15 ENCOUNTER — Encounter: Admitting: Medical
